# Patient Record
Sex: Female | Born: 1985 | Race: Black or African American | Hispanic: No | Marital: Single | Smoking: Never smoker
Health system: Southern US, Community
[De-identification: ages and names within clinical notes are randomized; demographics above are authoritative.]

## PROBLEM LIST (undated history)

## (undated) ENCOUNTER — Inpatient Hospital Stay (HOSPITAL_COMMUNITY): Payer: Self-pay

## (undated) DIAGNOSIS — A749 Chlamydial infection, unspecified: Secondary | ICD-10-CM

## (undated) DIAGNOSIS — R519 Headache, unspecified: Secondary | ICD-10-CM

## (undated) DIAGNOSIS — I10 Essential (primary) hypertension: Secondary | ICD-10-CM

## (undated) DIAGNOSIS — D649 Anemia, unspecified: Secondary | ICD-10-CM

## (undated) DIAGNOSIS — A539 Syphilis, unspecified: Secondary | ICD-10-CM

## (undated) DIAGNOSIS — A599 Trichomoniasis, unspecified: Secondary | ICD-10-CM

## (undated) DIAGNOSIS — R51 Headache: Secondary | ICD-10-CM

## (undated) HISTORY — PX: ABSCESS DRAINAGE: SHX1119

---
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
## 2005-05-17 ENCOUNTER — Emergency Department (HOSPITAL_COMMUNITY): Admission: EM | Admit: 2005-05-17 | Discharge: 2005-05-17 | Payer: Self-pay | Admitting: Emergency Medicine

## 2005-10-08 ENCOUNTER — Emergency Department (HOSPITAL_COMMUNITY): Admission: EM | Admit: 2005-10-08 | Discharge: 2005-10-08 | Payer: Self-pay | Admitting: Emergency Medicine

## 2005-10-24 ENCOUNTER — Ambulatory Visit (HOSPITAL_COMMUNITY): Admission: RE | Admit: 2005-10-24 | Discharge: 2005-10-24 | Payer: Self-pay | Admitting: General Surgery

## 2005-10-24 ENCOUNTER — Encounter (INDEPENDENT_AMBULATORY_CARE_PROVIDER_SITE_OTHER): Payer: Self-pay | Admitting: *Deleted

## 2006-03-08 ENCOUNTER — Ambulatory Visit: Payer: Self-pay | Admitting: Family Medicine

## 2006-03-27 ENCOUNTER — Emergency Department (HOSPITAL_COMMUNITY): Admission: EM | Admit: 2006-03-27 | Discharge: 2006-03-27 | Payer: Self-pay | Admitting: Emergency Medicine

## 2006-03-29 ENCOUNTER — Ambulatory Visit: Payer: Self-pay | Admitting: Family Medicine

## 2006-08-14 ENCOUNTER — Emergency Department (HOSPITAL_COMMUNITY): Admission: EM | Admit: 2006-08-14 | Discharge: 2006-08-14 | Payer: Self-pay | Admitting: Emergency Medicine

## 2006-09-25 ENCOUNTER — Emergency Department (HOSPITAL_COMMUNITY): Admission: EM | Admit: 2006-09-25 | Discharge: 2006-09-25 | Payer: Self-pay | Admitting: Emergency Medicine

## 2007-06-29 ENCOUNTER — Emergency Department (HOSPITAL_COMMUNITY): Admission: EM | Admit: 2007-06-29 | Discharge: 2007-06-29 | Payer: Self-pay | Admitting: Emergency Medicine

## 2007-12-27 ENCOUNTER — Emergency Department (HOSPITAL_COMMUNITY): Admission: EM | Admit: 2007-12-27 | Discharge: 2007-12-27 | Payer: Self-pay | Admitting: Emergency Medicine

## 2007-12-30 ENCOUNTER — Emergency Department (HOSPITAL_COMMUNITY): Admission: EM | Admit: 2007-12-30 | Discharge: 2007-12-30 | Payer: Self-pay | Admitting: Emergency Medicine

## 2008-01-23 ENCOUNTER — Emergency Department (HOSPITAL_COMMUNITY): Admission: EM | Admit: 2008-01-23 | Discharge: 2008-01-23 | Payer: Self-pay | Admitting: Family Medicine

## 2008-03-06 ENCOUNTER — Ambulatory Visit (HOSPITAL_COMMUNITY): Admission: RE | Admit: 2008-03-06 | Discharge: 2008-03-06 | Payer: Self-pay | Admitting: Obstetrics & Gynecology

## 2008-04-17 ENCOUNTER — Inpatient Hospital Stay (HOSPITAL_COMMUNITY): Admission: AD | Admit: 2008-04-17 | Discharge: 2008-04-17 | Payer: Self-pay | Admitting: Gynecology

## 2008-06-12 ENCOUNTER — Ambulatory Visit (HOSPITAL_COMMUNITY): Admission: RE | Admit: 2008-06-12 | Discharge: 2008-06-12 | Payer: Self-pay | Admitting: Family Medicine

## 2008-06-16 ENCOUNTER — Emergency Department (HOSPITAL_COMMUNITY): Admission: EM | Admit: 2008-06-16 | Discharge: 2008-06-16 | Payer: Self-pay | Admitting: Emergency Medicine

## 2008-07-02 ENCOUNTER — Emergency Department (HOSPITAL_COMMUNITY): Admission: EM | Admit: 2008-07-02 | Discharge: 2008-07-02 | Payer: Self-pay | Admitting: Emergency Medicine

## 2008-07-06 ENCOUNTER — Inpatient Hospital Stay (HOSPITAL_COMMUNITY): Admission: AD | Admit: 2008-07-06 | Discharge: 2008-07-06 | Payer: Self-pay | Admitting: Obstetrics & Gynecology

## 2008-07-11 ENCOUNTER — Ambulatory Visit: Payer: Self-pay | Admitting: Advanced Practice Midwife

## 2008-07-11 ENCOUNTER — Inpatient Hospital Stay (HOSPITAL_COMMUNITY): Admission: AD | Admit: 2008-07-11 | Discharge: 2008-07-11 | Payer: Self-pay | Admitting: Obstetrics and Gynecology

## 2008-08-07 ENCOUNTER — Ambulatory Visit: Payer: Self-pay | Admitting: Obstetrics & Gynecology

## 2008-08-07 ENCOUNTER — Ambulatory Visit: Payer: Self-pay | Admitting: Family

## 2008-08-07 ENCOUNTER — Inpatient Hospital Stay (HOSPITAL_COMMUNITY): Admission: AD | Admit: 2008-08-07 | Discharge: 2008-08-09 | Payer: Self-pay | Admitting: Obstetrics & Gynecology

## 2009-02-05 ENCOUNTER — Emergency Department (HOSPITAL_COMMUNITY): Admission: EM | Admit: 2009-02-05 | Discharge: 2009-02-05 | Payer: Self-pay | Admitting: Emergency Medicine

## 2009-03-12 ENCOUNTER — Emergency Department (HOSPITAL_COMMUNITY): Admission: EM | Admit: 2009-03-12 | Discharge: 2009-03-12 | Payer: Self-pay | Admitting: Emergency Medicine

## 2009-03-19 ENCOUNTER — Emergency Department (HOSPITAL_COMMUNITY): Admission: EM | Admit: 2009-03-19 | Discharge: 2009-03-19 | Payer: Self-pay | Admitting: Emergency Medicine

## 2009-04-16 ENCOUNTER — Emergency Department (HOSPITAL_COMMUNITY): Admission: EM | Admit: 2009-04-16 | Discharge: 2009-04-16 | Payer: Self-pay | Admitting: Emergency Medicine

## 2009-07-11 ENCOUNTER — Emergency Department (HOSPITAL_COMMUNITY): Admission: EM | Admit: 2009-07-11 | Discharge: 2009-07-11 | Payer: Self-pay | Admitting: Emergency Medicine

## 2009-07-24 ENCOUNTER — Emergency Department (HOSPITAL_COMMUNITY): Admission: EM | Admit: 2009-07-24 | Discharge: 2009-07-24 | Payer: Self-pay | Admitting: Emergency Medicine

## 2010-02-11 ENCOUNTER — Emergency Department (HOSPITAL_COMMUNITY)
Admission: EM | Admit: 2010-02-11 | Discharge: 2010-02-11 | Payer: Self-pay | Source: Home / Self Care | Admitting: Emergency Medicine

## 2010-02-24 ENCOUNTER — Emergency Department (HOSPITAL_COMMUNITY): Admission: EM | Admit: 2010-02-24 | Discharge: 2010-02-24 | Payer: Self-pay | Admitting: Emergency Medicine

## 2010-08-14 ENCOUNTER — Encounter: Payer: Self-pay | Admitting: Family Medicine

## 2010-09-07 ENCOUNTER — Emergency Department (HOSPITAL_COMMUNITY)
Admission: EM | Admit: 2010-09-07 | Discharge: 2010-09-07 | Disposition: A | Discharge status: Home / Self Care | Payer: Medicaid Other | Attending: Emergency Medicine | Admitting: Emergency Medicine

## 2010-09-07 DIAGNOSIS — M545 Low back pain, unspecified: Secondary | ICD-10-CM | POA: Insufficient documentation

## 2010-09-07 DIAGNOSIS — N390 Urinary tract infection, site not specified: Secondary | ICD-10-CM | POA: Insufficient documentation

## 2010-09-07 LAB — URINALYSIS, ROUTINE W REFLEX MICROSCOPIC
Ketones, ur: NEGATIVE mg/dL
Nitrite: NEGATIVE
Protein, ur: NEGATIVE mg/dL
Specific Gravity, Urine: 1.03 (ref 1.005–1.030)
Urine Glucose, Fasting: NEGATIVE mg/dL
Urobilinogen, UA: 0.2 mg/dL (ref 0.0–1.0)
pH: 6 (ref 5.0–8.0)

## 2010-09-07 LAB — URINE MICROSCOPIC-ADD ON

## 2010-09-07 LAB — POCT PREGNANCY, URINE: Preg Test, Ur: NEGATIVE

## 2010-10-07 LAB — POCT PREGNANCY, URINE: Preg Test, Ur: NEGATIVE

## 2010-10-08 LAB — POCT PREGNANCY, URINE: Preg Test, Ur: NEGATIVE

## 2010-10-08 LAB — POCT URINALYSIS DIP (DEVICE)
Bilirubin Urine: NEGATIVE
Glucose, UA: NEGATIVE mg/dL
Hgb urine dipstick: NEGATIVE
Ketones, ur: NEGATIVE mg/dL
Nitrite: POSITIVE — AB
Protein, ur: NEGATIVE mg/dL
Specific Gravity, Urine: 1.025 (ref 1.005–1.030)
Urobilinogen, UA: 0.2 mg/dL (ref 0.0–1.0)
pH: 6.5 (ref 5.0–8.0)

## 2010-10-08 LAB — POCT RAPID STREP A (OFFICE): Streptococcus, Group A Screen (Direct): NEGATIVE

## 2010-10-19 ENCOUNTER — Emergency Department (HOSPITAL_COMMUNITY)
Admission: EM | Admit: 2010-10-19 | Discharge: 2010-10-19 | Disposition: A | Discharge status: Home / Self Care | Payer: Medicaid Other | Attending: Emergency Medicine | Admitting: Emergency Medicine

## 2010-10-19 DIAGNOSIS — X58XXXA Exposure to other specified factors, initial encounter: Secondary | ICD-10-CM | POA: Insufficient documentation

## 2010-10-19 DIAGNOSIS — T148XXA Other injury of unspecified body region, initial encounter: Secondary | ICD-10-CM | POA: Insufficient documentation

## 2010-10-19 DIAGNOSIS — M549 Dorsalgia, unspecified: Secondary | ICD-10-CM | POA: Insufficient documentation

## 2010-10-19 LAB — URINALYSIS, ROUTINE W REFLEX MICROSCOPIC
Ketones, ur: NEGATIVE mg/dL
Nitrite: NEGATIVE
Protein, ur: NEGATIVE mg/dL
Specific Gravity, Urine: 1.014 (ref 1.005–1.030)
pH: 6.5 (ref 5.0–8.0)

## 2010-10-19 LAB — POCT PREGNANCY, URINE: Preg Test, Ur: NEGATIVE

## 2010-10-28 LAB — RAPID STREP SCREEN (MED CTR MEBANE ONLY): Streptococcus, Group A Screen (Direct): NEGATIVE

## 2010-11-07 LAB — COMPREHENSIVE METABOLIC PANEL
ALT: 10 U/L (ref 0–35)
AST: 21 U/L (ref 0–37)
Albumin: 2.5 g/dL — ABNORMAL LOW (ref 3.5–5.2)
Alkaline Phosphatase: 164 U/L — ABNORMAL HIGH (ref 39–117)
BUN: 7 mg/dL (ref 6–23)
CO2: 21 mEq/L (ref 19–32)
Calcium: 8.7 mg/dL (ref 8.4–10.5)
Chloride: 107 mEq/L (ref 96–112)
Creatinine, Ser: 0.53 mg/dL (ref 0.4–1.2)
GFR calc Af Amer: >60 mL/min (ref >60)
GFR calc non Af Amer: >60 mL/min (ref >60)
Glucose, Bld: 91 mg/dL (ref 70–99)
Potassium: 3.5 mEq/L (ref 3.5–5.1)
Sodium: 133 mEq/L — ABNORMAL LOW (ref 135–145)
Total Bilirubin: 0.2 mg/dL — ABNORMAL LOW (ref 0.3–1.2)
Total Protein: 6.4 g/dL (ref 6.0–8.3)

## 2010-11-07 LAB — CBC
HCT: 27.2 % — ABNORMAL LOW (ref 36.0–46.0)
HCT: 24 % — ABNORMAL LOW (ref 36.0–46.0)
Hemoglobin: 8.6 g/dL — ABNORMAL LOW (ref 12.0–15.0)
Hemoglobin: 7.8 g/dL — CL (ref 12.0–15.0)
MCHC: 31.5 g/dL (ref 30.0–36.0)
MCHC: 32.5 g/dL (ref 30.0–36.0)
MCV: 74.6 fL — ABNORMAL LOW (ref 78.0–100.0)
MCV: 74.8 fL — ABNORMAL LOW (ref 78.0–100.0)
Platelets: 350 10*3/uL (ref 150–400)
Platelets: 310 10*3/uL (ref 150–400)
RBC: 3.64 MIL/uL — ABNORMAL LOW (ref 3.87–5.11)
RBC: 3.21 MIL/uL — ABNORMAL LOW (ref 3.87–5.11)
RDW: 20.3 % — ABNORMAL HIGH (ref 11.5–15.5)
RDW: 20.6 % — ABNORMAL HIGH (ref 11.5–15.5)
WBC: 8.4 10*3/uL (ref 4.0–10.5)
WBC: 14.5 10*3/uL — ABNORMAL HIGH (ref 4.0–10.5)

## 2010-11-07 LAB — URINALYSIS, DIPSTICK ONLY
Bilirubin Urine: NEGATIVE
Glucose, UA: NEGATIVE mg/dL
Ketones, ur: NEGATIVE mg/dL
Nitrite: NEGATIVE
Protein, ur: NEGATIVE mg/dL
Specific Gravity, Urine: 1.01 (ref 1.005–1.030)
Urobilinogen, UA: 0.2 mg/dL (ref 0.0–1.0)
pH: 5.5 (ref 5.0–8.0)

## 2010-11-07 LAB — URIC ACID: Uric Acid, Serum: 5.4 mg/dL (ref 2.4–7.0)

## 2010-11-07 LAB — RPR: RPR Ser Ql: NONREACTIVE

## 2010-12-05 ENCOUNTER — Other Ambulatory Visit (HOSPITAL_COMMUNITY): Payer: Self-pay | Admitting: Internal Medicine

## 2010-12-09 ENCOUNTER — Ambulatory Visit (HOSPITAL_COMMUNITY)
Admission: RE | Admit: 2010-12-09 | Discharge: 2010-12-09 | Disposition: A | Discharge status: Home / Self Care | Payer: Medicaid Other | Source: Ambulatory Visit | Attending: Internal Medicine | Admitting: Internal Medicine

## 2010-12-09 DIAGNOSIS — R109 Unspecified abdominal pain: Secondary | ICD-10-CM | POA: Insufficient documentation

## 2010-12-09 NOTE — Discharge Summary (Signed)
NAME:  Amber Sanders, Amber Sanders                        ACCOUNT NO.:  [id number]   MEDICAL RECORD NO.:  [id number]                   PATIENT TYPE:  INP   LOCATION:  5736                                 FACILITY:  [facility]   PHYSICIAN:  Foye Clock, MD                   DATE OF BIRTH:  1986-06-23   DATE OF ADMISSION:  07/31/2003  DATE OF DISCHARGE:  08/05/2003                                 DISCHARGE SUMMARY   DISCHARGE DIAGNOSIS:  Gastroenteritis most likely secondary to [place]  virus.   DISCHARGE MEDICATIONS:  1. Mylanta, please use as directed for nausea.  2. Tylenol over the counter use as directed for any abdominal pain.  3. Phenergan 12.5 mg one p.o. q. 4 hours p.r.n. nausea.   PROCEDURES PERFORMED:  1. Abdominal CT done on August 01, 2003, which showed normal anatomy, no     evidence of any abnormalities.   CONSULTANTS:  None.   In brief, patient's history and physical exam is as follows.  Please see the  full H&P on the patient's medical record for all details.  But in brief, Ms.  Sanders is a 25 year old female with no past medical history who presented  initially to the emergency department on July 28, 2003, which is 3 days  prior to this admission with severe nausea and vomiting which began abruptly  that same day.  Since then the nausea and vomiting has persisted throughout  this time.  She has had no frank abdominal pain or any bowel abnormalities.  She has been vomiting she says at least 15 times.  She is unable to keep  down solids and because of this vomiting she has had increased fatigue and  weakness with some subjective fever.  Patient denies any sick contacts.  Has  had a normal bowel movement she states 3 days ago.  No dysuria, no vaginal  discharge.   PHYSICAL EXAMINATION:  VITAL SIGNS:  Temperature 98.1, pulse 72, respiratory  rate 16, blood pressure 132/72 lying down with a pulse as mentioned before  72, and sitting 138/70 with pulse of 70, and standing 120/95  with pulse of  94, and saturating 98% on room air.  GENERAL:  She is very weak, lying on her side, occasionally moaning and  gagging.  EYES:  Pupils equal, round, reactive to light and accommodation.  Extraocular movements are intact.  ENT:  Oropharynx clear.  Moist mucosal membranes.  NECK:  Supple with no lymphadenopathy.  RESPIRATORY:  Clear to auscultation bilaterally.  No crackles, no wheezes.  CV:  She has regular rate and rhythm.  No murmurs, rubs, or gallops.  GI:  Tender to the slightest palpation throughout.  Some right-sided  abdominal tenderness greater than left-sided.  Positive bowel sounds.  No  rebound.  EXTREMITIES:  No edema.  SKIN:  No rashes, no lesions, no bruises.  Patient has positive CVA  tenderness bilaterally.  Otherwise  she has normal LSK, able to move all  extremities.  NEURO:  She has no gross abnormalities.   ADMISSION LABORATORIES:  Sodium 140, potassium 2.9, chloride 101, CO2 28,  BUN 20, creatinine 0.8, glucose 84, calcium 9.7.  White blood cell count  11.1, hemoglobin 12.7, platelet count 312.  UA showed trace leukocyte  esterase and 30 mg/dL of protein with greater than 80 ketones, a few  bacteria, and 30-60 white blood cells.  Wet prep showed positive clue cells,  negative for any other abnormalities.  Urine pregnancy was negative.   HOSPITAL COURSE:  Problem 1.  Nausea and vomiting, most likely secondary to  gastroenteritis from [place] virus.  Abdominal CT was checked which was  found to be normal documenting that her nausea and vomiting was not  secondary to any structural abnormalities.  Patient was admitted for  supportive care where she was given IV fluids, made NPO until able to  tolerate fluids.  Because there was some tenderness to palpation on exam  that was concentrated on the right side, questionable if it was greater in  the lower quadrant, Surgery was consulted and Dr. Derrell Lolling came to see the  patient after getting the results of the  CT that did not show any evidence  of pancreatitis or any other surgical emergencies, Dr. Derrell Lolling followed the  patient for one day but decided it was okay for the patient to eat.  Once  the patient was tolerating liquids, Dr. Derrell Lolling decided patient did not  require any surgical intervention, therefore would not continue to see the  patient at that point.  We, however, her primary team, decided to continue  on with supportive care and she continued to progress her diet throughout  the hospital course and was continued also with IV fluids.  The day of  discharge the patient had been asymptomatic for 24 hours, tolerating solid  p.o.'s, and was up ambulating without difficulty.  The patient stated that  she was ready to go on the day of discharge and therefore it was felt that  her issues had completely resolved.   Problem 2.  Hypokalemia.  This is most likely secondary to the vomiting.  Patient's potassium was repleted IV and p.o. intake, and was resolved by the  time patient was discharged.   Problem 3.  Diarrhea.  During the hospital course the patient developed  diarrhea.  Patient had not been started on any antibiotics, however, labs  were checked which included a C. diff which was negative.  Patient was  treated symptomatically with Imodium.  However, because of the diarrhea was  thought to be secondary to her gastroenteritis and just supportive care was  given.  Patient's diarrhea had resolved by the time of discharge and no  further intervention was required.   DISCHARGE LABORATORIES:  Sodium 137, potassium 3.9, Cl 106, CO2 27, BUN 6,  creatinine 0.7, glucose 87, calcium 8.9.  Liver enzymes were normal.   DISPOSITION:  Patient was much improved at the time of discharge.  Patient  was given a note to take back to her school excusing her from July 29, 2003, through August 05, 2003, for her absence secondary to her illness.  Patient was also given prescription for Phenergan 12.5 mg  p.o. q. 4 hours  p.r.n. nausea to take for symptomatic relief.  Patient was also encouraged  to use any over the counter medicine that might give her some direct relief  including Mylanta, which had proven to  be effective during the  hospitalization and Tylenol, which proved to help with her abdominal pain  secondary to her vomiting.  Because the patient is a normally healthy female  without any past medical history, it is recommended that the patient follow  up with her pediatrician annually as she had been doing, if she has any  acute problems or just her regular visits.                                                Foye Clock, MD    JH/MEDQ  D:  08/05/2003  T:  08/06/2003  Job:  [id number]

## 2010-12-09 NOTE — Op Note (Signed)
NAMEXOIE, KREUSER              ACCOUNT NO.:  [id number]   MEDICAL RECORD NO.:  [id number]          PATIENT TYPE:  AMB   LOCATION:  DAY                          FACILITY:  [facility]   PHYSICIAN:  Timothy E. Earlene Plater, M.D. DATE OF BIRTH:  06/23/1986   DATE OF PROCEDURE:  10/24/2005  DATE OF DISCHARGE:                                 OPERATIVE REPORT   PREOPERATIVE DIAGNOSIS:  Bilateral chronic axillary hidradenitis.   POSTOPERATIVE DIAGNOSIS:  Bilateral chronic axillary hidradenitis.   PROCEDURE:  Bilateral axillary debridement, incision, drainage and  culturing.   SURGEON:  Timothy E. Earlene Plater, M.D.   ANESTHESIA:  General.   Ms. Sliker is a 25 year old [place] woman with chronic hidradenitis,  bilateral.  The patient does not remember when she did not have draining  sores in the underarms.  She was referred to my office this past month from  the [facility].  She is 10 months postpartum and she feels as  though the drainage, odor and pain are getting worse.  She has been seen in  the office.  She had a light debridement and cultures made, put on  antibiotics, first doxycycline and then Keflex.  Cultures did not show MRSA.  She is prepared for this surgery and understands.  It is an unfortunate  patient and a complicated problem.  She was seen, identified, and the permit  signed.   She was taken to the operating room and placed supine, general endotracheal  anesthesia administered.  Both arms were gently outstretched and the entire  chest and axillae, neck and arms were prepped and draped in the usual  fashion.  I approached first the right and then the left and both axillae  were treated in similar fashions.  The superficial granulation tissue was  scraped away, tunnels fistulae were defined.  The chronic inflamed skin  overlying the largest of the fistulae was incised.  There was abundance of  pustular and waxy sebaceous-type material under the skin bridges.  I did  preserve as much normal skin as possible, and I cauterized the areas that  were oozing.  Cultures were made from each axilla separately and the skin  excisions were submitted separately.   Finally the wounds were under control.  A Vaseline gauze and then Ace wrap  were placed for security.  She tolerated it well, counts correct.  No  complications.   Written and verbal instructions given.  She is to continue her Keflex, which  she has, Vicodin #48 were given for pain.  We will see case worker and home  health care for assistance, and she will be followed closely in the office.      Timothy E. Earlene Plater, M.D.  Electronically Signed     TED/MEDQ  D:  10/24/2005  T:  10/25/2005  Job:  [id number]

## 2010-12-09 NOTE — Consult Note (Signed)
NAME:  Amber Sanders, Amber Sanders                        ACCOUNT NO.:  [id number]   MEDICAL RECORD NO.:  [id number]                   PATIENT TYPE:  INP   LOCATION:  6703                                 FACILITY:  [facility]   PHYSICIAN:  Angelia Mould. Derrell Lolling, M.D.             DATE OF BIRTH:  1986-04-25   DATE OF CONSULTATION:  07/31/2003  DATE OF DISCHARGE:                                   CONSULTATION   REFERRING PHYSICIAN:  Cliffton Asters, M.D.   REASON FOR CONSULTATION:  Evaluate abdominal pain and vomiting.   HISTORY OF PRESENT ILLNESS:  This is a 25 year old black female who reports  a four-day history of diffuse abdominal pain and repeated episodes of nausea  and vomiting.  After breakfast on Tuesday, four days ago, she started  vomiting and very shortly after that developed diffuse abdominal pain.  She  states that the pain has never been localizing.  It has been continuous  since that.  She has been vomiting every day.  She went to the [facility]  Emergency Room on Tuesday, July 28, 2003.  She states she had x-rays and  laboratories and was given a prescription for Phenergan and went home.  She  continued to be ill with diffuse pain and vomiting.  She came back today and  was admitted by the medical teaching service.   It is notable that she has been constipated.  She has not had any diarrhea.  She has not seen any blood in her stools or in her emesis.  She has had no  menstrual irregularities.  She is currently on her menstrual period now and  has not had any irregularities.  She has had no urinary frequency or  dysuria.   PAST MEDICAL HISTORY:  She has no medical or surgical problems.   MEDICATIONS:  She takes no medications.   ALLERGIES:  She has no drug allergies.   REVIEW OF SYSTEMS:  HEENT:  She does admit to a sore throat, but denies  earache or nose bleed.  CARDIOVASCULAR:  She does have a little bit of  shortness of breath, but denies orthopnea, edema, syncope, or palpitations.  RESPIRATORY:  She has had a little bit of a cough and sputum production  recently, but not much.  GASTROINTESTINAL:  Please see the history of  present illness.   PHYSICAL EXAMINATION:  GENERAL APPEARANCE:  A pleasant, healthy-appearing,  young, black female in moderate distress.  She has a Panda tube in place,  receiving contrast for her CT scan.  She is gagging a lot, but is not  vomiting.  VITAL SIGNS:  The temperature is 98.1 degrees, blood pressure 131/72, pulse  72, and respiratory rate 16.  NECK:  Supple.  No adenopathy.  No mass.  No jugular venous distention.  No  tenderness.  RESPIRATORY:  Lungs are clear to auscultation.  She does have tenderness to  percussion in both of her costovertebral  angles, however.  HEART:  Regular rate and rhythm.  No murmur.  ABDOMEN:  She is not distended.  Her abdomen is soft, but she has diffuse  tenderness throughout.  Bowel sounds are present, but hypoactive.  There is  no guarding or rebound.  I do not think she has any peritoneal signs.  The  liver and spleen are not enlarged.  I do not feel any abdominal mass nor do  I feel a hernia.  PELVIC:  Not repeated.  Earlier today it was said to be normal, except for  clue cells.  There was no discharge or notable cervical motion tenderness.  EXTREMITIES:  She moves all four extremities well without pain or deformity.   LABORATORY WORK:  Hemoglobin 12.7, white count 11,100.  Comprehensive  metabolic panel normal, except for a potassium of 2.5.  Urine pregnancy test  negative.   ASSESSMENT:  Diffuse abdominal pain and vomiting, etiology unclear.  The  long duration of her pain and the diffuse nature of her pain are somewhat  atypical for appendicitis.  I wonder whether she has some type of viral  syndrome, gynecologic problem, or more obscure GI disease.   PLAN:  1. There is no indication for surgical intervention immediately.  2. I agree with careful workup, including CT scan of the abdomen and  pelvis     to rule out focal inflammatory process.  3. I will follow along with you.                                               Angelia Mould. Derrell Lolling, M.D.    HMI/MEDQ  D:  07/31/2003  T:  08/01/2003  Job:  [id number]

## 2011-02-02 ENCOUNTER — Emergency Department (HOSPITAL_COMMUNITY): Payer: Medicaid Other

## 2011-02-02 ENCOUNTER — Emergency Department (HOSPITAL_COMMUNITY)
Admission: EM | Admit: 2011-02-02 | Discharge: 2011-02-02 | Disposition: A | Discharge status: Home / Self Care | Payer: Medicaid Other | Attending: Emergency Medicine | Admitting: Emergency Medicine

## 2011-02-02 DIAGNOSIS — R1012 Left upper quadrant pain: Secondary | ICD-10-CM | POA: Insufficient documentation

## 2011-02-02 DIAGNOSIS — M549 Dorsalgia, unspecified: Secondary | ICD-10-CM | POA: Insufficient documentation

## 2011-02-02 DIAGNOSIS — R109 Unspecified abdominal pain: Secondary | ICD-10-CM | POA: Insufficient documentation

## 2011-02-02 LAB — URINALYSIS, ROUTINE W REFLEX MICROSCOPIC
Bilirubin Urine: NEGATIVE
Specific Gravity, Urine: 1.025 (ref 1.005–1.030)
pH: 6 (ref 5.0–8.0)

## 2011-02-02 LAB — CBC
MCHC: 32.4 g/dL (ref 30.0–36.0)
RDW: 15.2 % (ref 11.5–15.5)
WBC: 9.1 10*3/uL (ref 4.0–10.5)

## 2011-02-02 LAB — URINE MICROSCOPIC-ADD ON

## 2011-02-02 LAB — COMPREHENSIVE METABOLIC PANEL
ALT: 17 U/L (ref 0–35)
AST: 18 U/L (ref 0–37)
Albumin: 3.1 g/dL — ABNORMAL LOW (ref 3.5–5.2)
Alkaline Phosphatase: 103 U/L (ref 39–117)
Chloride: 105 mEq/L (ref 96–112)
Potassium: 4 mEq/L (ref 3.5–5.1)
Sodium: 138 mEq/L (ref 135–145)
Total Bilirubin: 0.3 mg/dL (ref 0.3–1.2)
Total Protein: 7.5 g/dL (ref 6.0–8.3)

## 2011-02-02 LAB — DIFFERENTIAL
Basophils Absolute: 0 10*3/uL (ref 0.0–0.1)
Basophils Relative: 0 % (ref 0–1)
Eosinophils Relative: 3 % (ref 0–5)
Lymphocytes Relative: 22 % (ref 12–46)
Monocytes Absolute: 0.3 10*3/uL (ref 0.1–1.0)
Neutro Abs: 6.6 10*3/uL (ref 1.7–7.7)

## 2011-02-02 LAB — GLUCOSE, CAPILLARY: Glucose-Capillary: 88 mg/dL (ref 70–99)

## 2011-04-20 LAB — URINE CULTURE: Colony Count: 80000

## 2011-04-20 LAB — POCT URINALYSIS DIP (DEVICE)
Glucose, UA: NEGATIVE
Nitrite: NEGATIVE
Operator id: 247071
pH: 7

## 2011-04-20 LAB — POCT PREGNANCY, URINE
Operator id: 247071
Preg Test, Ur: POSITIVE

## 2011-04-24 LAB — URINE CULTURE: Colony Count: >=100000

## 2011-04-24 LAB — URINALYSIS, ROUTINE W REFLEX MICROSCOPIC
Bilirubin Urine: NEGATIVE
Glucose, UA: NEGATIVE
Protein, ur: NEGATIVE

## 2011-04-24 LAB — URINE MICROSCOPIC-ADD ON

## 2011-04-24 LAB — CBC
MCV: 85.2
RBC: 3.59 — ABNORMAL LOW
WBC: 12.1 — ABNORMAL HIGH

## 2011-04-28 LAB — CBC
MCHC: 31.8 g/dL (ref 30.0–36.0)
RDW: 18.1 % — ABNORMAL HIGH (ref 11.5–15.5)

## 2011-04-28 LAB — DIFFERENTIAL
Eosinophils Absolute: 0.2 10*3/uL (ref 0.0–0.7)
Eosinophils Relative: 2 % (ref 0–5)
Lymphs Abs: 2.1 10*3/uL (ref 0.7–4.0)
Monocytes Relative: 5 % (ref 3–12)

## 2011-04-28 LAB — URINALYSIS, ROUTINE W REFLEX MICROSCOPIC
Glucose, UA: NEGATIVE mg/dL
Hgb urine dipstick: NEGATIVE
Ketones, ur: NEGATIVE mg/dL
Protein, ur: NEGATIVE mg/dL

## 2011-04-28 LAB — COMPREHENSIVE METABOLIC PANEL
ALT: 9 U/L (ref 0–35)
AST: 18 U/L (ref 0–37)
Calcium: 9.2 mg/dL (ref 8.4–10.5)
GFR calc Af Amer: >60 mL/min (ref >60)
Sodium: 137 mEq/L (ref 135–145)
Total Protein: 6.1 g/dL (ref 6.0–8.3)

## 2011-06-10 ENCOUNTER — Emergency Department (HOSPITAL_COMMUNITY)
Admission: EM | Admit: 2011-06-10 | Discharge: 2011-06-10 | Disposition: A | Discharge status: Home / Self Care | Payer: Self-pay | Attending: Emergency Medicine | Admitting: Emergency Medicine

## 2011-06-10 ENCOUNTER — Encounter: Payer: Self-pay | Admitting: *Deleted

## 2011-06-10 DIAGNOSIS — R51 Headache: Secondary | ICD-10-CM | POA: Insufficient documentation

## 2011-06-10 DIAGNOSIS — R05 Cough: Secondary | ICD-10-CM | POA: Insufficient documentation

## 2011-06-10 DIAGNOSIS — R059 Cough, unspecified: Secondary | ICD-10-CM | POA: Insufficient documentation

## 2011-06-10 DIAGNOSIS — R6889 Other general symptoms and signs: Secondary | ICD-10-CM | POA: Insufficient documentation

## 2011-06-10 DIAGNOSIS — J3489 Other specified disorders of nose and nasal sinuses: Secondary | ICD-10-CM | POA: Insufficient documentation

## 2011-06-10 MED ORDER — IBUPROFEN 800 MG PO TABS: 800.0000 mg | ORAL_TABLET | Freq: Three times a day (TID) | ORAL | Status: AC

## 2011-06-10 MED ORDER — SALINE NASAL SPRAY 0.65 % NA SOLN: 1.0000 | NASAL | Status: DC | PRN

## 2011-06-10 MED ORDER — OXYMETAZOLINE HCL 0.05 % NA SOLN: 2.0000 | Freq: Two times a day (BID) | NASAL | Status: AC

## 2011-06-10 MED ORDER — KETOROLAC TROMETHAMINE 30 MG/ML IJ SOLN
30.0000 mg | Freq: Once | INTRAMUSCULAR | Status: AC
  Administered 2011-06-10: 30 mg via INTRAMUSCULAR
  Filled 2011-06-10: qty 1

## 2011-06-10 NOTE — ED Notes (Signed)
Pt has had HA for the past 2 days, pt states no history of migraines. Pt states that the pain is right sided. Pt denies sensitivity to light. Pt states that she has been taking Ibuprofen with no relief.

## 2011-06-10 NOTE — ED Provider Notes (Signed)
History     CSN: [id number] Arrival date & time: 06/10/2011  6:44 PM   First MD Initiated Contact with Patient 06/10/11 1957      Chief Complaint  Patient presents with  . Headache    (Consider location/radiation/quality/duration/timing/severity/associated sxs/prior treatment) Patient is a 25 y.o. female presenting with headaches. The history is provided by the patient.  Headache  This is a new problem. The current episode started 12 to 24 hours ago. The problem occurs constantly. The problem has not changed since onset.The headache is associated with nothing. The pain is located in the right unilateral region. The pain does not radiate. Pertinent negatives include no fever, no nausea and no vomiting. She has tried nothing for the symptoms.  Recent URI sx for the past several days. No fevers. Denies hx of migraine/headaches.  History reviewed. No pertinent past medical history.  History reviewed. No pertinent past surgical history.  History reviewed. No pertinent family history.  History  Substance Use Topics  . Smoking status: Never Smoker   . Smokeless tobacco: Not on file  . Alcohol Use: No    OB History    Grav Para Term Preterm Abortions TAB SAB Ect Mult Living                  Review of Systems  Constitutional: Negative for fever, chills and appetite change.  HENT: Positive for congestion, rhinorrhea, sneezing and sinus pressure. Negative for sore throat, facial swelling, neck pain, neck stiffness, postnasal drip and tinnitus.   Eyes: Negative for photophobia, pain and visual disturbance.  Respiratory: Positive for cough.   Cardiovascular: Negative for chest pain.  Gastrointestinal: Negative for nausea, vomiting and abdominal pain.  Musculoskeletal: Negative for myalgias.  Skin: Negative for color change and rash.  Neurological: Positive for headaches. Negative for dizziness, weakness and numbness.    Allergies  Review of patient's allergies indicates no  known allergies.  Home Medications  No current outpatient prescriptions on file.  BP 144/86  Pulse 73  Temp(Src) 97.8 F (36.6 C) (Oral)  Resp 18  SpO2 99%  Physical Exam  Nursing note and vitals reviewed. Constitutional: She is oriented to person, place, and time. She appears well-developed and well-nourished. No distress.  HENT:  Head: Normocephalic and atraumatic.  Right Ear: Tympanic membrane and external ear normal.  Left Ear: Tympanic membrane and external ear normal.  Mouth/Throat: Oropharynx is clear and moist.  Eyes: Conjunctivae and EOM are normal. Pupils are equal, round, and reactive to light.  Fundoscopic exam:      The right eye shows no hemorrhage and no papilledema.       The left eye shows no hemorrhage and no papilledema.  Neck: Normal range of motion.  Cardiovascular: Normal rate and regular rhythm.   Pulmonary/Chest: Effort normal and breath sounds normal.  Abdominal: Soft. Bowel sounds are normal. There is no tenderness.  Musculoskeletal: Normal range of motion.  Neurological: She is alert and oriented to person, place, and time. No cranial nerve deficit. Coordination normal.  Skin: Skin is warm and dry. No rash noted. She is not diaphoretic.  Psychiatric: She has a normal mood and affect.    ED Course  Procedures (including critical care time)  Labs Reviewed - No data to display No results found.   1. Sinus headache       MDM  Suspect this is most likely related to URI sx. Pt got some relief with toradol. Will send home with rx for ibuprofen as  well as Afrin to help with congestion.        Grant Fontana, [place] 06/11/11 2232

## 2011-06-10 NOTE — ED Notes (Signed)
C/o right sided headache that started around 3am and now she feels dizzy.   Neuro intact.

## 2011-06-12 NOTE — ED Provider Notes (Signed)
Medical screening examination/treatment/procedure(s) were performed by non-physician practitioner and as supervising physician I was immediately available for consultation/collaboration.  Flint Melter, MD 06/12/11 [phone]

## 2011-06-26 ENCOUNTER — Emergency Department (HOSPITAL_COMMUNITY)
Admission: EM | Admit: 2011-06-26 | Discharge: 2011-06-26 | Disposition: A | Discharge status: Home / Self Care | Payer: Self-pay | Attending: Emergency Medicine | Admitting: Emergency Medicine

## 2011-06-26 ENCOUNTER — Encounter (HOSPITAL_COMMUNITY): Payer: Self-pay

## 2011-06-26 DIAGNOSIS — M5430 Sciatica, unspecified side: Secondary | ICD-10-CM | POA: Insufficient documentation

## 2011-06-26 DIAGNOSIS — S335XXA Sprain of ligaments of lumbar spine, initial encounter: Secondary | ICD-10-CM | POA: Insufficient documentation

## 2011-06-26 DIAGNOSIS — X500XXA Overexertion from strenuous movement or load, initial encounter: Secondary | ICD-10-CM | POA: Insufficient documentation

## 2011-06-26 DIAGNOSIS — M5380 Other specified dorsopathies, site unspecified: Secondary | ICD-10-CM | POA: Insufficient documentation

## 2011-06-26 DIAGNOSIS — S39012A Strain of muscle, fascia and tendon of lower back, initial encounter: Secondary | ICD-10-CM

## 2011-06-26 MED ORDER — PREDNISONE 50 MG PO TABS: 50.0000 mg | ORAL_TABLET | Freq: Every day | ORAL | Status: AC

## 2011-06-26 MED ORDER — HYDROCODONE-ACETAMINOPHEN 5-325 MG PO TABS: 1.0000 | ORAL_TABLET | Freq: Four times a day (QID) | ORAL | Status: AC | PRN

## 2011-06-26 MED ORDER — DIAZEPAM 5 MG PO TABS: 5.0000 mg | ORAL_TABLET | Freq: Three times a day (TID) | ORAL | Status: AC | PRN

## 2011-06-26 NOTE — ED Notes (Signed)
No complaints at present. Voices understanding of instructions given. Walked to check out window.  

## 2011-06-26 NOTE — ED Provider Notes (Signed)
Medical screening examination/treatment/procedure(s) were performed by non-physician practitioner and as supervising physician I was immediately available for consultation/collaboration.   Lyanne Co, MD 06/26/11 [phone]

## 2011-06-26 NOTE — ED Provider Notes (Signed)
History     CSN: [id number] Arrival date & time: 06/26/2011 10:47 AM   First MD Initiated Contact with Patient 06/26/11 1128      Chief Complaint  Patient presents with  . Back Pain    (Consider location/radiation/quality/duration/timing/severity/associated sxs/prior treatment) Patient is a 25 y.o. female presenting with back pain. The history is provided by the patient.  Back Pain  Pertinent negatives include no chest pain, no fever, no numbness, no abdominal pain, no bowel incontinence, no perianal numbness, no bladder incontinence, no dysuria, no pelvic pain, no paresthesias, no tingling and no weakness.   Patient states, that on Friday.  She was bending over to pick up her son's jacket and when she stood back up her back pop, and she has had spasm and pain in her right lower back since that time.  She states the pain radiates to her right lower extremity.  She denies incontinence of bowel or bladder, numbness, abdominal pain, vaginal discharge or weakness of her legs.  The patient does not have any urinary symptoms. Patient states, that she has had this happen in the past with muscle spasms in her lower back.  Patient denies any inability to walk.       History reviewed. No pertinent past medical history.  History reviewed. No pertinent past surgical history.  No family history on file.  History  Substance Use Topics  . Smoking status: Never Smoker   . Smokeless tobacco: Not on file  . Alcohol Use: No    OB History    Grav Para Term Preterm Abortions TAB SAB Ect Mult Living                  Review of Systems  Constitutional: Negative for fever.  Cardiovascular: Negative for chest pain.  Gastrointestinal: Negative for abdominal pain and bowel incontinence.  Genitourinary: Negative for bladder incontinence, dysuria and pelvic pain.  Musculoskeletal: Positive for back pain.  Neurological: Negative for tingling, weakness, numbness and paresthesias.   All pertinent  positives and negatives in the history of present illness  Allergies  Review of patient's allergies indicates no known allergies.  Home Medications   Current Outpatient Rx  Name Route Sig Dispense Refill  . SALINE NASAL SPRAY 0.65 % NA SOLN Nasal Place 1 spray into the nose as needed for congestion. 30 mL 0    BP 119/79  Pulse 85  Temp(Src) 98.1 F (36.7 C) (Oral)  Resp 18  Ht 5\' 6"  (1.676 m)  Wt 270 lb (122.471 kg)  BMI 43.58 kg/m2  SpO2 100%  LMP 06/26/2011  Physical Exam  Constitutional: She appears well-nourished. No distress.  HENT:  Head: Normocephalic and atraumatic.  Eyes: Pupils are equal, round, and reactive to light.  Neck: Normal range of motion. Neck supple.  Cardiovascular: Normal rate, regular rhythm and normal heart sounds.   Pulmonary/Chest: Effort normal and breath sounds normal. She has no wheezes. She has no rales.  Musculoskeletal:       Lumbar back: She exhibits spasm. She exhibits no deformity.       Back:  Neurological: She is alert. She has normal strength. She displays normal reflexes. No sensory deficit. Coordination and gait normal.  Reflex Scores:      Patellar reflexes are 2+ on the right side.      Achilles reflexes are 2+ on the right side.      Patient is able to ambulate without difficulty    ED Course  Procedures (including critical care  time)   Patient has no neurological deficits and normal reflexes on examination.  She will be given pain control and advised to use ice and heat are lower back.  She is told to return here for any worsening in her condition.  This appears to be lumbar strain with sciatic  -type features.  The patient had no significant trauma therefore no x-rays were obtained due to this fact.  MDM  See above        Carlyle Dolly, PA 06/26/11 1150

## 2011-06-26 NOTE — ED Notes (Signed)
Patient reports lower backpain that started on Friday after picking up childs coat off ground, pain with any change in position

## 2011-06-26 NOTE — ED Notes (Signed)
States that she leaned over to pick up her son's coat off the floor and felt a pulling sensation in her back. States that the pain in gradually worse with pains shooting down her leg.

## 2011-09-15 ENCOUNTER — Emergency Department (HOSPITAL_COMMUNITY)
Admission: EM | Admit: 2011-09-15 | Discharge: 2011-09-15 | Disposition: A | Discharge status: Home / Self Care | Payer: Self-pay | Attending: Emergency Medicine | Admitting: Emergency Medicine

## 2011-09-15 ENCOUNTER — Encounter (HOSPITAL_COMMUNITY): Payer: Self-pay

## 2011-09-15 ENCOUNTER — Emergency Department (HOSPITAL_COMMUNITY): Payer: Self-pay

## 2011-09-15 DIAGNOSIS — H53149 Visual discomfort, unspecified: Secondary | ICD-10-CM | POA: Insufficient documentation

## 2011-09-15 DIAGNOSIS — J329 Chronic sinusitis, unspecified: Secondary | ICD-10-CM

## 2011-09-15 DIAGNOSIS — E669 Obesity, unspecified: Secondary | ICD-10-CM | POA: Insufficient documentation

## 2011-09-15 DIAGNOSIS — Z6841 Body Mass Index (BMI) 40.0 and over, adult: Secondary | ICD-10-CM | POA: Insufficient documentation

## 2011-09-15 DIAGNOSIS — R51 Headache: Secondary | ICD-10-CM

## 2011-09-15 HISTORY — DX: Headache, unspecified: R51.9

## 2011-09-15 HISTORY — DX: Headache: R51

## 2011-09-15 MED ORDER — TRAMADOL HCL 50 MG PO TABS: 50.0000 mg | ORAL_TABLET | Freq: Four times a day (QID) | ORAL | Status: AC | PRN

## 2011-09-15 MED ORDER — AZITHROMYCIN 500 MG PO TABS: 500.0000 mg | ORAL_TABLET | Freq: Every day | ORAL | Status: AC

## 2011-09-15 MED ORDER — PSEUDOEPHEDRINE HCL 60 MG PO TABS: 60.0000 mg | ORAL_TABLET | Freq: Four times a day (QID) | ORAL | Status: AC | PRN

## 2011-09-15 MED ORDER — PROCHLORPERAZINE EDISYLATE 5 MG/ML IJ SOLN
10.0000 mg | INTRAMUSCULAR | Status: AC
  Administered 2011-09-15: 10 mg via INTRAMUSCULAR
  Filled 2011-09-15 (×2): qty 2

## 2011-09-15 NOTE — ED Notes (Signed)
Patient returned from CT

## 2011-09-15 NOTE — Discharge Instructions (Signed)
Headache, General, Unknown Cause The specific cause of your headache may not have been found today. There are many causes and types of headache. A few common ones are:  Tension headache.   Migraine.   Infections (examples: dental and sinus infections).   Bone and/or joint problems in the neck or jaw.   Depression.   Eye problems.  These headaches are not life threatening.  Headaches can sometimes be diagnosed by a patient history and a physical exam. Sometimes, lab and imaging studies (such as x-ray and/or CT scan) are used to rule out more serious problems. In some cases, a spinal tap (lumbar puncture) may be requested. There are many times when your exam and tests may be normal on the first visit even when there is a serious problem causing your headaches. Because of that, it is very important to follow up with your doctor or local clinic for further evaluation. FINDING OUT THE RESULTS OF TESTS  If a radiology test was performed, a radiologist will review your results.   You will be contacted by the emergency department or your physician if any test results require a change in your treatment plan.   Not all test results may be available during your visit. If your test results are not back during the visit, make an appointment with your caregiver to find out the results. Do not assume everything is normal if you have not heard from your caregiver or the medical facility. It is important for you to follow up on all of your test results.  HOME CARE INSTRUCTIONS   Keep follow-up appointments with your caregiver, or any specialist referral.   Only take over-the-counter or prescription medicines for pain, discomfort, or fever as directed by your caregiver.   Biofeedback, massage, or other relaxation techniques may be helpful.   Ice packs or heat applied to the head and neck can be used. Do this three to four times per day, or as needed.   Call your doctor if you have any questions or  concerns.   If you smoke, you should quit.  SEEK MEDICAL CARE IF:   You develop problems with medications prescribed.   You do not respond to or obtain relief from medications.   You have a change from the usual headache.   You develop nausea or vomiting.  SEEK IMMEDIATE MEDICAL CARE IF:   If your headache becomes severe.   You have an unexplained oral temperature above 102 F (38.9 C), or as your caregiver suggests.   You have a stiff neck.   You have loss of vision.   You have muscular weakness.   You have loss of muscular control.   You develop severe symptoms different from your first symptoms.   You start losing your balance or have trouble walking.   You feel faint or pass out.  MAKE SURE YOU:   Understand these instructions.   Will watch your condition.   Will get help right away if you are not doing well or get worse.  Document Released: 07/10/2005 Document Revised: 03/22/2011 Document Reviewed: 02/27/2008 [facility] Patient Information 2012 [place], [place].Sinusitis Sinuses are air pockets within the bones of your face. The growth of bacteria within a sinus leads to infection. The infection prevents the sinuses from draining. This infection is called sinusitis. SYMPTOMS  There will be different areas of pain depending on which sinuses have become infected.  The maxillary sinuses often produce pain beneath the eyes.   Frontal sinusitis may cause pain  in the middle of the forehead and above the eyes.  Other problems (symptoms) include:  Toothaches.   Colored, pus-like (purulent) drainage from the nose.   Swelling, warmth, and tenderness over the sinus areas may be signs of infection.  TREATMENT  Sinusitis is most often determined by an exam.X-rays may be taken. If x-rays have been taken, make sure you obtain your results or find out how you are to obtain them. Your caregiver may give you medications (antibiotics). These are medications that will help kill  the bacteria causing the infection. You may also be given a medication (decongestant) that helps to reduce sinus swelling.  HOME CARE INSTRUCTIONS   Only take over-the-counter or prescription medicines for pain, discomfort, or fever as directed by your caregiver.   Drink extra fluids. Fluids help thin the mucus so your sinuses can drain more easily.   Applying either moist heat or ice packs to the sinus areas may help relieve discomfort.   Use saline nasal sprays to help moisten your sinuses. The sprays can be found at your local drugstore.  SEEK IMMEDIATE MEDICAL CARE IF:  You have a fever.   You have increasing pain, severe headaches, or toothache.   You have nausea, vomiting, or drowsiness.   You develop unusual swelling around the face or trouble seeing.  MAKE SURE YOU:   Understand these instructions.   Will watch your condition.   Will get help right away if you are not doing well or get worse.  Document Released: 07/10/2005 Document Revised: 03/22/2011 Document Reviewed: 02/06/2007 [facility] Patient Information 2012 [place], [place].

## 2011-09-15 NOTE — ED Provider Notes (Signed)
History     CSN: [id number]  Arrival date & time 09/15/11  1046   First MD Initiated Contact with Patient 09/15/11 1116      Chief Complaint  Patient presents with  . Headache    (Consider location/radiation/quality/duration/timing/severity/associated sxs/prior treatment) Patient is a 26 y.o. female presenting with headaches. The history is provided by the patient.  Headache  This is a new problem. Pertinent negatives include no shortness of breath, no nausea and no vomiting.   patient states she's had headaches on the right side her head the last 2 weeks. She states it is constant nagging headache. It comes and goes. Sores on the right side. No nauseous vomiting. She states noises and light bothers her. No fevers. No trauma. No visual changes. No trouble hearing. She states she's had sinus problems for, but this does not feel like that. No weight loss. She denies possibility of pregnancy.  Past Medical History  Diagnosis Date  . Sinus headache     History reviewed. No pertinent past surgical history.  No family history on file.  History  Substance Use Topics  . Smoking status: Never Smoker   . Smokeless tobacco: Not on file  . Alcohol Use: No    OB History    Grav Para Term Preterm Abortions TAB SAB Ect Mult Living                  Review of Systems  Constitutional: Negative for activity change and appetite change.  HENT: Negative for neck stiffness.   Eyes: Positive for photophobia. Negative for pain.  Respiratory: Negative for chest tightness and shortness of breath.   Cardiovascular: Negative for chest pain and leg swelling.  Gastrointestinal: Negative for nausea, vomiting, abdominal pain and diarrhea.  Genitourinary: Negative for flank pain.  Musculoskeletal: Negative for back pain.  Skin: Negative for rash.  Neurological: Positive for headaches. Negative for weakness and numbness.  Psychiatric/Behavioral: Negative for behavioral problems.    Allergies    Review of patient's allergies indicates no known allergies.  Home Medications   Current Outpatient Rx  Name Route Sig Dispense Refill  . BC HEADACHE POWDER PO Oral Take 1 packet by mouth every 6 (six) hours as needed. For pain    . SALINE NASAL SPRAY 0.65 % NA SOLN Nasal Place 1 spray into the nose as needed for congestion. 30 mL 0  . AZITHROMYCIN 500 MG PO TABS Oral Take 1 tablet (500 mg total) by mouth daily. 3 tablet 0  . PSEUDOEPHEDRINE HCL 60 MG PO TABS Oral Take 1 tablet (60 mg total) by mouth every 6 (six) hours as needed for congestion. 10 tablet 0  . TRAMADOL HCL 50 MG PO TABS Oral Take 1 tablet (50 mg total) by mouth every 6 (six) hours as needed for pain. 15 tablet 0    BP 141/83  Pulse 91  Temp(Src) 97.9 F (36.6 C) (Oral)  Resp 22  Ht 5\' 6"  (1.676 m)  Wt 270 lb (122.471 kg)  BMI 43.58 kg/m2  SpO2 100%  LMP 08/13/2011  Physical Exam  Nursing note and vitals reviewed. Constitutional: She is oriented to person, place, and time. She appears well-developed and well-nourished.       Patient is obese  HENT:  Head: Normocephalic and atraumatic.  Mouth/Throat: No oropharyngeal exudate.       Bilateral interior ear canals have slight pinkness. It is symmetric bilaterally.  Eyes: EOM are normal. Pupils are equal, round, and reactive to light.  Neck: Normal range of motion. Neck supple.  Cardiovascular: Normal rate, regular rhythm and normal heart sounds.   No murmur heard. Pulmonary/Chest: Effort normal and breath sounds normal. No respiratory distress. She has no wheezes. She has no rales.  Abdominal: Soft. Bowel sounds are normal. She exhibits no distension. There is no tenderness. There is no rebound and no guarding.  Musculoskeletal: Normal range of motion.  Neurological: She is alert and oriented to person, place, and time. No cranial nerve deficit.  Skin: Skin is warm and dry.  Psychiatric: She has a normal mood and affect. Her speech is normal.    ED Course   Procedures (including critical care time)  Labs Reviewed - No data to display Ct Head Wo Contrast  09/15/2011  *RADIOLOGY REPORT*  Clinical Data: Right-sided headache for past 2 weeks.  CT HEAD WITHOUT CONTRAST  Technique:  Contiguous axial images were obtained from the base of the skull through the vertex without contrast.  Comparison: No priors.  Findings: No acute intracranial abnormalities.  Specifically, no evidence of intracranial mass, mass effect, definite signs of acute infarct, hydrocephalus or other abnormal intra or extra-axial fluid collections.  No depressed skull fractures are noted.  There is near complete opacification of the left maxillary sinus and mucosal thickening seen scattered throughout the ethmoid sinuses bilaterally.  In the posterior aspect of the right maxillary sinus there is a small soft tissue attenuation structure that may represent a small paranasal polyp or mucosal retention cyst.  IMPRESSION: 1.  No acute intracranial abnormalities. 2.  Appearance of the brain is normal, as above. 3.  Paranasal sinus disease, as above, including near complete opacification of the left maxillary sinus.  This could reflect acute sinusitis.  Original Report Authenticated By: Florencia Reasons, M.D.     1. Headache   2. Sinusitis       MDM  Right-sided headache. CT scan does not show any right-sided lesions. He does show sinusitis in the left side. She's history sinusitis. She'll be given Compazine for the right-sided headache here. Discharged home with antibiotics Sudafed and pain medicine for the sinusitis. She'll need followup with primary care as needed        Juliet Rude. Rubin Payor, MD 09/15/11 1243

## 2011-09-15 NOTE — ED Notes (Signed)
Headache for 2 weeks, rt. Side only, denies any n/v

## 2011-09-23 ENCOUNTER — Encounter (HOSPITAL_COMMUNITY): Payer: Self-pay | Admitting: *Deleted

## 2011-09-23 ENCOUNTER — Emergency Department (HOSPITAL_COMMUNITY)
Admission: EM | Admit: 2011-09-23 | Discharge: 2011-09-23 | Disposition: A | Discharge status: Home / Self Care | Payer: Medicaid Other | Attending: Emergency Medicine | Admitting: Emergency Medicine

## 2011-09-23 DIAGNOSIS — I10 Essential (primary) hypertension: Secondary | ICD-10-CM | POA: Insufficient documentation

## 2011-09-23 DIAGNOSIS — R51 Headache: Secondary | ICD-10-CM | POA: Insufficient documentation

## 2011-09-23 HISTORY — DX: Essential (primary) hypertension: I10

## 2011-09-23 MED ORDER — KETOROLAC TROMETHAMINE 30 MG/ML IJ SOLN
30.0000 mg | Freq: Once | INTRAMUSCULAR | Status: AC
  Administered 2011-09-23: 30 mg via INTRAMUSCULAR
  Filled 2011-09-23: qty 1

## 2011-09-23 MED ORDER — DIPHENHYDRAMINE HCL 25 MG PO CAPS
25.0000 mg | ORAL_CAPSULE | Freq: Once | ORAL | Status: AC
  Administered 2011-09-23: 25 mg via ORAL
  Filled 2011-09-23: qty 1

## 2011-09-23 MED ORDER — METOCLOPRAMIDE HCL 5 MG/ML IJ SOLN
10.0000 mg | Freq: Once | INTRAMUSCULAR | Status: AC
  Administered 2011-09-23: 10 mg via INTRAMUSCULAR
  Filled 2011-09-23: qty 2

## 2011-09-23 MED ORDER — KETOROLAC TROMETHAMINE 30 MG/ML IJ SOLN: 30.0000 mg | Freq: Once | INTRAMUSCULAR | Status: DC

## 2011-09-23 NOTE — ED Notes (Signed)
Pt reports headache x2 weeks on and off. Pt denies history of migraines. Pt reports pain is worse on right side. Pt denies photophobia. Pt denies N/V.  Pt was seen last Friday for the same symptoms at [facility] and given medication to take at home without relief. Pt reports she has returned as the headache has not resolved.

## 2011-09-23 NOTE — Discharge Instructions (Signed)
Headache, General, Unknown Cause The specific cause of your headache may not have been found today. There are many causes and types of headache. A few common ones are:  Tension headache.   Migraine.   Infections (examples: dental and sinus infections).   Bone and/or joint problems in the neck or jaw.   Depression.   Eye problems.  These headaches are not life threatening.   FINDING OUT THE RESULTS OF TESTS  You will be contacted by the emergency department or your physician if any test results require a change in your treatment plan.   Not all test results may be available during your visit. If your test results are not back during the visit, make an appointment with your caregiver to find out the results. Do not assume everything is normal if you have not heard from your caregiver or the medical facility. It is important for you to follow up on all of your test results.  HOME CARE INSTRUCTIONS   Keep follow-up appointments with your caregiver, or any specialist referral.   Only take over-the-counter or prescription medicines for pain, discomfort, or fever as directed by your caregiver.   Biofeedback, massage, or other relaxation techniques may be helpful.   Ice packs or heat applied to the head and neck can be used. Do this three to four times per day, or as needed.   Call your doctor if you have any questions or concerns.   If you smoke, you should quit.  SEEK MEDICAL CARE IF:   You develop problems with medications prescribed.   You do not respond to or obtain relief from medications.   You have a change from the usual headache.   You develop nausea or vomiting.  SEEK IMMEDIATE MEDICAL CARE IF:   If your headache becomes severe.   You have an unexplained oral temperature above 102 F (38.9 C), or as your caregiver suggests.   You have a stiff neck.   You have loss of vision.   You have muscular weakness.   You have loss of muscular control.   You develop  severe symptoms different from your first symptoms.   You start losing your balance or have trouble walking.   You feel faint or pass out.  MAKE SURE YOU:   Understand these instructions.   Will watch your condition.   Will get help right away if you are not doing well or get worse.  Document Released: 07/10/2005 Document Revised: 03/22/2011 Document Reviewed: 02/27/2008 [facility] Patient Information 2012 [place], [place].   RESOURCE GUIDE  Dental Problems  Patients with Medicaid: [facility] [phone]                                           [phone] Phone:  [phone]                                                  Phone:  [phone]  If unable to pay or uninsured, contact:  [facility] or [facility] to become qualified for the adult dental clinic.  Chronic Pain Problems Contact [facility]  [phone] Patients need to be referred by their primary care doctor.  Insufficient Money for Medicine Contact [employer]:  call "211" or [facility] [phone].  No Primary Care Doctor Call [facility]  [phone] Other agencies that provide inexpensive medical care    [facility]  [phone]    [facility]  [phone]    [facility]  [phone]    [facility]  [phone]    [facility]  [phone]    [facility]  [phone]  Psychological Services [facility]  [phone] [facility] Services  [phone] [facility]   [phone] (emergency services [phone])  Substance Abuse Resources [facility]  [phone] [facility] [phone] [facility] [place] [phone] Floydene Flock [phone] Residential & Outpatient Substance Abuse Program  [phone]  Abuse/Neglect [facility] Child Abuse Hotline 5747958539 [facility] Child Abuse Hotline  [phone] (After Hours)  Emergency Shelter [facility] [phone]  [facility] [phone] [facility] [phone]  MRSA Hotline #:   270-438-9783    [facility] Resources  [facility]     [employer]                          [facility] Dept. [address] [place]                       [address]      [address]  Blondell Reveal Phone:  [phone]                                   Phone:  [phone]                 Phone:  [phone]  [facility] Phone:  [phone]  [facility] Child Abuse Hotline 908-369-7986 [phone] (After Hours)

## 2011-09-23 NOTE — ED Provider Notes (Signed)
History     CSN: [id number]  Arrival date & time 09/23/11  1716   First MD Initiated Contact with Patient 09/23/11 1747      Chief Complaint  Patient presents with  . Headache    (Consider location/radiation/quality/duration/timing/severity/associated sxs/prior treatment) HPI Comments: Patient was seen in the Emergency Department on 09/15/11 for similar headache.  She reports that her headache today is no different than her headache at that time.  Pain over the left temporal region.  On 09/15/11 a CT head was done, which was negative.  No head trauma.  Patient is a 26 y.o. female presenting with headaches. The history is provided by the patient.  Headache  This is a recurrent problem. The current episode started yesterday. The headache is associated with nothing. The pain is located in the right unilateral region. The quality of the pain is described as dull. The pain does not radiate. Pertinent negatives include no fever, no malaise/fatigue, no chest pressure, no near-syncope, no palpitations, no syncope, no shortness of breath, no nausea and no vomiting.    Past Medical History  Diagnosis Date  . Sinus headache   . Hypertension     History reviewed. No pertinent past surgical history.  No family history on file.  History  Substance Use Topics  . Smoking status: Never Smoker   . Smokeless tobacco: Not on file  . Alcohol Use: No    OB History    Grav Para Term Preterm Abortions TAB SAB Ect Mult Living                  Review of Systems  Constitutional: Negative for fever, chills and malaise/fatigue.  HENT: Negative for congestion, neck pain, neck stiffness and sinus pressure.   Eyes: Negative for photophobia, pain, redness and visual disturbance.  Respiratory: Negative for cough and shortness of breath.   Cardiovascular: Negative for palpitations, syncope and near-syncope.  Gastrointestinal: Negative for nausea and vomiting.  Musculoskeletal: Negative for gait  problem.  Skin: Negative for rash.  Neurological: Positive for headaches. Negative for dizziness, syncope, facial asymmetry, light-headedness and numbness.  Psychiatric/Behavioral: Negative for confusion.    Allergies  Review of patient's allergies indicates no known allergies.  Home Medications   Current Outpatient Rx  Name Route Sig Dispense Refill  . BC HEADACHE POWDER PO Oral Take 1 packet by mouth every 6 (six) hours as needed. For pain    . PSEUDOEPHEDRINE HCL 60 MG PO TABS Oral Take 1 tablet (60 mg total) by mouth every 6 (six) hours as needed for congestion. 10 tablet 0  . SALINE NASAL SPRAY 0.65 % NA SOLN Nasal Place 1 spray into the nose as needed for congestion. 30 mL 0  . TRAMADOL HCL 50 MG PO TABS Oral Take 1 tablet (50 mg total) by mouth every 6 (six) hours as needed for pain. 15 tablet 0    BP 133/93  Pulse 87  Temp(Src) 98.9 F (37.2 C) (Oral)  Resp 18  SpO2 100%  LMP 09/17/2011  Physical Exam  Nursing note and vitals reviewed. Constitutional: She is oriented to person, place, and time. She appears well-developed and well-nourished. No distress.  HENT:  Head: Normocephalic and atraumatic.  Right Ear: Tympanic membrane and ear canal normal.  Left Ear: Tympanic membrane and ear canal normal.  Nose: Nose normal. Right sinus exhibits no maxillary sinus tenderness and no frontal sinus tenderness. Left sinus exhibits no maxillary sinus tenderness and no frontal sinus tenderness.  Mouth/Throat: Uvula is  midline, oropharynx is clear and moist and mucous membranes are normal.  Eyes: EOM are normal. Pupils are equal, round, and reactive to light.  Neck: Normal range of motion. Neck supple.  Cardiovascular: Normal rate, regular rhythm, normal heart sounds and intact distal pulses.   Pulmonary/Chest: Effort normal and breath sounds normal. No respiratory distress.  Musculoskeletal: Normal range of motion.  Neurological: She is alert and oriented to person, place, and  time. She has normal strength. No cranial nerve deficit or sensory deficit. Coordination and gait normal.  Skin: Skin is warm and dry. No rash noted. She is not diaphoretic.  Psychiatric: She has a normal mood and affect.    ED Course  Procedures (including critical care time)  Labs Reviewed - No data to display No results found.   No diagnosis found.  7:26 PM  Reassessed patient.  She reports that her headache has improved.   MDM  Patient with headache similar to headaches she has had in the past.  Patient had recent negative head CT.  Normal neurological exam.  Pain improved with migraine cocktail in the ED.  Therefore, feel that patient can be discharged home with PCP follow up.        Pascal Lux [place], PA-C 09/23/11 1954

## 2011-09-24 NOTE — ED Provider Notes (Signed)
Medical screening examination/treatment/procedure(s) were performed by non-physician practitioner and as supervising physician I was immediately available for consultation/collaboration.   Glynn Octave, MD 09/24/11 [place]

## 2011-12-04 ENCOUNTER — Encounter (HOSPITAL_COMMUNITY): Payer: Self-pay | Admitting: Emergency Medicine

## 2011-12-04 ENCOUNTER — Emergency Department (HOSPITAL_COMMUNITY)
Admission: EM | Admit: 2011-12-04 | Discharge: 2011-12-04 | Disposition: A | Discharge status: Home / Self Care | Payer: Self-pay | Attending: Emergency Medicine | Admitting: Emergency Medicine

## 2011-12-04 DIAGNOSIS — H9209 Otalgia, unspecified ear: Secondary | ICD-10-CM | POA: Insufficient documentation

## 2011-12-04 DIAGNOSIS — H9202 Otalgia, left ear: Secondary | ICD-10-CM

## 2011-12-04 DIAGNOSIS — I10 Essential (primary) hypertension: Secondary | ICD-10-CM | POA: Insufficient documentation

## 2011-12-04 MED ORDER — PSEUDOEPHEDRINE HCL 30 MG PO TABS: 30.0000 mg | ORAL_TABLET | ORAL | Status: AC | PRN

## 2011-12-04 MED ORDER — ANTIPYRINE-BENZOCAINE 5.4-1.4 % OT SOLN
3.0000 [drp] | Freq: Once | OTIC | Status: AC
  Administered 2011-12-04: 4 [drp] via OTIC
  Filled 2011-12-04: qty 10

## 2011-12-04 NOTE — ED Notes (Signed)
Pt. With earache on left side which started this AM.  No other symptoms.

## 2011-12-04 NOTE — ED Provider Notes (Signed)
History     CSN: [id number]  Arrival date & time 12/04/11  1818   First MD Initiated Contact with Patient 12/04/11 1821      Chief Complaint  Patient presents with  . Otalgia    (Consider location/radiation/quality/duration/timing/severity/associated sxs/prior treatment) Patient is a 26 y.o. female presenting with ear pain. The history is provided by the patient.  Otalgia This is a new problem. The current episode started 6 to 12 hours ago. There is pain in the left ear. The problem occurs constantly. The problem has not changed since onset.There has been no fever. The pain is at a severity of 7/10. The pain is moderate. Pertinent negatives include no ear discharge, no headaches, no hearing loss, no rhinorrhea, no sore throat, no vomiting, no neck pain, no cough and no rash.  Pt states pain started when she woke up. Denies injury, drainage, hearing changes, popping sensation. No other complaints. Took excedrin with no relief.   Past Medical History  Diagnosis Date  . Sinus headache   . Hypertension     No past surgical history on file.  No family history on file.  History  Substance Use Topics  . Smoking status: Never Smoker   . Smokeless tobacco: Not on file  . Alcohol Use: No    OB History    Grav Para Term Preterm Abortions TAB SAB Ect Mult Living                  Review of Systems  Constitutional: Negative for fever and chills.  HENT: Positive for ear pain. Negative for hearing loss, sore throat, rhinorrhea, neck pain and ear discharge.   Respiratory: Negative.  Negative for cough.   Cardiovascular: Negative.   Gastrointestinal: Negative for vomiting.  Skin: Negative for rash.  Neurological: Negative for dizziness and headaches.    Allergies  Review of patient's allergies indicates no known allergies.  Home Medications   Current Outpatient Rx  Name Route Sig Dispense Refill  . ASPIRIN-ACETAMINOPHEN-CAFFEINE 250-250-65 MG PO TABS Oral Take 1 tablet by  mouth every 6 (six) hours as needed.    Marland Kitchen NAPROXEN SODIUM 220 MG PO TABS Oral Take 220 mg by mouth 2 (two) times daily with a meal.      BP 144/92  Pulse 74  Temp(Src) 98.6 F (37 C) (Oral)  Resp 20  Wt 268 lb (121.564 kg)  SpO2 100%  LMP 11/14/2011  Physical Exam  Nursing note and vitals reviewed. Constitutional: She is oriented to person, place, and time. She appears well-developed and well-nourished.  HENT:  Head: Normocephalic and atraumatic.  Right Ear: Tympanic membrane, external ear and ear canal normal.  Left Ear: Tympanic membrane, external ear and ear canal normal.  Nose: Nose normal.  Mouth/Throat: Uvula is midline, oropharynx is clear and moist and mucous membranes are normal.  Eyes: Conjunctivae are normal.  Neck: Neck supple.  Cardiovascular: Normal rate and regular rhythm.  Exam reveals no friction rub (0.).   Pulmonary/Chest: Effort normal and breath sounds normal. No respiratory distress. She has no wheezes.  Musculoskeletal: Normal range of motion.  Neurological: She is alert and oriented to person, place, and time.  Skin: Skin is warm and dry.    ED Course  Procedures (including critical care time)   Pt has no signs of infectionon exam, TM normal and intact. no injury. no tenderness aroun the ear. no signs of mastoiditis. Pt afebrile, non toxic. Possible pain from pressure on TM, will start on decongestants. follow  up as needed    . 1. Left ear pain       MDM          Lottie Mussel, PA 12/05/11 [phone]

## 2011-12-04 NOTE — Discharge Instructions (Signed)
Your exam does not show any evidence of an infection. Start sudafed for possible congestion which could be what is causing your pain. Continue naprosyn and tylenol. Use auralgan drops every 2 hrs as needed. Follow up with your doctor for recheck if not improving. \  Otalgia The most common reason for this in children is an infection of the middle ear. Pain from the middle ear is usually caused by a build-up of fluid and pressure behind the eardrum. Pain from an earache can be sharp, dull, or burning. The pain may be temporary or constant. The middle ear is connected to the nasal passages by a short narrow tube called the Eustachian tube. The Eustachian tube allows fluid to drain out of the middle ear, and helps keep the pressure in your ear equalized. CAUSES  A cold or allergy can block the Eustachian tube with inflammation and the build-up of secretions. This is especially likely in small children, because their Eustachian tube is shorter and more horizontal. When the Eustachian tube closes, the normal flow of fluid from the middle ear is stopped. Fluid can accumulate and cause stuffiness, pain, hearing loss, and an ear infection if germs start growing in this area. SYMPTOMS  The symptoms of an ear infection may include fever, ear pain, fussiness, increased crying, and irritability. Many children will have temporary and minor hearing loss during and right after an ear infection. Permanent hearing loss is rare, but the risk increases the more infections a child has. Other causes of ear pain include retained water in the outer ear canal from swimming and bathing. Ear pain in adults is less likely to be from an ear infection. Ear pain may be referred from other locations. Referred pain may be from the joint between your jaw and the skull. It may also come from a tooth problem or problems in the neck. Other causes of ear pain include:  A foreign body in the ear.   Outer ear infection.   Sinus infections.     Impacted ear wax.   Ear injury.   Arthritis of the jaw or TMJ problems.   Middle ear infection.   Tooth infections.   Sore throat with pain to the ears.  DIAGNOSIS  Your caregiver can usually make the diagnosis by examining you. Sometimes other special studies, including x-rays and lab work may be necessary. TREATMENT   If antibiotics were prescribed, use them as directed and finish them even if you or your child's symptoms seem to be improved.   Sometimes PE tubes are needed in children. These are little plastic tubes which are put into the eardrum during a simple surgical procedure. They allow fluid to drain easier and allow the pressure in the middle ear to equalize. This helps relieve the ear pain caused by pressure changes.  HOME CARE INSTRUCTIONS   Only take over-the-counter or prescription medicines for pain, discomfort, or fever as directed by your caregiver. DO NOT GIVE CHILDREN ASPIRIN because of the association of Reye's Syndrome in children taking aspirin.   Use a cold pack applied to the outer ear for 15 to 20 minutes, 3 to 4 times per day or as needed may reduce pain. Do not apply ice directly to the skin. You may cause frost bite.   Over-the-counter ear drops used as directed may be effective. Your caregiver may sometimes prescribe ear drops.   Resting in an upright position may help reduce pressure in the middle ear and relieve pain.   Ear pain  caused by rapidly descending from high altitudes can be relieved by swallowing or chewing gum. Allowing infants to suck on a bottle during airplane travel can help.   Do not smoke in the house or near children. If you are unable to quit smoking, smoke outside.   Control allergies.  SEEK IMMEDIATE MEDICAL CARE IF:   You or your child are becoming sicker.   Pain or fever relief is not obtained with medicine.   You or your child's symptoms (pain, fever, or irritability) do not improve within 24 to 48 hours or as  instructed.   Severe pain suddenly stops hurting. This may indicate a ruptured eardrum.   You or your children develop new problems such as severe headaches, stiff neck, difficulty swallowing, or swelling of the face or around the ear.  Document Released: 02/25/2004 Document Revised: 06/29/2011 Document Reviewed: 07/01/2008 [facility] Patient Information 2012 [place], [place].

## 2011-12-05 NOTE — ED Provider Notes (Signed)
Medical screening examination/treatment/procedure(s) were performed by non-physician practitioner and as supervising physician I was immediately available for consultation/collaboration.  Aylan Bayona, MD 12/05/11 1840 

## 2012-03-06 ENCOUNTER — Emergency Department (HOSPITAL_COMMUNITY)
Admission: EM | Admit: 2012-03-06 | Discharge: 2012-03-06 | Discharge status: Against Medical Advice | Payer: Self-pay | Attending: Emergency Medicine | Admitting: Emergency Medicine

## 2012-03-06 ENCOUNTER — Encounter (HOSPITAL_COMMUNITY): Payer: Self-pay

## 2012-03-06 DIAGNOSIS — R1084 Generalized abdominal pain: Secondary | ICD-10-CM | POA: Insufficient documentation

## 2012-03-06 LAB — URINE MICROSCOPIC-ADD ON

## 2012-03-06 LAB — URINALYSIS, ROUTINE W REFLEX MICROSCOPIC
Glucose, UA: NEGATIVE mg/dL
Hgb urine dipstick: NEGATIVE
Specific Gravity, Urine: 1.022 (ref 1.005–1.030)
Urobilinogen, UA: 1 mg/dL (ref 0.0–1.0)

## 2012-03-06 LAB — POCT PREGNANCY, URINE: Preg Test, Ur: NEGATIVE

## 2012-03-06 NOTE — ED Notes (Signed)
Unable to locate pt in waiting area 

## 2012-03-06 NOTE — ED Notes (Signed)
Pt was called for blood test with no answer x1

## 2012-03-06 NOTE — ED Notes (Signed)
Unable to locate pt  

## 2012-03-06 NOTE — ED Notes (Signed)
Pt reports generalized abd pain x1 week, pt reports decrease appetite

## 2012-06-25 ENCOUNTER — Emergency Department (HOSPITAL_COMMUNITY): Payer: Self-pay

## 2012-06-25 ENCOUNTER — Encounter (HOSPITAL_COMMUNITY): Payer: Self-pay | Admitting: *Deleted

## 2012-06-25 ENCOUNTER — Emergency Department (HOSPITAL_COMMUNITY)
Admission: EM | Admit: 2012-06-25 | Discharge: 2012-06-25 | Disposition: A | Discharge status: Home / Self Care | Payer: Self-pay | Attending: Emergency Medicine | Admitting: Emergency Medicine

## 2012-06-25 DIAGNOSIS — A599 Trichomoniasis, unspecified: Secondary | ICD-10-CM | POA: Insufficient documentation

## 2012-06-25 DIAGNOSIS — I10 Essential (primary) hypertension: Secondary | ICD-10-CM | POA: Insufficient documentation

## 2012-06-25 DIAGNOSIS — O200 Threatened abortion: Secondary | ICD-10-CM | POA: Insufficient documentation

## 2012-06-25 LAB — WET PREP, GENITAL: Yeast Wet Prep HPF POC: NONE SEEN

## 2012-06-25 LAB — CBC WITH DIFFERENTIAL/PLATELET
Basophils Absolute: 0 10*3/uL (ref 0.0–0.1)
Basophils Relative: 0 % (ref 0–1)
Eosinophils Absolute: 0.3 10*3/uL (ref 0.0–0.7)
HCT: 33.8 % — ABNORMAL LOW (ref 36.0–46.0)
MCH: 26 pg (ref 26.0–34.0)
MCHC: 32.2 g/dL (ref 30.0–36.0)
Monocytes Absolute: 0.5 10*3/uL (ref 0.1–1.0)
Neutro Abs: 6.4 10*3/uL (ref 1.7–7.7)
RDW: 14.8 % (ref 11.5–15.5)

## 2012-06-25 LAB — BASIC METABOLIC PANEL
BUN: 13 mg/dL (ref 6–23)
Calcium: 9.7 mg/dL (ref 8.4–10.5)
Chloride: 101 mEq/L (ref 96–112)
Creatinine, Ser: 0.71 mg/dL (ref 0.50–1.10)
GFR calc Af Amer: >90 mL/min (ref >90)
GFR calc non Af Amer: >90 mL/min (ref >90)

## 2012-06-25 LAB — URINALYSIS, ROUTINE W REFLEX MICROSCOPIC
Bilirubin Urine: NEGATIVE
Ketones, ur: NEGATIVE mg/dL
Nitrite: NEGATIVE
Protein, ur: NEGATIVE mg/dL
Urobilinogen, UA: 0.2 mg/dL (ref 0.0–1.0)
pH: 8.5 — ABNORMAL HIGH (ref 5.0–8.0)

## 2012-06-25 MED ORDER — ACETAMINOPHEN 325 MG PO TABS
975.0000 mg | ORAL_TABLET | Freq: Once | ORAL | Status: AC
  Administered 2012-06-25: 975 mg via ORAL
  Filled 2012-06-25: qty 3

## 2012-06-25 MED ORDER — ACETAMINOPHEN 500 MG PO TABS: 500.0000 mg | ORAL_TABLET | Freq: Four times a day (QID) | ORAL | Status: DC | PRN

## 2012-06-25 MED ORDER — METRONIDAZOLE 500 MG PO TABS
2000.0000 mg | ORAL_TABLET | Freq: Once | ORAL | Status: AC
  Administered 2012-06-25: 2000 mg via ORAL
  Filled 2012-06-25: qty 4

## 2012-06-25 NOTE — ED Notes (Signed)
Pt is here with lower abdominal pain.  Pt LMP May 15, 2012.  Pt states she is pregnant.  Pain started today.  No vaginal discharge or bleeding, constipation, vomiting, diarrhea, or urinary symptoms

## 2012-06-25 NOTE — ED Provider Notes (Signed)
Medical screening examination/treatment/procedure(s) were performed by non-physician practitioner and as supervising physician I was immediately available for consultation/collaboration.   Charles B. Sheldon, MD 06/25/12 2331 

## 2012-06-25 NOTE — Discharge Instructions (Signed)
Take tylenol as needed for pain. Follow up at [facility] in 48 hours for a re-check. Refer to attached documents for more information regarding your diagnosis. Return to the ED with worsening or concerning symptoms.

## 2012-06-25 NOTE — ED Provider Notes (Signed)
History     CSN: [id number]  Arrival date & time 06/25/12  1612   First MD Initiated Contact with Patient 06/25/12 1706      Chief Complaint  Patient presents with  . Abdominal Pain    (Consider location/radiation/quality/duration/timing/severity/associated sxs/prior treatment) HPI Comments: Patient is a 26 year old G3P2 female who presents with a 1 day history of lower abdominal pain. The pain is located left lower quadrant and does not radiate. The pain is described as cramping and severe. The pain started gradually and progressively worsened since the onset. No alleviating/aggravating factors. The patient has tried nothing for symptoms without relief. Associated symptoms include nothing. Patient denies fever, headache, NVD, chest pain, SOB, dysuria, constipation, abnormal vaginal bleeding/discharge.     Patient is a 26 y.o. female presenting with abdominal pain.  Abdominal Pain The primary symptoms of the illness include abdominal pain.    Past Medical History  Diagnosis Date  . Sinus headache   . Hypertension     History reviewed. No pertinent past surgical history.  No family history on file.  History  Substance Use Topics  . Smoking status: Never Smoker   . Smokeless tobacco: Not on file  . Alcohol Use: No    OB History    Grav Para Term Preterm Abortions TAB SAB Ect Mult Living   1               Review of Systems  Gastrointestinal: Positive for abdominal pain.  All other systems reviewed and are negative.    Allergies  Review of patient's allergies indicates no known allergies.  Home Medications  No current outpatient prescriptions on file.  BP 134/87  Pulse 98  Temp 97.9 F (36.6 C) (Oral)  Resp 18  SpO2 100%  LMP 05/15/2012  Physical Exam  Nursing note and vitals reviewed. Constitutional: She appears well-developed and well-nourished. No distress.  HENT:  Head: Normocephalic and atraumatic.  Eyes: Conjunctivae normal and EOM are normal.  Pupils are equal, round, and reactive to light. No scleral icterus.  Neck: Normal range of motion. Neck supple.  Cardiovascular: Normal rate and regular rhythm.  Exam reveals no gallop and no friction rub.   No murmur heard. Pulmonary/Chest: Effort normal and breath sounds normal. She has no wheezes. She has no rales. She exhibits no tenderness.  Abdominal: Soft. She exhibits no distension. There is tenderness. There is no rebound and no guarding.       Left sided abdominal tenderness in mid and lower quadrants to palpation. No suprapubic tenderness.   Genitourinary:       Cervix slightly bluish in color. Cervical os closed. Malodorous discharge in vagina. Generalized tenderness to palpation of lower abdomen on bimanual exam. Any palpable abnormality would be difficult to assess on bimanual exam based on patient's body habitus.   Musculoskeletal: Normal range of motion.  Neurological: She is alert.       Speech is goal-oriented. Moves limbs without ataxia.   Skin: Skin is warm and dry. She is not diaphoretic.  Psychiatric: She has a normal mood and affect. Her behavior is normal.    ED Course  Procedures (including critical care time)  Labs Reviewed  CBC WITH DIFFERENTIAL - Abnormal; Notable for the following:    Hemoglobin 10.9 (*)     HCT 33.8 (*)     All other components within normal limits  URINALYSIS, ROUTINE W REFLEX MICROSCOPIC - Abnormal; Notable for the following:    APPearance CLOUDY (*)  pH 8.5 (*)     Leukocytes, UA SMALL (*)     All other components within normal limits  POCT PREGNANCY, URINE - Abnormal; Notable for the following:    Preg Test, Ur POSITIVE (*)     All other components within normal limits  URINE MICROSCOPIC-ADD ON - Abnormal; Notable for the following:    Bacteria, UA FEW (*)     All other components within normal limits  WET PREP, GENITAL - Abnormal; Notable for the following:    Trich, Wet Prep MODERATE (*)     Clue Cells Wet Prep HPF POC FEW  (*)     WBC, Wet Prep HPF POC MODERATE (*)     All other components within normal limits  HCG, QUANTITATIVE, PREGNANCY - Abnormal; Notable for the following:    hCG, Beta Chain, Quant, S 13716 (*)     All other components within normal limits  BASIC METABOLIC PANEL  GC/CHLAMYDIA PROBE AMP  RPR   US Ob Comp Less 14 Wks  06/25/2012  *RADIOLOGY REPORT*  Clinical Data: Abdominal pain and cramping.  Positive pregnancy test.  Unsure of LMP.  OBSTETRIC <14 WK [place] AND TRANSVAGINAL OB US  Technique:  Both transabdominal and transvaginal ultrasound examinations were performed for complete evaluation of the gestation as well as the maternal uterus, adnexal regions, and pelvic cul-de-sac.  Transvaginal technique was performed to assess early pregnancy.  Comparison:  None.  Intrauterine gestational sac:  Single irregular in shape Yolk sac: Visualized Embryo: Not visualized  MSD: 14 mm  6 w 2 d  Maternal uterus/adnexae: Normal appearance of right ovary.  Nonvisualization of left ovary, no adnexal mass or free fluid identified.  IMPRESSION: Single intrauterine gestational sac measuring 6 weeks 2 days has very irregular shape, which is poor prognostic sign.  Recommend continued follow-up of quantitative beta HCG levels, and follow-up by ultrasound in 7-10 days if clinically warranted.   Original Report Authenticated By: Myles Rosenthal, M.D.    US Ob Transvaginal  06/25/2012  *RADIOLOGY REPORT*  Clinical Data: Abdominal pain and cramping.  Positive pregnancy test.  Unsure of LMP.  OBSTETRIC <14 WK [place] AND TRANSVAGINAL OB US  Technique:  Both transabdominal and transvaginal ultrasound examinations were performed for complete evaluation of the gestation as well as the maternal uterus, adnexal regions, and pelvic cul-de-sac.  Transvaginal technique was performed to assess early pregnancy.  Comparison:  None.  Intrauterine gestational sac:  Single irregular in shape Yolk sac: Visualized Embryo: Not visualized  MSD: 14 mm  6 w 2  d  Maternal uterus/adnexae: Normal appearance of right ovary.  Nonvisualization of left ovary, no adnexal mass or free fluid identified.  IMPRESSION: Single intrauterine gestational sac measuring 6 weeks 2 days has very irregular shape, which is poor prognostic sign.  Recommend continued follow-up of quantitative beta HCG levels, and follow-up by ultrasound in 7-10 days if clinically warranted.   Original Report Authenticated By: Myles Rosenthal, M.D.      1. Threatened abortion   2. Trichomonas       MDM  6:23 PM Urine pregnancy test positive. Labs unremarkable. Patient will have tylenol for pain and a pelvic exam.   7:17 PM Pelvic done. Patient will have quantitative hcg and pelvic US.   9:48 PM Patient's [place] correlates with quant HCG. US shows an irregularly shaped gestational sac. Patient will have a follow up US in 48 hours at [facility] hospital. Patient will also be treated for trichomonas here with 2g Metronidazole  PO once. Patient instructed to return with worsening or concerning symptoms.     Emilia Beck, PA-C 06/25/12 2155

## 2012-06-26 ENCOUNTER — Inpatient Hospital Stay (HOSPITAL_COMMUNITY)
Admission: AD | Admit: 2012-06-26 | Discharge: 2012-06-26 | Disposition: A | Discharge status: Home / Self Care | Payer: Self-pay | Source: Ambulatory Visit | Attending: Obstetrics and Gynecology | Admitting: Obstetrics and Gynecology

## 2012-06-26 ENCOUNTER — Encounter (HOSPITAL_COMMUNITY): Payer: Self-pay

## 2012-06-26 DIAGNOSIS — R109 Unspecified abdominal pain: Secondary | ICD-10-CM | POA: Insufficient documentation

## 2012-06-26 DIAGNOSIS — O200 Threatened abortion: Secondary | ICD-10-CM | POA: Insufficient documentation

## 2012-06-26 LAB — GC/CHLAMYDIA PROBE AMP
CT Probe RNA: NEGATIVE
GC Probe RNA: POSITIVE — AB

## 2012-06-26 LAB — URINALYSIS, ROUTINE W REFLEX MICROSCOPIC
Bilirubin Urine: NEGATIVE
Hgb urine dipstick: NEGATIVE
Protein, ur: NEGATIVE mg/dL
Urobilinogen, UA: 0.2 mg/dL (ref 0.0–1.0)

## 2012-06-26 MED ORDER — OXYCODONE-ACETAMINOPHEN 5-325 MG PO TABS
2.0000 | ORAL_TABLET | Freq: Once | ORAL | Status: AC
  Administered 2012-06-26: 2 via ORAL
  Filled 2012-06-26: qty 2

## 2012-06-26 MED ORDER — OXYCODONE-ACETAMINOPHEN 5-325 MG PO TABS: 1.0000 | ORAL_TABLET | ORAL | Status: DC | PRN

## 2012-06-26 NOTE — MAU Provider Note (Signed)
Chief Complaint: Abdominal Pain  First Provider Initiated Contact with Patient 06/26/12 1552     SUBJECTIVE HPI: Amber Sanders is a 26 y.o. G3P2002 at [redacted]w[redacted]d by LMP who presents to MAU with worsening low abd pain. She was Dx w/ threatened AB yesterday at [facility] ED. Irreg GS, +YS, -FP.  Denies VB, urinary complaints, GI complaints. GC/Ct pending. Wet prep Trich +.   Results for Amber Sanders (MRN [id number]) as of 06/26/2012 17:03  Ref. Range 06/25/2012 19:39  hCG, Beta Chain, Quant, S Latest Range: <5 mIU/mL 13716 (H)    Past Medical History  Diagnosis Date  . Sinus headache   . Hypertension    OB History    Grav Para Term Preterm Abortions TAB SAB Ect Mult Living   3 2 2       2      # Outc Date GA Lbr Len/2nd Wgt Sex Del Anes PTL Lv   1 TRM            2 TRM            3 CUR              History reviewed. No pertinent past surgical history. History   Social History  . Marital Status: Single    Spouse Name: N/A    Number of Children: N/A  . Years of Education: N/A   Occupational History  . Not on file.   Social History Main Topics  . Smoking status: Never Smoker   . Smokeless tobacco: Not on file  . Alcohol Use: No  . Drug Use: No  . Sexually Active: Yes    Birth Control/ Protection: None   Other Topics Concern  . Not on file   Social History Narrative  . No narrative on file   Current Facility-Administered Medications on File Prior to Encounter  Medication Dose Route Frequency Provider Last Rate Last Dose  . [COMPLETED] acetaminophen (TYLENOL) tablet 975 mg  975 mg Oral Once [employer], PA-C   975 mg at 06/25/12 1848  . [COMPLETED] metroNIDAZOLE (FLAGYL) tablet 2,000 mg  2,000 mg Oral Once [facility], PA-C   2,000 mg at 06/25/12 2214   Current Outpatient Prescriptions on File Prior to Encounter  Medication Sig Dispense Refill  . acetaminophen (TYLENOL) 500 MG tablet Take 1 tablet (500 mg total) by mouth every 6 (six) hours as needed for pain.   30 tablet  0   No Known Allergies  ROS: Pertinent items in HPI  OBJECTIVE Blood pressure 128/57, pulse 77, temperature 98.2 F (36.8 C), temperature source Oral, resp. rate 18, height 5\' 6"  (1.676 m), weight 121.292 kg (267 lb 6.4 oz), last menstrual period 05/15/2012. GENERAL: Well-developed, well-nourished female in mild-moderate distress.  HEENT: Normocephalic HEART: normal rate RESP: normal effort ABDOMEN: Soft, non-tender EXTREMITIES: Nontender, no edema NEURO: Alert and oriented SPECULUM EXAM: deferred  LAB RESULTS Results for orders placed during the hospital encounter of 06/26/12 (from the past 24 hour(s))  URINALYSIS, ROUTINE W REFLEX MICROSCOPIC     Status: Abnormal   Collection Time   06/26/12  2:25 PM      Component Value Range   Color, Urine BROWN (*) YELLOW   APPearance CLEAR  CLEAR   Specific Gravity, Urine 1.025  1.005 - 1.030   pH 6.5  5.0 - 8.0   Glucose, UA NEGATIVE  NEGATIVE mg/dL   Hgb urine dipstick NEGATIVE  NEGATIVE   Bilirubin Urine NEGATIVE  NEGATIVE  Ketones, ur NEGATIVE  NEGATIVE mg/dL   Protein, ur NEGATIVE  NEGATIVE mg/dL   Urobilinogen, UA 0.2  0.0 - 1.0 mg/dL   Nitrite NEGATIVE  NEGATIVE   Leukocytes, UA NEGATIVE  NEGATIVE  HCG, QUANTITATIVE, PREGNANCY     Status: Abnormal   Collection Time   06/26/12  3:54 PM      Component Value Range   hCG, Beta Chain, Quant, S 17240 (*) <5 mIU/mL  ABO/RH     Status: Normal (Preliminary result)   Collection Time   06/26/12  3:54 PM      Component Value Range   ABO/RH(D) B POS      IMAGING US Ob Comp Less 14 Wks  06/25/2012  *RADIOLOGY REPORT*  Clinical Data: Abdominal pain and cramping.  Positive pregnancy test.  Unsure of LMP.  OBSTETRIC <14 WK [place] AND TRANSVAGINAL OB US  Technique:  Both transabdominal and transvaginal ultrasound examinations were performed for complete evaluation of the gestation as well as the maternal uterus, adnexal regions, and pelvic cul-de-sac.  Transvaginal technique was  performed to assess early pregnancy.  Comparison:  None.  Intrauterine gestational sac:  Single irregular in shape Yolk sac: Visualized Embryo: Not visualized  MSD: 14 mm  6 w 2 d  Maternal uterus/adnexae: Normal appearance of right ovary.  Nonvisualization of left ovary, no adnexal mass or free fluid identified.  IMPRESSION: Single intrauterine gestational sac measuring 6 weeks 2 days has very irregular shape, which is poor prognostic sign.  Recommend continued follow-up of quantitative beta HCG levels, and follow-up by ultrasound in 7-10 days if clinically warranted.   Original Report Authenticated By: Amber Sanders, M.D.    MAU COURSE Pain improved significantly w/ Percocet.   ASSESSMENT 1. Threatened miscarriage    PLAN Discharge home Discussed US findings from yesterday. Very concerning for AB. No intervention needed at this time. SAB precautions. Discussed possible course if AB occurs.  Do not exceed 4 gm Tylenol in 24 hours.  Comfort measures.      Follow-up Information    Follow up with [facility] [place] [facility] AD[facility]. In 2 weeks. (or as needed if symptoms worsen)    Contact information:   [address] [id number] mc [place] [address] [phone]          Medication List     As of 06/26/2012  6:23 PM    TAKE these medications         acetaminophen 500 MG tablet   Commonly known as: TYLENOL   Take 1 tablet (500 mg total) by mouth every 6 (six) hours as needed for pain.      oxyCODONE-acetaminophen 5-325 MG per tablet   Commonly known as: PERCOCET/ROXICET   Take 1-2 tablets by mouth every 4 (four) hours as needed for pain.        [place], [place] 06/26/2012  6:23 PM

## 2012-06-26 NOTE — MAU Note (Signed)
Was seen at [facility] ED yesterday was told might be having miscarriage, no vaginal bleeding, pain is worse today.

## 2012-06-26 NOTE — MAU Note (Signed)
Pt states she started having pain yesterday. Pt states she seen and told that"sac that the baby was in was irregular shaped.

## 2012-06-28 NOTE — MAU Provider Note (Signed)
Attestation of Attending Supervision of Advanced Practitioner (CNM/NP): Evaluation and management procedures were performed by the Advanced Practitioner under my supervision and collaboration.  I have reviewed the Advanced Practitioner's note and chart, and I agree with the management and plan.  Wania Longstreth 06/28/2012 9:01 AM \

## 2012-06-30 ENCOUNTER — Telehealth (HOSPITAL_COMMUNITY): Payer: Self-pay | Admitting: Emergency Medicine

## 2012-06-30 NOTE — ED Notes (Signed)
+  Gonorrhea. Chart sent to EDP office for review. DHHS attached. °

## 2012-06-30 NOTE — ED Notes (Signed)
Rx called in to [facility] on [address] by [address].

## 2012-06-30 NOTE — ED Notes (Signed)
Chart returned from EDP office. Prescribed Cefixime 400 mg PO x 1 and Zithromax 1 gram PO x 1. Prescribed by Teressa Lower NP.

## 2012-07-02 ENCOUNTER — Encounter (HOSPITAL_COMMUNITY): Payer: Self-pay | Admitting: *Deleted

## 2012-07-02 ENCOUNTER — Inpatient Hospital Stay (HOSPITAL_COMMUNITY): Payer: Self-pay

## 2012-07-02 ENCOUNTER — Inpatient Hospital Stay (HOSPITAL_COMMUNITY)
Admission: AD | Admit: 2012-07-02 | Discharge: 2012-07-02 | Disposition: A | Discharge status: Home / Self Care | Payer: Self-pay | Source: Ambulatory Visit | Attending: Family Medicine | Admitting: Family Medicine

## 2012-07-02 DIAGNOSIS — O039 Complete or unspecified spontaneous abortion without complication: Secondary | ICD-10-CM

## 2012-07-02 DIAGNOSIS — R109 Unspecified abdominal pain: Secondary | ICD-10-CM | POA: Insufficient documentation

## 2012-07-02 DIAGNOSIS — O99891 Other specified diseases and conditions complicating pregnancy: Secondary | ICD-10-CM | POA: Insufficient documentation

## 2012-07-02 HISTORY — DX: Chlamydial infection, unspecified: A74.9

## 2012-07-02 HISTORY — DX: Trichomoniasis, unspecified: A59.9

## 2012-07-02 LAB — CBC WITH DIFFERENTIAL/PLATELET
Eosinophils Absolute: 0.3 10*3/uL (ref 0.0–0.7)
Eosinophils Relative: 3 % (ref 0–5)
HCT: 32.5 % — ABNORMAL LOW (ref 36.0–46.0)
Hemoglobin: 10.4 g/dL — ABNORMAL LOW (ref 12.0–15.0)
Lymphs Abs: 2.3 10*3/uL (ref 0.7–4.0)
MCH: 26.1 pg (ref 26.0–34.0)
MCV: 81.5 fL (ref 78.0–100.0)
Monocytes Relative: 4 % (ref 3–12)
RBC: 3.99 MIL/uL (ref 3.87–5.11)

## 2012-07-02 LAB — URINALYSIS, ROUTINE W REFLEX MICROSCOPIC
Bilirubin Urine: NEGATIVE
Hgb urine dipstick: NEGATIVE
Specific Gravity, Urine: 1.02 (ref 1.005–1.030)
pH: 8 (ref 5.0–8.0)

## 2012-07-02 MED ORDER — KETOROLAC TROMETHAMINE 10 MG PO TABS
10.0000 mg | ORAL_TABLET | Freq: Once | ORAL | Status: AC
  Administered 2012-07-02: 10 mg via ORAL
  Filled 2012-07-02: qty 1

## 2012-07-02 MED ORDER — CEFTRIAXONE SODIUM 250 MG IJ SOLR: 250.0000 mg | Freq: Once | INTRAMUSCULAR | Status: DC

## 2012-07-02 MED ORDER — KETOROLAC TROMETHAMINE 10 MG PO TABS: 10.0000 mg | ORAL_TABLET | Freq: Four times a day (QID) | ORAL | Status: DC | PRN

## 2012-07-02 NOTE — ED Provider Notes (Signed)
History     CSN: [id number]  Arrival date and time: 07/02/12 1048  HPI [name] is a 26 y.o. female at [redacted]w[redacted]d by [place] who presents today with c/o continued abdominal pain. The patient was seen in MAU on 06/26/12 and was prescribed Percocet for pain in addition to the Tylenol prescribed at the [facility] ED. The patient has been taking the Tylenol during the day while at work and Percocet at night. Neither has provided significant relief of her abdominal pain. She rates her pain 8/10 today. The pain is primarily in the pelvic area with some radiation to her lower back. She has also experienced headaches x 2 days. The patient denies VB, LOF or vaginal discharge. She also denies N/V or fever recently.     Chief Complaint  Patient presents with  . Abdominal Pain      OB History    Grav Para Term Preterm Abortions TAB SAB Ect Mult Living   3 2 2       2       Past Medical History  Diagnosis Date  . Sinus headache   . Hypertension   . Trichomonas   . Chlamydia     History reviewed. No pertinent past surgical history.  Family History  Problem Relation Age of Onset  . Cancer Father     History  Substance Use Topics  . Smoking status: Never Smoker   . Smokeless tobacco: Never Used  . Alcohol Use: No    Allergies: No Known Allergies  Prescriptions prior to admission  Medication Sig Dispense Refill  . acetaminophen (TYLENOL) 500 MG tablet Take 1 tablet (500 mg total) by mouth every 6 (six) hours as needed for pain.  30 tablet  0  . oxyCODONE-acetaminophen (PERCOCET/ROXICET) 5-325 MG per tablet Take 1-2 tablets by mouth every 4 (four) hours as needed for pain.  30 tablet  0    Review of Systems   All negative unless otherwise stated in HPI  Physical Exam   Blood pressure 118/67, pulse 67, temperature 98.2 F (36.8 C), temperature source Oral, resp. rate 20, height 5' 4.5" (1.638 m), weight 267 lb (121.11 kg), last menstrual period 05/15/2012, SpO2 100.00%.  Physical  Exam  Constitutional: She is oriented to person, place, and time. She appears well-developed and well-nourished. No distress.  HENT:  Head: Normocephalic.  Cardiovascular: Normal rate.   Respiratory: Effort normal.  GI: Soft. She exhibits no distension. There is no tenderness.  Neurological: She is alert and oriented to person, place, and time.  Skin: Skin is warm and dry.  Psychiatric: She has a normal mood and affect.    MAU Course  Procedures    MDM Discussed with Amber Sanders, CNM. Patient has irregular GS and less than expected growth since last [place] approximately 1 week ago.  [place] and I discussed options with the patient including repeat US in one week to evaluate for viability, waiting for patient to spontaneously abort or giving cytotec today due to the appearance of the gestational sac on prior two [place] and the likelihood of a miscarriage at this time.   Assessment and Plan  A:  Abdominal pain in pregnancy Probable failed IUP  P: Rx for toradol given. Patient may continue Percocet previously prescribed as well.  Order for follow-up US sent for 1 week. Patient will be contacted by Radiology at [facility] to schedule.  Patient instructed to return to [facility] if her condition were to change or worsen. She has been cautioned  that she may experience a worsening of her cramping and possibly vaginal bleeding.   Freddi Starr, PA-C 07/02/2012, 12:26 PM    Freddi Starr, PA-C 07/02/12 1913

## 2012-07-02 NOTE — MAU Note (Signed)
Patient states she has been seen at [facility] for the abdominal pain and given Percocet that does not help. Denies bleeding or vaginal discharge. Had a headache yesterday, not today.

## 2012-07-02 NOTE — ED Provider Notes (Signed)
Chart reviewed and agree with management and plan.  

## 2012-07-02 NOTE — MAU Note (Signed)
States was advised to F/U in 7-10 days, was not given a specific day.

## 2012-07-09 ENCOUNTER — Ambulatory Visit (HOSPITAL_COMMUNITY)
Admission: RE | Admit: 2012-07-09 | Discharge: 2012-07-09 | Disposition: A | Discharge status: Home / Self Care | Payer: Self-pay | Source: Ambulatory Visit | Attending: Medical | Admitting: Medical

## 2012-07-09 ENCOUNTER — Inpatient Hospital Stay (HOSPITAL_COMMUNITY)
Admission: AD | Admit: 2012-07-09 | Discharge: 2012-07-09 | Disposition: A | Discharge status: Home / Self Care | Payer: Medicaid Other | Source: Ambulatory Visit | Attending: Obstetrics and Gynecology | Admitting: Obstetrics and Gynecology

## 2012-07-09 DIAGNOSIS — O021 Missed abortion: Secondary | ICD-10-CM | POA: Insufficient documentation

## 2012-07-09 DIAGNOSIS — Z3689 Encounter for other specified antenatal screening: Secondary | ICD-10-CM | POA: Insufficient documentation

## 2012-07-09 DIAGNOSIS — O039 Complete or unspecified spontaneous abortion without complication: Secondary | ICD-10-CM

## 2012-07-09 NOTE — MAU Provider Note (Signed)
Attestation of Attending Supervision of Advanced Practitioner (CNM/NP): Evaluation and management procedures were performed by the Advanced Practitioner under my supervision and collaboration.  I have reviewed the Advanced Practitioner's note and chart, and I agree with the management and plan.  Nashia Remus 07/09/2012 6:05 PM

## 2012-07-09 NOTE — MAU Provider Note (Signed)
HPI [name] is a 26 year old G3 P2002 who presents today for follow-up US. The patient states that she had some cramping and bleeding last Friday and Saturday. She has not had any since then. She denies vaginal bleeding, discharge, pelvic or abdominal pain at this time.   MDM The patient was seen in MAU on 07/02/12 and US showed a single IUGS with irregular shape and less than expected growth. At that time it could not definitively be called a failed pregnancy.   Follow-up US today was definitive for a failed pregnancy.   Discussed options for cytotec vs. expectant management with the patient today. She has chosen expectant management at this time.    A: Failed pregnancy  P: Patient cautioned that she may experience more cramping and bleeding. She will follow-up in GYN clinic in approximately 2 weeks Patient cautioned to return to MAU if she experiences very heavy bleeding and feels weak or dizzy or if her pain is not adequately controlled with previously prescribed pain medication.  Patient voices understanding of options and plan of care at this time.   Freddi Starr, PA-C 07/09/2012 4:35 PM

## 2012-07-11 ENCOUNTER — Encounter: Payer: Self-pay | Admitting: Advanced Practice Midwife

## 2012-07-12 ENCOUNTER — Emergency Department (HOSPITAL_COMMUNITY): Payer: Self-pay

## 2012-07-12 ENCOUNTER — Encounter (HOSPITAL_COMMUNITY): Payer: Self-pay | Admitting: *Deleted

## 2012-07-12 ENCOUNTER — Emergency Department (HOSPITAL_COMMUNITY)
Admission: EM | Admit: 2012-07-12 | Discharge: 2012-07-13 | Disposition: A | Discharge status: Home / Self Care | Payer: Self-pay | Attending: Emergency Medicine | Admitting: Emergency Medicine

## 2012-07-12 DIAGNOSIS — R109 Unspecified abdominal pain: Secondary | ICD-10-CM | POA: Insufficient documentation

## 2012-07-12 DIAGNOSIS — Z8619 Personal history of other infectious and parasitic diseases: Secondary | ICD-10-CM | POA: Insufficient documentation

## 2012-07-12 DIAGNOSIS — O169 Unspecified maternal hypertension, unspecified trimester: Secondary | ICD-10-CM | POA: Insufficient documentation

## 2012-07-12 DIAGNOSIS — O9989 Other specified diseases and conditions complicating pregnancy, childbirth and the puerperium: Secondary | ICD-10-CM | POA: Insufficient documentation

## 2012-07-12 DIAGNOSIS — O034 Incomplete spontaneous abortion without complication: Secondary | ICD-10-CM | POA: Insufficient documentation

## 2012-07-12 LAB — CBC WITH DIFFERENTIAL/PLATELET
Hemoglobin: 10.2 g/dL — ABNORMAL LOW (ref 12.0–15.0)
Lymphocytes Relative: 31 % (ref 12–46)
Lymphs Abs: 2.7 10*3/uL (ref 0.7–4.0)
MCH: 26.4 pg (ref 26.0–34.0)
Monocytes Relative: 6 % (ref 3–12)
Neutro Abs: 5.2 10*3/uL (ref 1.7–7.7)
Neutrophils Relative %: 59 % (ref 43–77)
RBC: 3.86 MIL/uL — ABNORMAL LOW (ref 3.87–5.11)

## 2012-07-12 LAB — URINALYSIS, ROUTINE W REFLEX MICROSCOPIC
Glucose, UA: NEGATIVE mg/dL
Hgb urine dipstick: NEGATIVE
Ketones, ur: NEGATIVE mg/dL
Protein, ur: NEGATIVE mg/dL

## 2012-07-12 LAB — HCG, QUANTITATIVE, PREGNANCY: hCG, Beta Chain, Quant, S: 151144 m[IU]/mL — ABNORMAL HIGH (ref <5)

## 2012-07-12 LAB — POCT I-STAT, CHEM 8
Chloride: 105 mEq/L (ref 96–112)
Glucose, Bld: 91 mg/dL (ref 70–99)
HCT: 31 % — ABNORMAL LOW (ref 36.0–46.0)
Potassium: 4.1 mEq/L (ref 3.5–5.1)

## 2012-07-12 LAB — WET PREP, GENITAL
Clue Cells Wet Prep HPF POC: NONE SEEN
Trich, Wet Prep: NONE SEEN
Yeast Wet Prep HPF POC: NONE SEEN

## 2012-07-12 LAB — POCT PREGNANCY, URINE: Preg Test, Ur: POSITIVE — AB

## 2012-07-12 MED ORDER — HYDROMORPHONE HCL PF 1 MG/ML IJ SOLN
1.0000 mg | Freq: Once | INTRAMUSCULAR | Status: AC
  Administered 2012-07-12: 1 mg via INTRAVENOUS
  Filled 2012-07-12: qty 1

## 2012-07-12 MED ORDER — SODIUM CHLORIDE 0.9 % IV SOLN
Freq: Once | INTRAVENOUS | Status: AC
  Administered 2012-07-12: 23:00:00 via INTRAVENOUS

## 2012-07-12 MED ORDER — ONDANSETRON HCL 4 MG/2ML IJ SOLN
4.0000 mg | Freq: Once | INTRAMUSCULAR | Status: AC
  Administered 2012-07-12: 4 mg via INTRAVENOUS
  Filled 2012-07-12: qty 2

## 2012-07-12 NOTE — ED Notes (Signed)
Pt c/o generalized abdominal pain, worsening since Wed. Pt recently has miscarriage x 1 week ago and has had f/u US at GYN. Pt denies heavy vaginal bleeding but c/o generalized abdominal pain which is worsening since this morning.

## 2012-07-12 NOTE — ED Provider Notes (Signed)
History     CSN: [id number]  Arrival date & time 07/12/12  2024   First MD Initiated Contact with Patient 07/12/12 2223      Chief Complaint  Patient presents with  . Abdominal Pain    (Consider location/radiation/quality/duration/timing/severity/associated sxs/prior treatment) HPI Comments: Patient is a 26 year old woman who has had lower abdominal pain since 6:30 this morning. She says the pain is unbearable. She denies fever. She has some mild vaginal bleeding. She has undergone evaluation at [facility] hospital, most recently 3 days ago, at which time she had a pelvic ultrasound which showed a small corpus luteum, thickened heterogenous endometrium with scattered cystic foci, one fetus but cardiac activity. According to the notes from [facility] hospital on that day, she was offered Cytotec versus spectrum treatment and chose expectant treatment. The patient thinks she has passed the fetus, but the ultrasound showed a fetus on 07/09/2012.  Patient is a 26 y.o. female presenting with abdominal pain. The history is provided by the patient and medical records.  Abdominal Pain The primary symptoms of the illness include abdominal pain and vaginal bleeding. The primary symptoms of the illness do not include fever.  Symptoms associated with the illness do not include chills.    Past Medical History  Diagnosis Date  . Sinus headache   . Hypertension   . Trichomonas   . Chlamydia     History reviewed. No pertinent past surgical history.  Family History  Problem Relation Age of Onset  . Cancer Father     History  Substance Use Topics  . Smoking status: Never Smoker   . Smokeless tobacco: Never Used  . Alcohol Use: No    OB History    Grav Para Term Preterm Abortions TAB SAB Ect Mult Living   3 2 2       2       Review of Systems  Constitutional: Negative for fever and chills.  HENT: Negative.   Eyes: Negative.   Respiratory: Negative.   Cardiovascular: Negative.    Gastrointestinal: Positive for abdominal pain.  Genitourinary: Positive for vaginal bleeding.  Musculoskeletal: Negative.   Neurological: Negative.   Psychiatric/Behavioral: Negative.     Allergies  Review of patient's allergies indicates no known allergies.  Home Medications   Current Outpatient Rx  Name  Route  Sig  Dispense  Refill  . ACETAMINOPHEN 500 MG PO TABS   Oral   Take 500 mg by mouth every 6 (six) hours as needed. Pain.         Marland Kitchen KETOROLAC TROMETHAMINE 10 MG PO TABS   Oral   Take 1 tablet (10 mg total) by mouth every 6 (six) hours as needed for pain.   20 tablet   0   . OXYCODONE-ACETAMINOPHEN 5-325 MG PO TABS   Oral   Take 1-2 tablets by mouth every 4 (four) hours as needed for pain.   30 tablet   0     BP 124/77  Pulse 82  Temp 98.1 F (36.7 C) (Oral)  Resp 16  Ht 5\' 6"  (1.676 m)  Wt 264 lb (119.75 kg)  BMI 42.61 kg/m2  SpO2 98%  LMP 05/15/2012  Physical Exam  Nursing note and vitals reviewed. Constitutional: She appears well-developed and well-nourished.       In moderate to severe distress with lower abdominal pain.  HENT:  Head: Normocephalic and atraumatic.  Right Ear: External ear normal.  Left Ear: External ear normal.  Mouth/Throat: Oropharynx is clear and moist.  Eyes: Conjunctivae normal and EOM are normal. Pupils are equal, round, and reactive to light.  Neck: Normal range of motion. Neck supple.  Cardiovascular: Normal rate, regular rhythm and normal heart sounds.   Pulmonary/Chest: Effort normal and breath sounds normal.  Abdominal: Soft.       Pain is localized to the lower abdomen. There is no mass or tenderness.  Genitourinary:       Pelvic exam shows normal female external genitalia. There is minimal discharge in the vagina. The uterus is difficult to palpate because the patient's body habitus. She has pelvic tenderness on bimanual exam.    ED Course  Procedures (including critical care time)  Results for orders placed  during the hospital encounter of 07/12/12  URINALYSIS, ROUTINE W REFLEX MICROSCOPIC      Component Value Range   Color, Urine YELLOW  YELLOW   APPearance CLEAR  CLEAR   Specific Gravity, Urine 1.038 (*) 1.005 - 1.030   pH 6.5  5.0 - 8.0   Glucose, UA NEGATIVE  NEGATIVE mg/dL   Hgb urine dipstick NEGATIVE  NEGATIVE   Bilirubin Urine NEGATIVE  NEGATIVE   Ketones, ur NEGATIVE  NEGATIVE mg/dL   Protein, ur NEGATIVE  NEGATIVE mg/dL   Urobilinogen, UA 0.2  0.0 - 1.0 mg/dL   Nitrite NEGATIVE  NEGATIVE   Leukocytes, UA NEGATIVE  NEGATIVE  POCT PREGNANCY, URINE      Component Value Range   Preg Test, Ur POSITIVE (*) NEGATIVE  POCT I-STAT, CHEM 8      Component Value Range   Sodium 136  135 - 145 mEq/L   Potassium 4.1  3.5 - 5.1 mEq/L   Chloride 105  96 - 112 mEq/L   BUN 17  6 - 23 mg/dL   Creatinine, Ser 1.61  0.50 - 1.10 mg/dL   Glucose, Bld 91  70 - 99 mg/dL   Calcium, Ion 0.96 (*) 1.12 - 1.23 mmol/L   TCO2 24  0 - 100 mmol/L   Hemoglobin 10.5 (*) 12.0 - 15.0 g/dL   HCT 04.5 (*) 40.9 - 81.1 %  CBC WITH DIFFERENTIAL      Component Value Range   WBC 8.9  4.0 - 10.5 K/uL   RBC 3.86 (*) 3.87 - 5.11 MIL/uL   Hemoglobin 10.2 (*) 12.0 - 15.0 g/dL   HCT 91.4 (*) 78.2 - 95.6 %   MCV 79.0  78.0 - 100.0 fL   MCH 26.4  26.0 - 34.0 pg   MCHC 33.4  30.0 - 36.0 g/dL   RDW 21.3  08.6 - 57.8 %   Platelets 351  150 - 400 K/uL   Neutrophils Relative 59  43 - 77 %   Neutro Abs 5.2  1.7 - 7.7 K/uL   Lymphocytes Relative 31  12 - 46 %   Lymphs Abs 2.7  0.7 - 4.0 K/uL   Monocytes Relative 6  3 - 12 %   Monocytes Absolute 0.5  0.1 - 1.0 K/uL   Eosinophils Relative 4  0 - 5 %   Eosinophils Absolute 0.4  0.0 - 0.7 K/uL   Basophils Relative 0  0 - 1 %   Basophils Absolute 0.0  0.0 - 0.1 K/uL  WET PREP, GENITAL      Component Value Range   Yeast Wet Prep HPF POC NONE SEEN  NONE SEEN   Trich, Wet Prep NONE SEEN  NONE SEEN   Clue Cells Wet Prep HPF POC NONE SEEN  NONE SEEN  WBC, Wet Prep HPF POC  FEW (*) NONE SEEN   I called [facility] MAU and discussed pt's condition with the nurse practitioner, who recommended getting quantitative HCG and pelvic ultrasound to further assess her condition.  IV fluids and IV pain medications were ordered.  12:06 AM Quantitative HCG is 151,144.  Going to ultrasound now.  Pt's care signed out to Dr. Azalia Bilis, who completed her evaluation and arranged her disposition.   1. Abortion, incomplete           Carleene Cooper III, MD 07/13/12 [phone]

## 2012-07-12 NOTE — ED Notes (Signed)
RN made aware

## 2012-07-12 NOTE — ED Notes (Signed)
IV attempted times two unsuccessful,  Blood was drawn but IV blew.

## 2012-07-12 NOTE — ED Notes (Signed)
Lab draw attempted x2 with no success. 

## 2012-07-13 LAB — GC/CHLAMYDIA PROBE AMP
CT Probe RNA: NEGATIVE
GC Probe RNA: NEGATIVE

## 2012-07-13 MED ORDER — OXYCODONE-ACETAMINOPHEN 5-325 MG PO TABS: 1.0000 | ORAL_TABLET | ORAL | Status: DC | PRN

## 2012-07-13 NOTE — ED Notes (Signed)
Ultrasound at bedside and Autumn RN  chaperoning.

## 2012-07-13 NOTE — ED Provider Notes (Signed)
1:44 AM i spoke with Dr Despina Hidden, on call ob gyn who recommends follow up in clinic on Monday afternoon. Likely d and c next week if does not pass on own. No vaginal bleeding.  Patient understands to followup at [facility] hospital for any new or worsening symptoms including worsening abdominal pain or development of vaginal bleeding.  Home with Percocet.  She has no home pain medications at this time.  The encounter diagnosis was Abortion, incomplete.  US Ob Comp Less 14 Wks  07/13/2012  *RADIOLOGY REPORT*  Clinical Data: Abdominal pain, evaluate for missed abortion.  OBSTETRIC <14 WK [place] AND TRANSVAGINAL OB US  Technique:  Both transabdominal and transvaginal ultrasound examinations were performed for complete evaluation of the gestation as well as the maternal uterus, adnexal regions, and pelvic cul-de-sac.  Transvaginal technique was performed to assess early pregnancy.  Comparison:  07/09/2029  Intrauterine gestational sac:  Not identified  Maternal uterus/adnexae: Right ovary not visualized.  Left ovary measures 3.7 x 1.3 x 2.0 cm.  No free fluid.  Mixed echogenicity distends the endometrium, with internal cystic components. Measures up to 8.7 x 6.8 cm.  Appearance is similar to recent comparison.  IMPRESSION: No intrauterine gestational sac.  The endometrium is distended with mixed attenuation collection or mass measuring 8.7 x 6.8 cm. Correlate with quantitative beta HCG level. Differential includes retained products and molar pregnancy.   Original Report Authenticated By: Jearld Lesch, M.D.    US Ob Comp Less 14 Wks  06/25/2012  *RADIOLOGY REPORT*  Clinical Data: Abdominal pain and cramping.  Positive pregnancy test.  Unsure of LMP.  OBSTETRIC <14 WK [place] AND TRANSVAGINAL OB US  Technique:  Both transabdominal and transvaginal ultrasound examinations were performed for complete evaluation of the gestation as well as the maternal uterus, adnexal regions, and pelvic cul-de-sac.  Transvaginal technique was  performed to assess early pregnancy.  Comparison:  None.  Intrauterine gestational sac:  Single irregular in shape Yolk sac: Visualized Embryo: Not visualized  MSD: 14 mm  6 w 2 d  Maternal uterus/adnexae: Normal appearance of right ovary.  Nonvisualization of left ovary, no adnexal mass or free fluid identified.  IMPRESSION: Single intrauterine gestational sac measuring 6 weeks 2 days has very irregular shape, which is poor prognostic sign.  Recommend continued follow-up of quantitative beta HCG levels, and follow-up by ultrasound in 7-10 days if clinically warranted.   Original Report Authenticated By: Myles Rosenthal, M.D.    US Ob Transvaginal  07/13/2012  *RADIOLOGY REPORT*  Clinical Data: Abdominal pain, evaluate for missed abortion.  OBSTETRIC <14 WK [place] AND TRANSVAGINAL OB US  Technique:  Both transabdominal and transvaginal ultrasound examinations were performed for complete evaluation of the gestation as well as the maternal uterus, adnexal regions, and pelvic cul-de-sac.  Transvaginal technique was performed to assess early pregnancy.  Comparison:  07/09/2029  Intrauterine gestational sac:  Not identified  Maternal uterus/adnexae: Right ovary not visualized.  Left ovary measures 3.7 x 1.3 x 2.0 cm.  No free fluid.  Mixed echogenicity distends the endometrium, with internal cystic components. Measures up to 8.7 x 6.8 cm.  Appearance is similar to recent comparison.  IMPRESSION: No intrauterine gestational sac.  The endometrium is distended with mixed attenuation collection or mass measuring 8.7 x 6.8 cm. Correlate with quantitative beta HCG level. Differential includes retained products and molar pregnancy.   Original Report Authenticated By: Jearld Lesch, M.D.    US Ob Transvaginal  07/09/2012  OBSTETRICAL ULTRASOUND: This exam was  performed within a [facility]. The OB US report was generated in the AS system, and faxed to the ordering physician.   This report is also  available in [employer] and in the [employer]. See AS Obstetric US report.   US Ob Transvaginal  07/02/2012  OBSTETRICAL ULTRASOUND: This exam was performed within a  Ultrasound Department. The OB US report was generated in the AS system, and faxed to the ordering physician.   This report is also available in [employer] and in the [employer]. See [facility] Obstetric US report.   US Ob Transvaginal  06/25/2012  *RADIOLOGY REPORT*  Clinical Data: Abdominal pain and cramping.  Positive pregnancy test.  Unsure of LMP.  OBSTETRIC <14 WK [place] AND TRANSVAGINAL OB US  Technique:  Both transabdominal and transvaginal ultrasound examinations were performed for complete evaluation of the gestation as well as the maternal uterus, adnexal regions, and pelvic cul-de-sac.  Transvaginal technique was performed to assess early pregnancy.  Comparison:  None.  Intrauterine gestational sac:  Single irregular in shape Yolk sac: Visualized Embryo: Not visualized  MSD: 14 mm  6 w 2 d  Maternal uterus/adnexae: Normal appearance of right ovary.  Nonvisualization of left ovary, no adnexal mass or free fluid identified.  IMPRESSION: Single intrauterine gestational sac measuring 6 weeks 2 days has very irregular shape, which is poor prognostic sign.  Recommend continued follow-up of quantitative beta HCG levels, and follow-up by ultrasound in 7-10 days if clinically warranted.   Original Report Authenticated By: Myles Rosenthal, M.D.   I personally reviewed the imaging tests through PACS system I reviewed available ER/hospitalization records through the EMR Results for orders placed during the hospital encounter of 07/12/12  URINALYSIS, ROUTINE W REFLEX MICROSCOPIC      Component Value Range   Color, Urine YELLOW  YELLOW   APPearance CLEAR  CLEAR   Specific Gravity, Urine 1.038 (*) 1.005 - 1.030   pH 6.5  5.0 - 8.0   Glucose, UA NEGATIVE  NEGATIVE mg/dL   Hgb urine dipstick  NEGATIVE  NEGATIVE   Bilirubin Urine NEGATIVE  NEGATIVE   Ketones, ur NEGATIVE  NEGATIVE mg/dL   Protein, ur NEGATIVE  NEGATIVE mg/dL   Urobilinogen, UA 0.2  0.0 - 1.0 mg/dL   Nitrite NEGATIVE  NEGATIVE   Leukocytes, UA NEGATIVE  NEGATIVE  POCT PREGNANCY, URINE      Component Value Range   Preg Test, Ur POSITIVE (*) NEGATIVE  POCT I-STAT, CHEM 8      Component Value Range   Sodium 136  135 - 145 mEq/L   Potassium 4.1  3.5 - 5.1 mEq/L   Chloride 105  96 - 112 mEq/L   BUN 17  6 - 23 mg/dL   Creatinine, Ser 0.98  0.50 - 1.10 mg/dL   Glucose, Bld 91  70 - 99 mg/dL   Calcium, Ion 1.19 (*) 1.12 - 1.23 mmol/L   TCO2 24  0 - 100 mmol/L   Hemoglobin 10.5 (*) 12.0 - 15.0 g/dL   HCT 14.7 (*) 82.9 - 56.2 %  HCG, QUANTITATIVE, PREGNANCY      Component Value Range   hCG, Beta Chain, Quant, S 130865 (*) <5 mIU/mL  CBC WITH DIFFERENTIAL      Component Value Range   WBC 8.9  4.0 - 10.5 K/uL   RBC 3.86 (*) 3.87 - 5.11 MIL/uL   Hemoglobin 10.2 (*) 12.0 - 15.0 g/dL   HCT 78.4 (*) 69.6 -  46.0 %   MCV 79.0  78.0 - 100.0 fL   MCH 26.4  26.0 - 34.0 pg   MCHC 33.4  30.0 - 36.0 g/dL   RDW 42.7  06.2 - 37.6 %   Platelets 351  150 - 400 K/uL   Neutrophils Relative 59  43 - 77 %   Neutro Abs 5.2  1.7 - 7.7 K/uL   Lymphocytes Relative 31  12 - 46 %   Lymphs Abs 2.7  0.7 - 4.0 K/uL   Monocytes Relative 6  3 - 12 %   Monocytes Absolute 0.5  0.1 - 1.0 K/uL   Eosinophils Relative 4  0 - 5 %   Eosinophils Absolute 0.4  0.0 - 0.7 K/uL   Basophils Relative 0  0 - 1 %   Basophils Absolute 0.0  0.0 - 0.1 K/uL  WET PREP, GENITAL      Component Value Range   Yeast Wet Prep HPF POC NONE SEEN  NONE SEEN   Trich, Wet Prep NONE SEEN  NONE SEEN   Clue Cells Wet Prep HPF POC NONE SEEN  NONE SEEN   WBC, Wet Prep HPF POC FEW (*) NONE SEEN     Lyanne Co, MD 07/13/12 [phone]

## 2012-07-15 ENCOUNTER — Encounter (HOSPITAL_COMMUNITY): Payer: Self-pay

## 2012-07-15 ENCOUNTER — Ambulatory Visit (INDEPENDENT_AMBULATORY_CARE_PROVIDER_SITE_OTHER): Payer: Self-pay | Admitting: Advanced Practice Midwife

## 2012-07-15 ENCOUNTER — Encounter: Payer: Self-pay | Admitting: Advanced Practice Midwife

## 2012-07-15 VITALS — BP 115/81 | HR 86 | Temp 97.9°F | Ht 66.0 in | Wt 266.0 lb

## 2012-07-15 DIAGNOSIS — O020 Blighted ovum and nonhydatidiform mole: Secondary | ICD-10-CM | POA: Insufficient documentation

## 2012-07-15 DIAGNOSIS — O0289 Other abnormal products of conception: Secondary | ICD-10-CM

## 2012-07-15 MED ORDER — DOXYCYCLINE HYCLATE 100 MG IV SOLR
200.0000 mg | INTRAVENOUS | Status: AC
  Administered 2012-07-16: 200 mg via INTRAVENOUS
  Filled 2012-07-15: qty 200

## 2012-07-15 MED ORDER — PROMETHAZINE HCL 25 MG PO TABS: 25.0000 mg | ORAL_TABLET | Freq: Four times a day (QID) | ORAL | Status: DC | PRN

## 2012-07-15 NOTE — Progress Notes (Signed)
  Subjective:    Patient ID: Amber Sanders, female    DOB: 09/28/1985, 26 y.o.   MRN: [id number]  HPI 26 y.o. [id number] here for f/u after SAB. Denies pain or bleeding. Seen at [facility] with abd pain on 12/3, quant 13000 at that time, quant 17000 in MAU on 12/4, u/s showed irregular gestational sac. Pt had some light vaginal bleeding. F/U u/s on 12/10 showed no gestational sac remaining, but tiny cystic foci in uterus. Pt declined cytotec at that time, opting for expectant mgmt. Was scheduled for 2 week f/u in GYN clinic. Pt seen at [facility] on 12/20 with abd pain, light bleeding, quant was 151000 at that time, u/s showed 8 cm cystic collection or mass in uterus.   Pt also c/o vomiting since 11 PM last night, no diarrhea, no fever.    Review of Systems  Constitutional: Negative.  Negative for fever.  Respiratory: Negative.   Cardiovascular: Negative.   Gastrointestinal: Positive for nausea and vomiting.  Genitourinary: Negative.   Musculoskeletal: Negative.   Skin: Negative.   Neurological: Negative.        Objective:   Physical Exam  Nursing note and vitals reviewed. Constitutional: She is oriented to person, place, and time. She appears well-developed and well-nourished. No distress.  Cardiovascular: Normal rate.   Pulmonary/Chest: Effort normal.  Abdominal:       Vomiting   Musculoskeletal: Normal range of motion.  Neurological: She is alert and oriented to person, place, and time.  Psychiatric: She has a normal mood and affect.          Assessment & Plan:  26 y.o. G3P2002 at [redacted]w[redacted]d M[place] Pregnancy D&C scheduled with Dr. Macon Large for tomorrow morning, pt to arrive at 9 AM, NPO after midnight Gastroenteritis - rx phenergan

## 2012-07-15 NOTE — Patient Instructions (Addendum)
Arrive in Maternity Admissions at 9 AM tomorrow morning. Nothing to eat or drink after midnight tonight.   Molar Pregnancy A molar pregnancy (hydatidiform mole) is a mass of tissue that grows in the uterus after conception. The mass is created by an egg that was not fertilized correctly and abnormally grows. It is an abnormal pregnancy and does not develop into a fetus. If suspected by your caregiver, treatment is required. CAUSES  Depending on the type of molar pregnancy, the cause may differ.  Complete molar pregnancy. The fertilized egg has all of the father's chromosomes and none of the mother's.  Partial molar pregnancy. There is too much genetic information from the father and mother. RISK FACTORS  Certain risk factors make a molar pregnancy more likely. They include:   Becoming pregnant after age 36 or under age 41.  History of a molar pregnancy in the past (extremely small chance of recurrence). Other possible risk factors include:   Smoking more than 15 cigarettes per day.  History of infertility.  Having a certain blood type (A, B, AB).  Having a vitamin A deficiency.  Using oral contraceptives. SYMPTOMS   Vaginal bleeding.  Missed menstrual period.  Uterus grows quicker than normal.  Severe nausea and vomiting.  Severe pressure or pain in the uterus.  Abnormal ovarian cysts (theca lutein cysts).  Discharge from the vagina that looks like grapes.  High blood pressure (early onset of preeclampsia).  Overactive thyroid (hyperthyroidism).  Anemia. DIAGNOSIS  If your caregiver thinks there is a chance of a molar pregnancy, testing will be recommended. Your caregiver can explain specific tests to you. Possible tests include, but are not limited to:   An ultrasound.  Blood tests. After diagnosis of a molar pregnancy, the pregnancy hormone levels must be followed until the level is 0. TREATMENT  Most molar pregnancies end on their own by miscarriage.  However, a caregiver needs to make sure that all the abnormal tissue is out of the womb. This can be done with:  Dilation and curettage (D&C) or suction curettage. This procedure removes any remaining molar tissue. It is taken out through the vagina.  Chemotherapy. If the pregnancy hormone level does not drop appropriately, chemotherapy may be necessary.  RhoGAM. This will be necessary if you are Rh negative and your sex partner is Rh positive, to prevent Rh problems with future pregnancies. HOME CARE INSTRUCTIONS   Avoid getting pregnant for 6 to 12 months or as directed by your caregiver. Use a reliable form of birth control or do not have sex.  Take all medicine as directed by your caregiver.  Keep all follow-up appointments and perform all suggested lab tests and ultrasounds.  Gradually return to normal activities.  Think about joining a support group. Ask for help if you are struggling with grief. Document Released: 03/28/2011 Document Revised: 10/02/2011 Document Reviewed: 03/28/2011 [facility] Patient Information 2013 [place], [place].   Dilation and Curettage or Vacuum Curettage Dilation and curettage (D&C) and vacuum curettage are minor procedures. A D&C involves stretching (dilation) the cervix and scraping (curettage) the inside lining of the womb (uterus). During a D&C, tissue is gently scraped from the inside lining of the uterus. During a vacuum curettage, the lining and tissue in the uterus are removed with the use of gentle suction. Curettage may be performed for diagnostic or therapeutic purposes. As a diagnostic procedure, curettage is performed for the purpose of examining tissues from the uterus. Tissue examination may help determine causes or treatment options  for symptoms. A diagnostic curettage may be performed for the following symptoms:  Irregular bleeding in the uterus.  Bleeding with the development of clots.  Spotting between menstrual periods.  Prolonged  menstrual periods.  Bleeding after menopause.  No menstrual period (amenorrhea).  A change in size and shape of the uterus. A therapeutic curettage is performed to remove tissue, blood, or a contraceptive device. Therapeutic curettage may be performed for the following conditions:   Removal of an IUD (intrauterine device).  Removal of retained placenta after giving birth. Retained placenta can cause bleeding severe enough to require transfusions or an infection.  Abortion.  Miscarriage.  Removal of polyps inside the uterus.  Removal of uncommon types of fibroids (noncancerous lumps). LET YOUR CAREGIVER KNOW ABOUT:   Allergies to food or medicine.  Medicines taken, including vitamins, herbs, eyedrops, over-the-counter medicines, and creams.  Use of steroids (by mouth or creams).  Previous problems with anesthetics or numbing medicines.  History of bleeding problems or blood clots.  Previous surgery.  Other health problems, including diabetes and kidney problems.  Possibility of pregnancy, if this applies. RISKS AND COMPLICATIONS   Excessive bleeding.  Infection of the uterus.  Damage to the cervix.  Development of scar tissue (adhesions) inside the uterus, later causing abnormal amounts of menstrual bleeding.  Complications from the general anesthetic, if a general anesthetic is used.  Putting a hole (perforation) in the uterus. This is rare. BEFORE THE PROCEDURE   Eat and drink before the procedure only as directed by your caregiver.  Arrange for someone to take you home. PROCEDURE   This procedure may be done in a hospital, outpatient clinic, or caregiver's office.  You may be given a general anesthetic or a local anesthetic in and around the cervix.  You will lie on your back with your legs in stirrups.  There are two ways in which your cervix can be softened and dilated. These include:  Taking a medicine.  Having thin rods (laminaria) inserted  into your cervix.  A curved tool (curette) will scrape cells from the inside lining of the uterus and will then be removed. This procedure usually takes about 15 to 30 minutes. AFTER THE PROCEDURE   You will rest in the recovery area until you are stable and are ready to go home.  You will need to have someone take you home.  You may feel sick to your stomach (nauseous) or throw up (vomit) if you had general anesthesia.  You may have a sore throat if a tube was placed in your throat during general anesthesia.  You may have light cramping and bleeding for 2 days to 2 weeks after the procedure.  Your uterus needs to make a new lining after the procedure. This may make your next period late. Document Released: 07/10/2005 Document Revised: 10/02/2011 Document Reviewed: 02/05/2009 [facility] Patient Information 2013 [place], [place].

## 2012-07-15 NOTE — Progress Notes (Signed)
Here for follow up from miscarriage. States started throwing up last night.

## 2012-07-16 ENCOUNTER — Encounter (HOSPITAL_COMMUNITY)
Admission: RE | Disposition: A | Discharge status: Home / Self Care | Payer: Self-pay | Source: Ambulatory Visit | Attending: Obstetrics & Gynecology

## 2012-07-16 ENCOUNTER — Encounter (HOSPITAL_COMMUNITY): Payer: Self-pay | Admitting: Certified Registered Nurse Anesthetist

## 2012-07-16 ENCOUNTER — Ambulatory Visit (HOSPITAL_COMMUNITY): Payer: Self-pay

## 2012-07-16 ENCOUNTER — Encounter (HOSPITAL_COMMUNITY): Payer: Self-pay | Admitting: *Deleted

## 2012-07-16 ENCOUNTER — Ambulatory Visit (HOSPITAL_COMMUNITY)
Admission: RE | Admit: 2012-07-16 | Discharge: 2012-07-16 | Disposition: A | Discharge status: Home / Self Care | Payer: MEDICAID | Source: Ambulatory Visit | Attending: Obstetrics & Gynecology | Admitting: Obstetrics & Gynecology

## 2012-07-16 ENCOUNTER — Ambulatory Visit (HOSPITAL_COMMUNITY): Payer: Self-pay | Admitting: Certified Registered Nurse Anesthetist

## 2012-07-16 DIAGNOSIS — O0289 Other abnormal products of conception: Secondary | ICD-10-CM | POA: Insufficient documentation

## 2012-07-16 DIAGNOSIS — O020 Blighted ovum and nonhydatidiform mole: Secondary | ICD-10-CM

## 2012-07-16 DIAGNOSIS — O021 Missed abortion: Secondary | ICD-10-CM

## 2012-07-16 HISTORY — PX: DILATION AND EVACUATION: SHX1459

## 2012-07-16 LAB — TYPE AND SCREEN
ABO/RH(D): B POS
Antibody Screen: NEGATIVE

## 2012-07-16 LAB — CBC
HCT: 32.7 % — ABNORMAL LOW (ref 36.0–46.0)
MCHC: 31.8 g/dL (ref 30.0–36.0)
Platelets: 339 10*3/uL (ref 150–400)
RDW: 15.1 % (ref 11.5–15.5)
WBC: 7.6 10*3/uL (ref 4.0–10.5)

## 2012-07-16 SURGERY — DILATION AND EVACUATION, UTERUS
Anesthesia: General | Site: Uterus | Wound class: Clean Contaminated

## 2012-07-16 MED ORDER — PROPOFOL 10 MG/ML IV EMUL
INTRAVENOUS | Status: DC | PRN
  Administered 2012-07-16: 180 mg via INTRAVENOUS

## 2012-07-16 MED ORDER — IBUPROFEN 600 MG PO TABS: 600.0000 mg | ORAL_TABLET | Freq: Four times a day (QID) | ORAL | Status: DC | PRN

## 2012-07-16 MED ORDER — KETOROLAC TROMETHAMINE 30 MG/ML IJ SOLN
INTRAMUSCULAR | Status: AC
  Filled 2012-07-16: qty 1

## 2012-07-16 MED ORDER — ONDANSETRON HCL 4 MG/2ML IJ SOLN
INTRAMUSCULAR | Status: DC | PRN
  Administered 2012-07-16: 4 mg via INTRAVENOUS

## 2012-07-16 MED ORDER — DEXAMETHASONE SODIUM PHOSPHATE 10 MG/ML IJ SOLN
INTRAMUSCULAR | Status: AC
  Filled 2012-07-16: qty 1

## 2012-07-16 MED ORDER — ONDANSETRON HCL 4 MG/2ML IJ SOLN
INTRAMUSCULAR | Status: AC
  Filled 2012-07-16: qty 2

## 2012-07-16 MED ORDER — PROPOFOL 10 MG/ML IV EMUL
INTRAVENOUS | Status: AC
  Filled 2012-07-16: qty 20

## 2012-07-16 MED ORDER — DEXAMETHASONE SODIUM PHOSPHATE 4 MG/ML IJ SOLN: 8.0000 mg | Freq: Once | INTRAMUSCULAR | Status: DC | PRN

## 2012-07-16 MED ORDER — MEPERIDINE HCL 25 MG/ML IJ SOLN: 6.2500 mg | INTRAMUSCULAR | Status: DC | PRN

## 2012-07-16 MED ORDER — DOCUSATE SODIUM 100 MG PO CAPS: 100.0000 mg | ORAL_CAPSULE | Freq: Two times a day (BID) | ORAL | Status: DC | PRN

## 2012-07-16 MED ORDER — FENTANYL CITRATE 0.05 MG/ML IJ SOLN: 25.0000 ug | INTRAMUSCULAR | Status: DC | PRN

## 2012-07-16 MED ORDER — KETOROLAC TROMETHAMINE 30 MG/ML IJ SOLN
15.0000 mg | Freq: Once | INTRAMUSCULAR | Status: AC | PRN
  Administered 2012-07-16: 30 mg via INTRAVENOUS

## 2012-07-16 MED ORDER — MIDAZOLAM HCL 2 MG/2ML IJ SOLN
INTRAMUSCULAR | Status: AC
  Filled 2012-07-16: qty 2

## 2012-07-16 MED ORDER — DEXAMETHASONE SODIUM PHOSPHATE 4 MG/ML IJ SOLN
INTRAMUSCULAR | Status: DC | PRN
  Administered 2012-07-16: 10 mg via INTRAVENOUS

## 2012-07-16 MED ORDER — BUPIVACAINE HCL (PF) 0.5 % IJ SOLN
INTRAMUSCULAR | Status: AC
  Filled 2012-07-16: qty 30

## 2012-07-16 MED ORDER — FENTANYL CITRATE 0.05 MG/ML IJ SOLN
INTRAMUSCULAR | Status: AC
  Filled 2012-07-16: qty 2

## 2012-07-16 MED ORDER — LACTATED RINGERS IV SOLN
INTRAVENOUS | Status: DC
  Administered 2012-07-16 (×2): via INTRAVENOUS

## 2012-07-16 MED ORDER — MISOPROSTOL 100 MCG PO TABS
ORAL_TABLET | ORAL | Status: DC | PRN
  Administered 2012-07-16: 800 ug

## 2012-07-16 MED ORDER — MIDAZOLAM HCL 5 MG/5ML IJ SOLN
INTRAMUSCULAR | Status: DC | PRN
  Administered 2012-07-16: 1 mg via INTRAVENOUS

## 2012-07-16 MED ORDER — OXYTOCIN 10 UNIT/ML IJ SOLN
INTRAMUSCULAR | Status: AC
  Filled 2012-07-16: qty 3

## 2012-07-16 MED ORDER — MISOPROSTOL 200 MCG PO TABS
ORAL_TABLET | ORAL | Status: AC
  Filled 2012-07-16: qty 4

## 2012-07-16 MED ORDER — KETOROLAC TROMETHAMINE 30 MG/ML IJ SOLN
INTRAMUSCULAR | Status: AC
  Administered 2012-07-16: 30 mg via INTRAVENOUS
  Filled 2012-07-16: qty 1

## 2012-07-16 MED ORDER — OXYTOCIN 40 UNITS IN LACTATED RINGERS INFUSION - SIMPLE MED
INTRAVENOUS | Status: DC | PRN
  Administered 2012-07-16: 30 [IU] via INTRAVENOUS

## 2012-07-16 MED ORDER — LIDOCAINE HCL (CARDIAC) 20 MG/ML IV SOLN
INTRAVENOUS | Status: AC
  Filled 2012-07-16: qty 5

## 2012-07-16 MED ORDER — FAMOTIDINE 20 MG PO TABS
20.0000 mg | ORAL_TABLET | Freq: Once | ORAL | Status: AC
  Administered 2012-07-16: 20 mg via ORAL

## 2012-07-16 MED ORDER — BUPIVACAINE HCL 0.5 % IJ SOLN
INTRAMUSCULAR | Status: DC | PRN
  Administered 2012-07-16: 30 mL

## 2012-07-16 MED ORDER — OXYCODONE-ACETAMINOPHEN 5-325 MG PO TABS: 1.0000 | ORAL_TABLET | Freq: Four times a day (QID) | ORAL | Status: DC | PRN

## 2012-07-16 MED ORDER — FAMOTIDINE 20 MG PO TABS
ORAL_TABLET | ORAL | Status: AC
  Administered 2012-07-16: 20 mg via ORAL
  Filled 2012-07-16: qty 1

## 2012-07-16 MED ORDER — LIDOCAINE HCL (CARDIAC) 20 MG/ML IV SOLN
INTRAVENOUS | Status: DC | PRN
  Administered 2012-07-16: 20 mg via INTRAVENOUS

## 2012-07-16 MED ORDER — FENTANYL CITRATE 0.05 MG/ML IJ SOLN
INTRAMUSCULAR | Status: DC | PRN
  Administered 2012-07-16: 50 ug via INTRAVENOUS

## 2012-07-16 SURGICAL SUPPLY — 17 items
CATH ROBINSON RED A/P 16FR (CATHETERS) ×2 IMPLANT
CLOTH BEACON ORANGE TIMEOUT ST (SAFETY) ×2 IMPLANT
DECANTER SPIKE VIAL GLASS SM (MISCELLANEOUS) ×2 IMPLANT
GLOVE BIO SURGEON STRL SZ7 (GLOVE) ×2 IMPLANT
GLOVE BIOGEL PI IND STRL 7.0 (GLOVE) ×1 IMPLANT
GLOVE BIOGEL PI INDICATOR 7.0 (GLOVE) ×1
GOWN STRL REIN XL XLG (GOWN DISPOSABLE) ×4 IMPLANT
KIT BERKELEY 1ST TRIMESTER 3/8 (MISCELLANEOUS) ×2 IMPLANT
NEEDLE SPNL 22GX3.5 QUINCKE BK (NEEDLE) ×2 IMPLANT
NS IRRIG 1000ML POUR BTL (IV SOLUTION) ×2 IMPLANT
PACK VAGINAL MINOR WOMEN LF (CUSTOM PROCEDURE TRAY) ×2 IMPLANT
PAD OB MATERNITY 4.3X12.25 (PERSONAL CARE ITEMS) ×2 IMPLANT
PAD PREP 24X48 CUFFED NSTRL (MISCELLANEOUS) ×2 IMPLANT
SET BERKELEY SUCTION TUBING (SUCTIONS) ×2 IMPLANT
SYR CONTROL 10ML LL (SYRINGE) ×2 IMPLANT
TOWEL OR 17X24 6PK STRL BLUE (TOWEL DISPOSABLE) ×4 IMPLANT
VACURETTE 10 RIGID CVD (CANNULA) ×2 IMPLANT

## 2012-07-16 NOTE — H&P (View-Only) (Signed)
  Subjective:    Patient ID: Amber Sanders, female    DOB: 09/11/1985, 26 y.o.   MRN: [id number]  HPI 26 y.o. G3P2002 here for f/u after SAB. Denies pain or bleeding. Seen at [facility] with abd pain on 12/3, quant 13000 at that time, quant 17000 in [facility]U on 12/4, u/s showed irregular gestational sac. Pt had some light vaginal bleeding. F/U u/s on 12/10 showed no gestational sac remaining, but tiny cystic foci in uterus. Pt declined cytotec at that time, opting for expectant mgmt. Was scheduled for 2 week f/u in GYN clinic. Pt seen at [facility] on 12/20 with abd pain, light bleeding, quant was 151000 at that time, u/s showed 8 cm cystic collection or mass in uterus.   Pt also c/o vomiting since 11 PM last night, no diarrhea, no fever.    Review of Systems  Constitutional: Negative.  Negative for fever.  Respiratory: Negative.   Cardiovascular: Negative.   Gastrointestinal: Positive for nausea and vomiting.  Genitourinary: Negative.   Musculoskeletal: Negative.   Skin: Negative.   Neurological: Negative.        Objective:   Physical Exam  Nursing note and vitals reviewed. Constitutional: She is oriented to person, place, and time. She appears well-developed and well-nourished. No distress.  Cardiovascular: Normal rate.   Pulmonary/Chest: Effort normal.  Abdominal:       Vomiting   Musculoskeletal: Normal range of motion.  Neurological: She is alert and oriented to person, place, and time.  Psychiatric: She has a normal mood and affect.          Assessment & Plan:  26 y.o. G3P2002 at [redacted]w[redacted]d Molar Pregnancy D&C scheduled with Dr. Anyanwu for tomorrow morning, pt to arrive at 9 AM, NPO after midnight Gastroenteritis - rx phenergan 

## 2012-07-16 NOTE — Transfer of Care (Signed)
Immediate Anesthesia Transfer of Care Note  Patient: Amber Sanders  Procedure(s) Performed: Procedure(s) (LRB) with comments: DILATATION AND EVACUATION (N/A)  Patient Location: PACU  Anesthesia Type:General  Level of Consciousness: sedated and patient cooperative  Airway & Oxygen Therapy: Patient Spontanous Breathing and Patient connected to nasal cannula oxygen  Post-op Assessment: Report given to PACU RN and Post -op Vital signs reviewed and stable  Post vital signs: Reviewed and stable  Complications: No apparent anesthesia complications

## 2012-07-16 NOTE — Anesthesia Postprocedure Evaluation (Signed)
  Anesthesia Post-op Note  Patient: Amber Sanders  Procedure(s) Performed: Procedure(s) (LRB) with comments: DILATATION AND EVACUATION (N/A)  Patient is awake and responsive. Pain and nausea are reasonably well controlled. Vital signs are stable and clinically acceptable. Oxygen saturation is clinically acceptable. There are no apparent anesthetic complications at this time. Patient is ready for discharge.

## 2012-07-16 NOTE — Op Note (Signed)
Amber Sanders PROCEDURE DATE: 07/16/2012  PREOPERATIVE DIAGNOSIS: Suspected molar pregnancy POSTOPERATIVE DIAGNOSIS: The same PROCEDURE:     Dilation and Evacuation SURGEON:  Dr. Jaynie Collins  INDICATIONS: 26 y.o. [id number] with suspected molar pregnancy; HCG today was [id number] and patient had an ultrasound showing a 8 cm vascular cystic mass in the uterus.  Risks of surgery were discussed with the patient including but not limited to: bleeding which may require transfusion; infection which may require antibiotics; injury to uterus or surrounding organs; need for additional procedures including laparotomy or laparoscopy; possibility of intrauterine scarring which may impair future fertility; and other postoperative/anesthesia complications. Written informed consent was obtained.    FINDINGS:  Moderate amounts of products of conception and blood, specimens sent to pathology. Complete evacuation confirmed by formal intraoperative ultrasound at the procedure.  ANESTHESIA:    Monitored intravenous sedation, paracervical block. INTRAVENOUS FLUIDS:  1800 ml of LR ESTIMATED BLOOD LOSS:  600 ml SPECIMENS:  Products of conception sent to pathology COMPLICATIONS:  None immediate.  PROCEDURE DETAILS:  The patient received intravenous Doxycycline while in the preoperative area.  She was then taken to the operating room where monitored intravenous sedation was administered and was found to be adequate.  After an adequate timeout was performed, she was placed in the dorsal lithotomy position and examined; then prepped and draped in the sterile manner.   Her bladder was catheterized for an unmeasured amount of clear, yellow urine. A vaginal speculum was then placed in the patient's vagina and a single tooth tenaculum was applied to the anterior lip of the cervix.  A paracervical block using 30 ml of 0.5% Marcaine was administered. The cervix was gently dilated to accommodate a 10 mm suction curette that was  gently advanced to the uterine fundus.  The suction device was then activated and curette slowly rotated to clear the uterus of products of conception. This was done several times and there was significant bleeding noted.  Patient was given IV fluid with pitocin and misoprostol 800 mcg placed per rectum.  Ultrasound was performed to confirm complete emptying of the uterus. There was minimal bleeding noted at the end of the procedure and the tenaculum removed with good hemostasis noted.   All instruments were removed from the patient's vagina. The patient tolerated the procedure well and was taken to the recovery area awake, and in stable condition.  The patient will be discharged to home as per PACU criteria.  Routine postoperative instructions given.  She was prescribed Percocet, Ibuprofen and Colace.  She will follow up in the clinic for further surveillance and management.

## 2012-07-16 NOTE — Anesthesia Preprocedure Evaluation (Signed)
Anesthesia Evaluation  Patient identified by MRN, date of birth, ID band Patient awake    Reviewed: Allergy & Precautions, H&P , NPO status , Patient's Chart, lab work & pertinent test results  Airway Mallampati: II TM Distance: >3 FB Neck ROM: full    Dental No notable dental hx. (+) Teeth Intact   Pulmonary neg pulmonary ROS,    Pulmonary exam normal       Cardiovascular negative cardio ROS      Neuro/Psych negative psych ROS   GI/Hepatic negative GI ROS, Neg liver ROS,   Endo/Other  Morbid obesity  Renal/GU negative Renal ROS  negative genitourinary   Musculoskeletal negative musculoskeletal ROS (+)   Abdominal Normal abdominal exam  (+)   Peds negative pediatric ROS (+)  Hematology negative hematology ROS (+)   Anesthesia Other Findings   Reproductive/Obstetrics negative OB ROS                           Anesthesia Physical Anesthesia Plan  ASA: III  Anesthesia Plan: General   Post-op Pain Management:    Induction: Intravenous  Airway Management Planned: Oral ETT  Additional Equipment:   Intra-op Plan:   Post-operative Plan: Extubation in OR  Informed Consent: I have reviewed the patients History and Physical, chart, labs and discussed the procedure including the risks, benefits and alternatives for the proposed anesthesia with the patient or authorized representative who has indicated his/her understanding and acceptance.   Dental Advisory Given  Plan Discussed with: CRNA and Surgeon  Anesthesia Plan Comments: (1. Patient has had nausea for days. 2. She is clinically dehydrated. 3. Will prehydrate with LR prior to induction)        Anesthesia Quick Evaluation

## 2012-07-16 NOTE — Transfer of Care (Signed)
Immediate Anesthesia Transfer of Care Note  Patient: Amber Sanders  Procedure(s) Performed: Procedure(s) (LRB) with comments: DILATATION AND EVACUATION (N/A)  Patient Location: PACU  Anesthesia Type:General  Level of Consciousness: awake, alert  and oriented  Airway & Oxygen Therapy: Patient Spontanous Breathing  Post-op Assessment: Report given to PACU RN  Post vital signs: Reviewed and stable  Complications: No apparent anesthesia complications

## 2012-07-16 NOTE — Interval H&P Note (Signed)
History and Physical Interval Note 07/16/2012 10:57 AM  Amber Sanders  has presented today for surgery, with the diagnosis of possible molar pregnancy  The various methods of treatment have been discussed with the patient and family. After consideration of risks, benefits and other options for treatment, the patient has consented to DILATATION AND EVACUATION as a surgical intervention .  The patient's history has been reviewed, patient examined, no change in status, stable for surgery.  I have reviewed the patient's chart and labs.  Questions were answered to the patient's satisfaction.  To OR when ready.  Jaynie Collins, MD, FACOG Attending Obstetrician & Gynecologist Faculty Practice, [facility]

## 2012-07-19 ENCOUNTER — Telehealth: Payer: Self-pay

## 2012-07-19 ENCOUNTER — Encounter (HOSPITAL_COMMUNITY): Payer: Self-pay | Admitting: Obstetrics & Gynecology

## 2012-07-19 NOTE — Telephone Encounter (Signed)
Appointment  made for 07/25/2012 @ 12:45 .

## 2012-07-19 NOTE — Telephone Encounter (Signed)
Called pt and left message on her personal voice mail that an appt has been scheduled for 07/25/12 @ 1245. This appt is to discuss results and plan of care. Please call back if you have questions.

## 2012-07-19 NOTE — Telephone Encounter (Signed)
Message copied by Faythe Casa on Fri Jul 19, 2012  9:23 AM ------      Message from: Jaynie Collins A      Created: Thu Jul 18, 2012  8:08 PM       Pathology consistent with complete molar pregnancy.  Patient should be scheduled for weekly surveillance quantitative HCG lab draws in clinic.  Patient should see any provider during her first surveillance visit to discuss pathology results, need for contraception during surveillance and to be informed about surveillance plan for molar pregnancy.

## 2012-07-25 ENCOUNTER — Encounter: Payer: Self-pay | Admitting: Obstetrics & Gynecology

## 2012-08-02 ENCOUNTER — Encounter: Payer: Self-pay | Admitting: Medical

## 2013-01-16 ENCOUNTER — Emergency Department (HOSPITAL_COMMUNITY)
Admission: EM | Admit: 2013-01-16 | Discharge: 2013-01-16 | Disposition: A | Discharge status: Home / Self Care | Payer: Self-pay | Attending: Emergency Medicine | Admitting: Emergency Medicine

## 2013-01-16 DIAGNOSIS — R05 Cough: Secondary | ICD-10-CM | POA: Insufficient documentation

## 2013-01-16 DIAGNOSIS — R059 Cough, unspecified: Secondary | ICD-10-CM | POA: Insufficient documentation

## 2013-01-16 DIAGNOSIS — J029 Acute pharyngitis, unspecified: Secondary | ICD-10-CM

## 2013-01-16 DIAGNOSIS — R51 Headache: Secondary | ICD-10-CM | POA: Insufficient documentation

## 2013-01-16 DIAGNOSIS — H9209 Otalgia, unspecified ear: Secondary | ICD-10-CM | POA: Insufficient documentation

## 2013-01-16 DIAGNOSIS — IMO0001 Reserved for inherently not codable concepts without codable children: Secondary | ICD-10-CM | POA: Insufficient documentation

## 2013-01-16 DIAGNOSIS — I10 Essential (primary) hypertension: Secondary | ICD-10-CM | POA: Insufficient documentation

## 2013-01-16 DIAGNOSIS — R599 Enlarged lymph nodes, unspecified: Secondary | ICD-10-CM | POA: Insufficient documentation

## 2013-01-16 DIAGNOSIS — R1310 Dysphagia, unspecified: Secondary | ICD-10-CM | POA: Insufficient documentation

## 2013-01-16 DIAGNOSIS — Z8619 Personal history of other infectious and parasitic diseases: Secondary | ICD-10-CM | POA: Insufficient documentation

## 2013-01-16 MED ORDER — ONDANSETRON 8 MG PO TBDP
8.0000 mg | ORAL_TABLET | Freq: Once | ORAL | Status: AC
  Administered 2013-01-16: 8 mg via ORAL
  Filled 2013-01-16: qty 1

## 2013-01-16 MED ORDER — OXYCODONE HCL 5 MG/5ML PO SOLN
5.0000 mg | Freq: Once | ORAL | Status: AC
  Administered 2013-01-16: 5 mg via ORAL
  Filled 2013-01-16: qty 5

## 2013-01-16 MED ORDER — DEXAMETHASONE SODIUM PHOSPHATE 10 MG/ML IJ SOLN
10.0000 mg | Freq: Once | INTRAMUSCULAR | Status: AC
  Administered 2013-01-16: 10 mg via INTRAMUSCULAR
  Filled 2013-01-16: qty 1

## 2013-01-16 MED ORDER — PENICILLIN V POTASSIUM 500 MG PO TABS: 500.0000 mg | ORAL_TABLET | Freq: Four times a day (QID) | ORAL | Status: DC

## 2013-01-16 MED ORDER — OXYCODONE-ACETAMINOPHEN 5-325 MG/5ML PO SOLN: 5.0000 mL | Freq: Every evening | ORAL | Status: DC | PRN

## 2013-01-16 NOTE — ED Notes (Signed)
Pt c/o sore throat with runny nose, cough and body aches for the past 3 days. Pt states now it is painful to eat or swallow. Pt states she has taken Tylenol and Motrin, but it is not helping. Pt with no acute distress. Ambulatory to exam room with steady gait. Pt states she has a ride home.

## 2013-01-16 NOTE — ED Provider Notes (Signed)
This chart was scribed for Junious Silk (PA) non-physician practitioner working with Lyanne Co, MD by Sofie Rower, ED Scribe. This patient was seen in room WTR7/WTR7 and the patient's care was started at 6:35Pm.   History    CSN: [id number] Arrival date & time 01/16/13  4098  First MD Initiated Contact with Patient 01/16/13 1835     Chief Complaint  Patient presents with  . Sore Throat   (Consider location/radiation/quality/duration/timing/severity/associated sxs/prior Treatment) The history is provided by the patient. No language interpreter was used.    Amber Sanders is a 27 y.o. female , with a hx of sinus headache, hypertension, trichomonas, chlamydia and dilation and evacuation (Performed on 07/16/12, by Dr. Macon Large at [facility] ORS) who presents to the Emergency Department complaining of gradual, progressively worsening, sore throat,  onset three days ago (01/13/13).  Associated symptoms include diffuse headache, non productive cough and mild otalgia. The pt reports she has been experiencing a sore throat for the past three days, which has inhibited her ability to eat and swallow. The pt informs she has taken tylenol and motrin, however, neither of which have provided any relief to her sore throat nor associated symptoms.   The pt denies rhinorrhea, abdominal pain, nausea, vomiting, and shortness of breath. Furthermore, the pt denies experiencing any sick contacts.   The pt does not smoke or drink alcohol.     Past Medical History  Diagnosis Date  . Sinus headache   . Hypertension   . Trichomonas   . Chlamydia    Past Surgical History  Procedure Laterality Date  . Dilation and evacuation  07/16/2012    Procedure: DILATATION AND EVACUATION;  Surgeon: Tereso Newcomer, MD;  Location: [facility] ORS;  Service: Gynecology;  Laterality: N/A;   Family History  Problem Relation Age of Onset  . Cancer Father    History  Substance Use Topics  . Smoking status: Never Smoker   .  Smokeless tobacco: Never Used  . Alcohol Use: No   OB History   Grav Para Term Preterm Abortions TAB SAB Ect Mult Living   3 2 2       2      Review of Systems  HENT: Positive for sore throat and trouble swallowing. Negative for rhinorrhea.   Respiratory: Positive for cough. Negative for shortness of breath.   Gastrointestinal: Negative for nausea, vomiting and abdominal pain.  Musculoskeletal: Positive for myalgias.  Neurological: Positive for headaches.  All other systems reviewed and are negative.    Allergies  Review of patient's allergies indicates no known allergies.  Home Medications   Current Outpatient Rx  Name  Route  Sig  Dispense  Refill  . acetaminophen (TYLENOL) 500 MG tablet   Oral   Take 1,000 mg by mouth every 6 (six) hours as needed for pain. Pain.          BP 141/84  Pulse 90  Temp(Src) 98.7 F (37.1 C) (Oral)  Resp 16  SpO2 98%  LMP 05/15/2012 Physical Exam  Nursing note and vitals reviewed. Constitutional: She is oriented to person, place, and time. She appears well-developed and well-nourished. No distress.  HENT:  Head: Normocephalic and atraumatic.  Mouth/Throat: Oropharyngeal exudate present.  Bilateral tonsillar enlargement with exudate. No uvula deviation. No trismus.   Eyes: EOM are normal.  Neck: Normal range of motion. Neck supple. No tracheal deviation present.  Cardiovascular: Normal rate, regular rhythm and normal heart sounds.   No murmur heard. Pulmonary/Chest: Effort normal  and breath sounds normal. No respiratory distress. She has no wheezes.  Musculoskeletal: Normal range of motion.  Lymphadenopathy:    She has no cervical adenopathy.  Neurological: She is alert and oriented to person, place, and time.  Skin: Skin is warm and dry.  Psychiatric: She has a normal mood and affect. Her behavior is normal.    ED Course  Procedures (including critical care time)  DIAGNOSTIC STUDIES: Oxygen Saturation is 98% on room air,  normal by my interpretation.    COORDINATION OF CARE:  6:47 PM- Treatment plan concerning decadron discussed with patient. Pt agrees with treatment.     Labs Reviewed  RAPID STREP SCREEN  CULTURE, GROUP A STREP   No results found. 1. Pharyngitis     MDM  Pt afebrile, but with tonsillar exudate, cervical lymphadenopathy, & dysphagia. Treated in the Ed with steroids, Pain medication and penicillin despite negative rapid strep.  Pt appears mildly dehydrated, discussed importance of water rehydration. Presentation non concerning for PTA or infxn spread to soft tissue. No trismus or uvula deviation. Specific return precautions discussed. Pt able to drink water in ED without difficulty with intact air way. Recommended PCP follow up.    I personally performed the services described in this documentation, which was scribed in my presence. The recorded information has been reviewed and is accurate.    Mora Bellman, PA-C 01/16/13 1920

## 2013-01-16 NOTE — ED Provider Notes (Signed)
Medical screening examination/treatment/procedure(s) were performed by non-physician practitioner and as supervising physician I was immediately available for consultation/collaboration.  Jymir Dunaj M Jamen Loiseau, MD 01/16/13 2344 

## 2013-01-18 LAB — CULTURE, GROUP A STREP

## 2013-06-03 ENCOUNTER — Emergency Department (HOSPITAL_COMMUNITY)
Admission: EM | Admit: 2013-06-03 | Discharge: 2013-06-03 | Disposition: A | Discharge status: Home / Self Care | Payer: Self-pay | Attending: Emergency Medicine | Admitting: Emergency Medicine

## 2013-06-03 ENCOUNTER — Encounter (HOSPITAL_COMMUNITY): Payer: Self-pay | Admitting: Emergency Medicine

## 2013-06-03 DIAGNOSIS — I10 Essential (primary) hypertension: Secondary | ICD-10-CM | POA: Insufficient documentation

## 2013-06-03 DIAGNOSIS — Z8619 Personal history of other infectious and parasitic diseases: Secondary | ICD-10-CM | POA: Insufficient documentation

## 2013-06-03 DIAGNOSIS — R51 Headache: Secondary | ICD-10-CM | POA: Insufficient documentation

## 2013-06-03 DIAGNOSIS — E669 Obesity, unspecified: Secondary | ICD-10-CM | POA: Insufficient documentation

## 2013-06-03 MED ORDER — SODIUM CHLORIDE 0.9 % IV BOLUS (SEPSIS)
1000.0000 mL | Freq: Once | INTRAVENOUS | Status: AC
  Administered 2013-06-03: 1000 mL via INTRAVENOUS

## 2013-06-03 MED ORDER — METOCLOPRAMIDE HCL 5 MG/ML IJ SOLN
10.0000 mg | Freq: Once | INTRAMUSCULAR | Status: AC
  Administered 2013-06-03: 10 mg via INTRAVENOUS
  Filled 2013-06-03: qty 2

## 2013-06-03 MED ORDER — KETOROLAC TROMETHAMINE 30 MG/ML IJ SOLN
30.0000 mg | Freq: Once | INTRAMUSCULAR | Status: AC
  Administered 2013-06-03: 30 mg via INTRAVENOUS
  Filled 2013-06-03: qty 1

## 2013-06-03 MED ORDER — DIPHENHYDRAMINE HCL 50 MG/ML IJ SOLN
25.0000 mg | Freq: Once | INTRAMUSCULAR | Status: AC
  Administered 2013-06-03: 25 mg via INTRAVENOUS
  Filled 2013-06-03: qty 1

## 2013-06-03 NOTE — ED Provider Notes (Signed)
Patient care assumed from Dr. Raeford Razor at shift change. Patient presents for headache. Neuro examination nonfocal today; no nuchal rigidity or meningismus. Dr. Juleen China without concern for emergent intracranial process given symptomatic history and physical examination. Patient tx in ED with migraine cocktail and IVF. Plan to monitor symptoms for improvement.  Patient states that headache has completely resolved. She feels comfortable managing her symptoms further at home. Patient hemodynamically stable, afebrile, and appropriate for discharge home with instruction to continue resting in a quiet, dark room. Return precautions discussed and patient agreeable to plan with no unaddressed concerns.  Antony Madura, PA-C 06/03/13 2203

## 2013-06-03 NOTE — ED Notes (Signed)
IV team at bedside 

## 2013-06-03 NOTE — ED Provider Notes (Signed)
CSN: [id number]     Arrival date & time 06/03/13  1415 History   First MD Initiated Contact with Patient 06/03/13 1733     Chief Complaint  Patient presents with  . Headache   (Consider location/radiation/quality/duration/timing/severity/associated sxs/prior Treatment) Patient is a 27 y.o. female presenting with headaches. The history is provided by the patient.  Headache Pain location:  R temporal and R parietal Quality:  Dull Radiates to:  Does not radiate Onset quality:  Gradual Duration:  3 days Timing:  Constant Progression:  Unchanged Context: not activity, not intercourse and not straining   Associated symptoms: no abdominal pain, no back pain, no pain, no fever, no focal weakness, no loss of balance, no near-syncope, no neck pain, no neck stiffness, no numbness, no paresthesias, no seizures, no syncope, no visual change and no vomiting    Gradual onset R sided HA 3d ago. Stable symptoms and constant. Throbbing. No trauma. No fever, chills, acute numbness/tingling/focal loss of strength.   Past Medical History  Diagnosis Date  . Sinus headache   . Hypertension   . Trichomonas   . Chlamydia    Past Surgical History  Procedure Laterality Date  . Dilation and evacuation  07/16/2012    Procedure: DILATATION AND EVACUATION;  Surgeon: Tereso Newcomer, MD;  Location: [facility];  Service: Gynecology;  Laterality: N/A;   Family History  Problem Relation Age of Onset  . Cancer Father    History  Substance Use Topics  . Smoking status: Never Smoker   . Smokeless tobacco: Never Used  . Alcohol Use: No   OB History   Grav Para Term Preterm Abortions TAB SAB Ect Mult Living   3 2 2       2      Review of Systems  Constitutional: Negative for fever.  Eyes: Negative for pain.  Cardiovascular: Negative for syncope and near-syncope.  Gastrointestinal: Negative for vomiting and abdominal pain.  Musculoskeletal: Negative for back pain, neck pain and neck stiffness.   Neurological: Positive for headaches. Negative for focal weakness, seizures, numbness, paresthesias and loss of balance.    All systems reviewed and negative, other than as noted in HPI.   Allergies  Review of patient's allergies indicates no known allergies.  Home Medications   Current Outpatient Rx  Name  Route  Sig  Dispense  Refill  . ibuprofen (ADVIL,MOTRIN) 200 MG tablet   Oral   Take 400 mg by mouth every 6 (six) hours as needed for headache.          BP 115/65  Pulse 88  Temp(Src) 98.1 F (36.7 C) (Oral)  Resp 16  Ht 5\' 6"  (1.676 m)  Wt 264 lb 6.4 oz (119.931 kg)  BMI 42.70 kg/m2  SpO2 99% Physical Exam  Nursing note and vitals reviewed. Constitutional: She is oriented to person, place, and time. She appears well-developed and well-nourished. No distress.  Laying in bed. NAD. Obese.  HENT:  Head: Normocephalic and atraumatic.  Eyes: Conjunctivae and EOM are normal. Pupils are equal, round, and reactive to light. Right eye exhibits no discharge. Left eye exhibits no discharge.  Neck: Normal range of motion. Neck supple.  No nuchal rigidity  Cardiovascular: Normal rate, regular rhythm and normal heart sounds.  Exam reveals no gallop and no friction rub.   No murmur heard. Pulmonary/Chest: Effort normal and breath sounds normal. No respiratory distress.  Abdominal: Soft. She exhibits no distension. There is no tenderness.  Musculoskeletal: She exhibits no edema and  no tenderness.  Neurological: She is alert and oriented to person, place, and time. No cranial nerve deficit. She exhibits normal muscle tone. Coordination normal.  Speech clear. Content appropriate. Good finger to nose b/l. Gait steady.   Skin: Skin is warm and dry.  Psychiatric: She has a normal mood and affect. Her behavior is normal. Thought content normal.    ED Course  Procedures (including critical care time) Labs Review Labs Reviewed - No data to display Imaging Review No results  found.  EKG Interpretation   None       MDM   1. Headache    27yF with HA. Suspect primary HA. Consider emergent secondary causes such as bleed, infectious or mass but doubt. There is no history of trauma. Pt has a nonfocal neurological exam. Afebrile and neck supple. No use of blood thinning medication. Consider ocular etiology such as acute angle closure glaucoma but doubt. Pt denies acute change in visual acuity and eye exam unremarkable. Doubt temporal arteritis given age, no temporal tenderness and temporal artery pulsations palpable. Doubt CO poisoning. No contacts with similar symptoms. Doubt venous thrombosis. Doubt carotid or vertebral arteries dissection. Symptoms improved with meds. Feel that can be safely discharged, but strict return precautions discussed. Outpt fu.     Raeford Razor, MD 06/08/13 [phone]

## 2013-06-03 NOTE — ED Notes (Signed)
Pt c/o HA on right side of head x 3 days with some generalized weakness

## 2013-06-03 NOTE — ED Notes (Signed)
Kohut MD at bedside. 

## 2013-06-04 NOTE — ED Provider Notes (Signed)
Medical screening examination/treatment/procedure(s) were performed by non-physician practitioner and as supervising physician I was immediately available for consultation/collaboration.  EKG Interpretation   None         Ezekial Arns B. Bernette Mayers, MD 06/04/13 [phone]

## 2013-07-24 NOTE — L&D Delivery Note (Signed)
Patient was C/C/2 and pushed for <5 minutes with epidural.   NSVD female infant, Apgars 8/9, weight pending.   The patient had a 1st laceration repaired with 3-0vicryl. Fundus was firm. EBL was expected amount. Placenta was delivered intact. Vagina was clear.  Baby was vigorous and doing skin to skin with mother.  Allyn Kenner

## 2013-08-17 IMAGING — CT CT HEAD W/O CM
1 series · 16 of 30 positions shown, 20 images · non-contrast
Comparison: No priors.

CLINICAL DATA: Right-sided headache for past 2 weeks.

CT HEAD WITHOUT CONTRAST
TECHNIQUE: Contiguous axial images were obtained from the base of
the skull through the vertex without contrast.

[Series 2: head routine 4.8 h37s · axial · 0.43mm/px · z∈[+199,+332]mm · 16 of 30 slices shown, 20 images]
[im 2/30  brain]
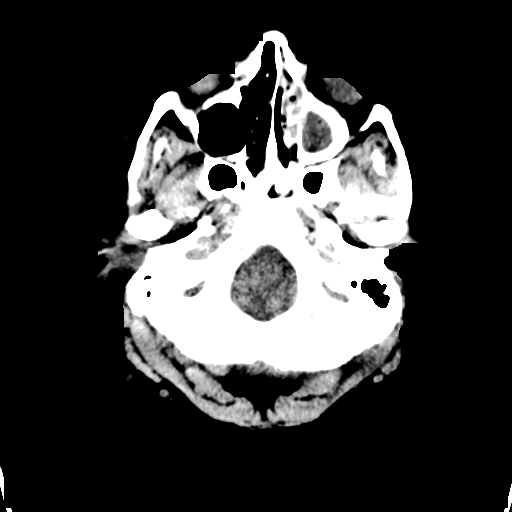
[im 2/30  bone]
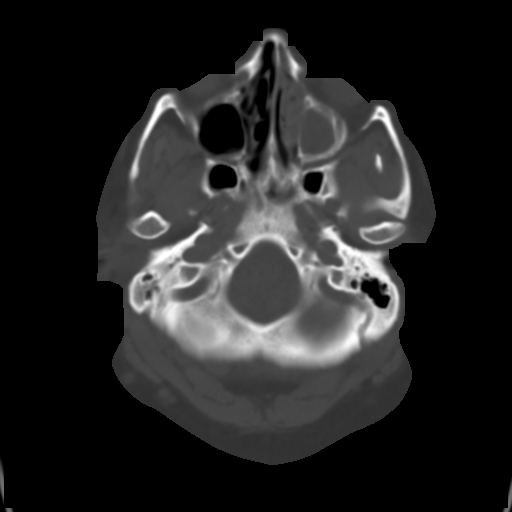
[im 4/30  brain]
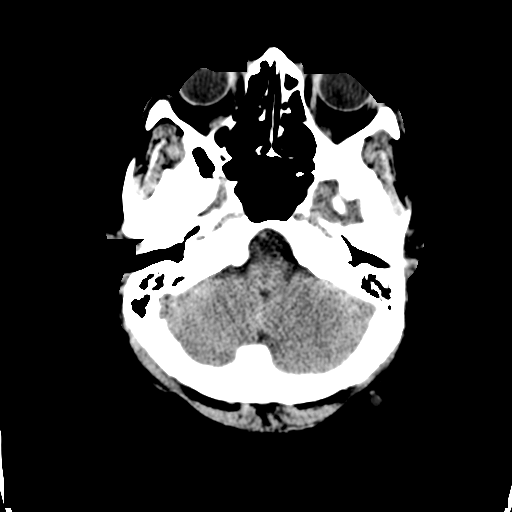
[im 6/30  brain]
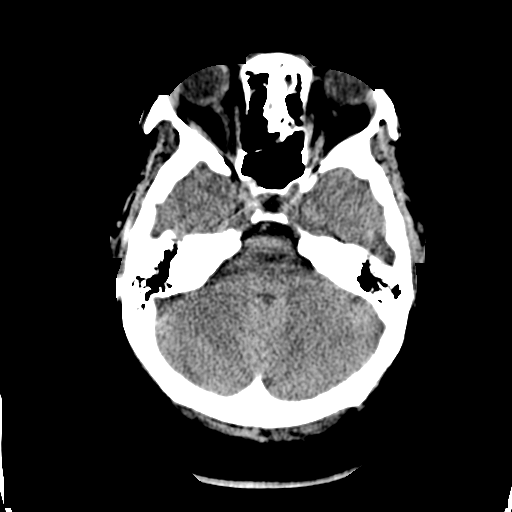
[im 8/30  brain]
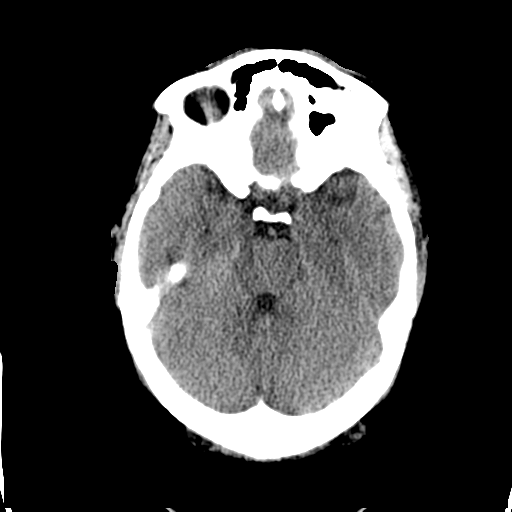
[im 9/30  brain]
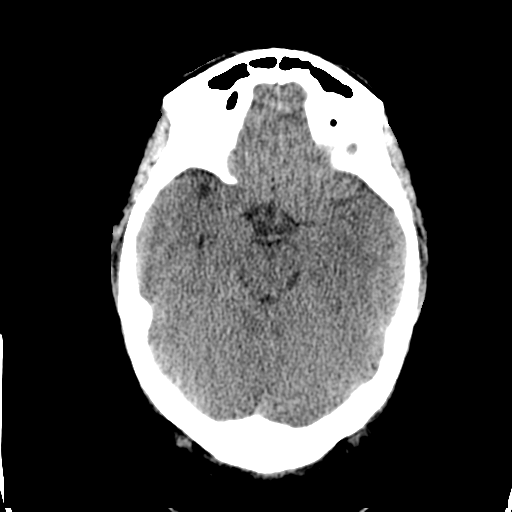
[im 9/30  bone]
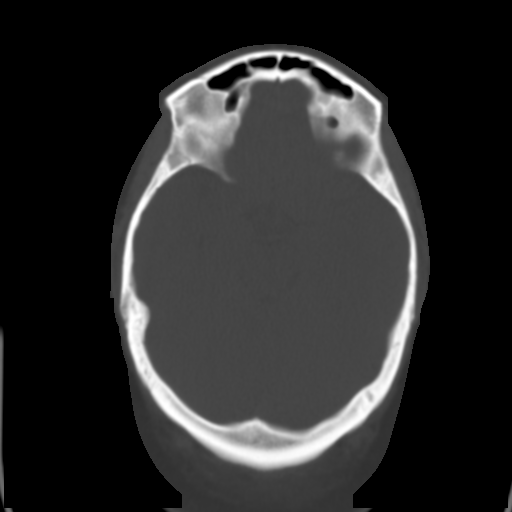
[im 11/30  brain]
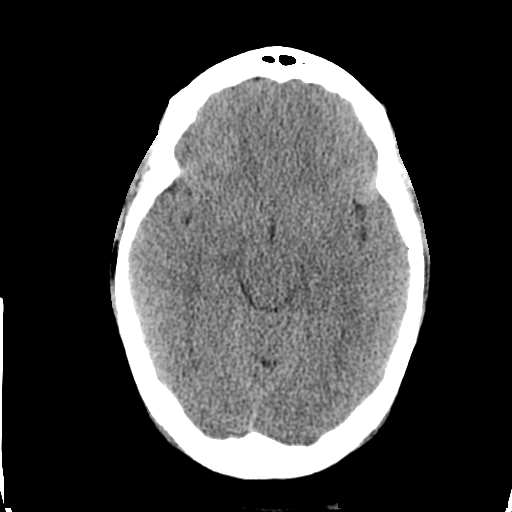
[im 13/30  brain]
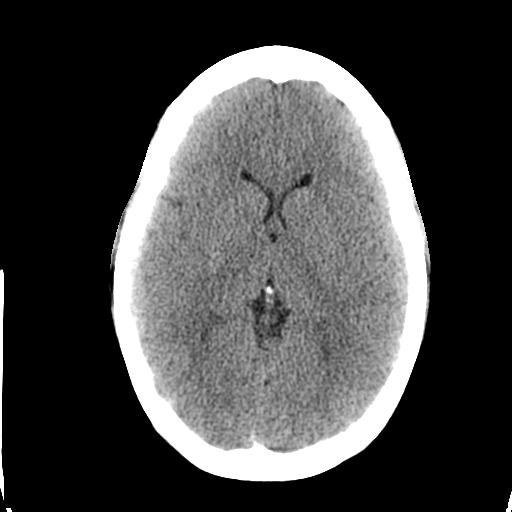
[im 15/30  brain]
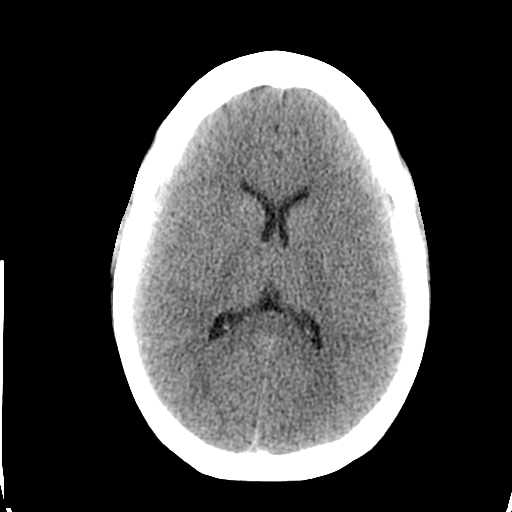
[im 16/30  brain]
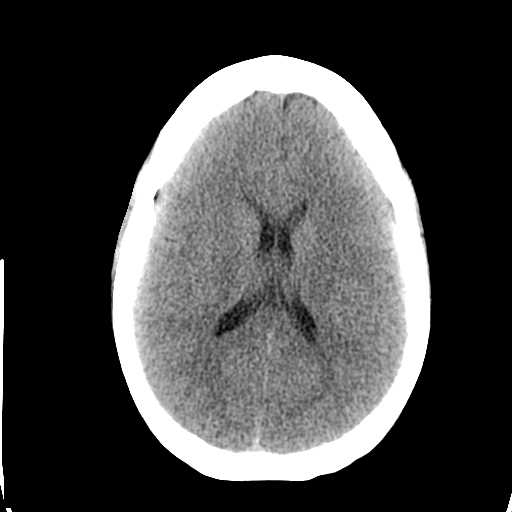
[im 16/30  bone]
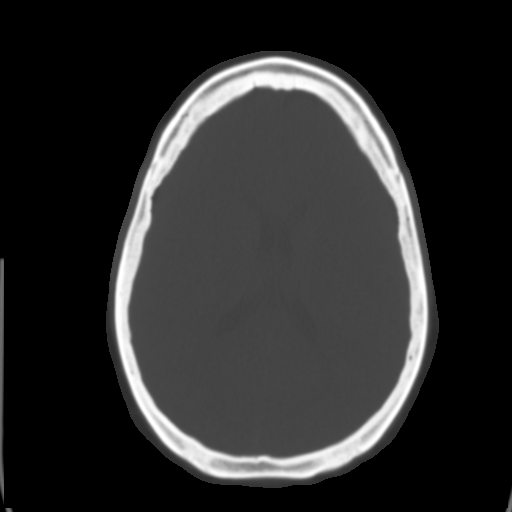
[im 18/30  brain]
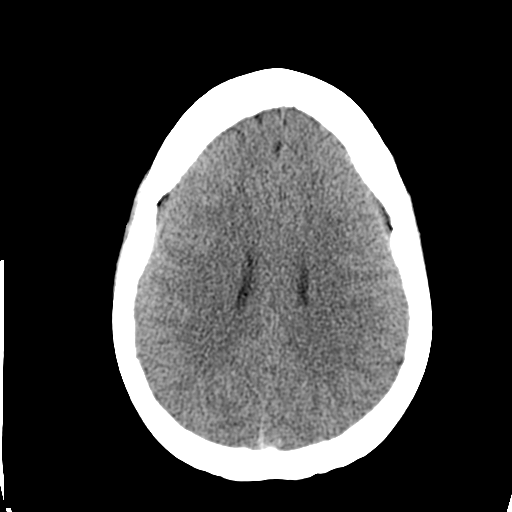
[im 20/30  brain]
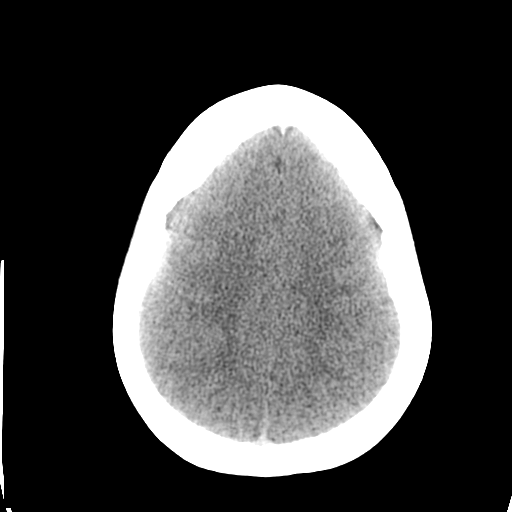
[im 22/30  brain]
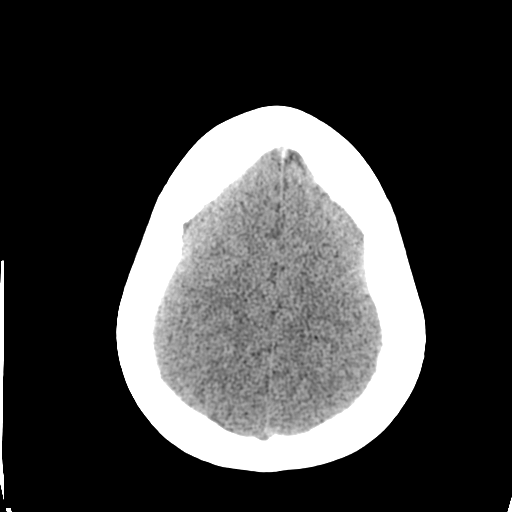
[im 23/30  brain]
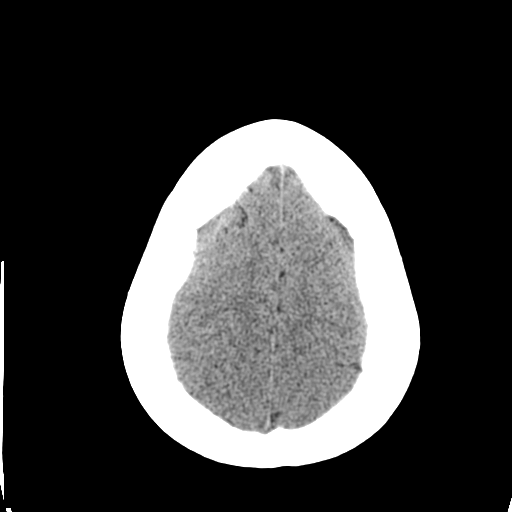
[im 23/30  bone]
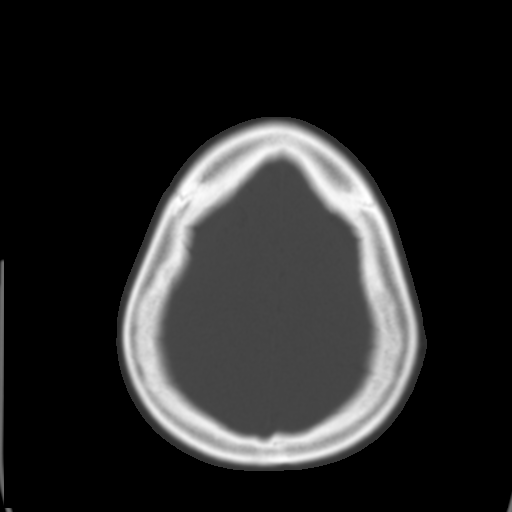
[im 25/30  brain]
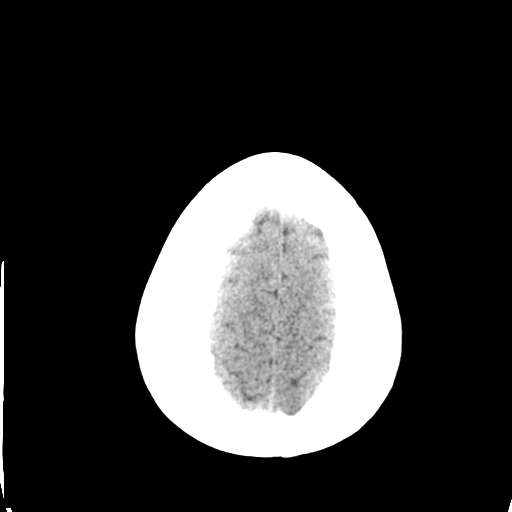
[im 27/30  brain]
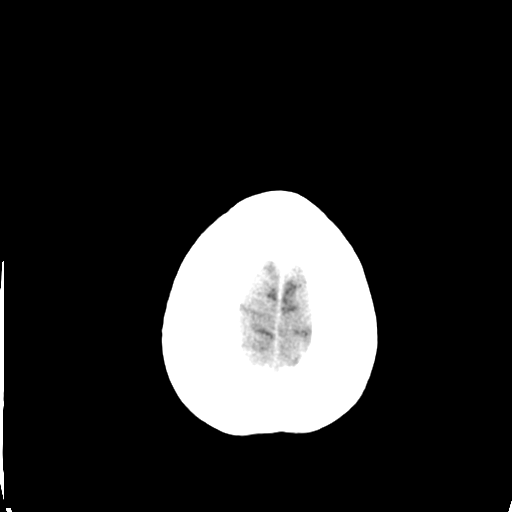
[im 29/30  brain]
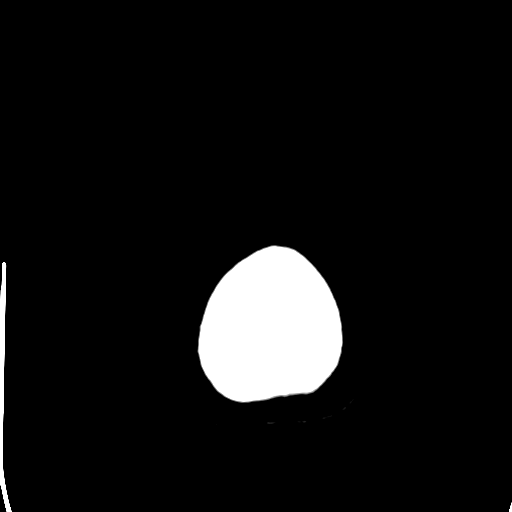

[16 of 30 positions shown; findings below may reference images not displayed]

FINDINGS: No acute intracranial abnormalities.  Specifically, no
evidence of intracranial mass, mass effect, definite signs of acute
infarct, hydrocephalus or other abnormal intra or extra-axial fluid
collections.  No depressed skull fractures are noted.  There is
near complete opacification of the left maxillary sinus and mucosal
thickening seen scattered throughout the ethmoid sinuses
bilaterally.  In the posterior aspect of the right maxillary sinus
there is a small soft tissue attenuation structure that may
represent a small paranasal polyp or mucosal retention cyst.
IMPRESSION: 1.  No acute intracranial abnormalities.
2.  Appearance of the brain is normal, as above.
3.  Paranasal sinus disease, as above, including near complete
opacification of the left maxillary sinus.  This could reflect
acute sinusitis.

## 2013-10-21 ENCOUNTER — Encounter (HOSPITAL_COMMUNITY): Payer: Self-pay | Admitting: Emergency Medicine

## 2013-10-21 ENCOUNTER — Emergency Department (HOSPITAL_COMMUNITY)
Admission: EM | Admit: 2013-10-21 | Discharge: 2013-10-21 | Disposition: A | Discharge status: Nursing Home (Includes ICF) | Payer: Medicaid Other | Source: Home / Self Care | Attending: Family Medicine | Admitting: Family Medicine

## 2013-10-21 ENCOUNTER — Emergency Department (HOSPITAL_COMMUNITY)
Admission: EM | Admit: 2013-10-21 | Discharge: 2013-10-21 | Disposition: A | Discharge status: Home / Self Care | Payer: Medicaid Other | Attending: Emergency Medicine | Admitting: Emergency Medicine

## 2013-10-21 DIAGNOSIS — A5901 Trichomonal vulvovaginitis: Secondary | ICD-10-CM | POA: Insufficient documentation

## 2013-10-21 DIAGNOSIS — R630 Anorexia: Secondary | ICD-10-CM | POA: Insufficient documentation

## 2013-10-21 DIAGNOSIS — A599 Trichomoniasis, unspecified: Secondary | ICD-10-CM

## 2013-10-21 DIAGNOSIS — R1032 Left lower quadrant pain: Secondary | ICD-10-CM

## 2013-10-21 DIAGNOSIS — I10 Essential (primary) hypertension: Secondary | ICD-10-CM | POA: Insufficient documentation

## 2013-10-21 DIAGNOSIS — Z8709 Personal history of other diseases of the respiratory system: Secondary | ICD-10-CM | POA: Insufficient documentation

## 2013-10-21 DIAGNOSIS — R52 Pain, unspecified: Secondary | ICD-10-CM

## 2013-10-21 DIAGNOSIS — R102 Pelvic and perineal pain: Secondary | ICD-10-CM

## 2013-10-21 DIAGNOSIS — Z8742 Personal history of other diseases of the female genital tract: Secondary | ICD-10-CM | POA: Insufficient documentation

## 2013-10-21 LAB — BASIC METABOLIC PANEL
BUN: 16 mg/dL (ref 6–23)
CHLORIDE: 101 meq/L (ref 96–112)
CO2: 27 meq/L (ref 19–32)
CREATININE: 0.67 mg/dL (ref 0.50–1.10)
Calcium: 9.5 mg/dL (ref 8.4–10.5)
GFR calc Af Amer: >90 mL/min (ref >90)
GFR calc non Af Amer: >90 mL/min (ref >90)
Glucose, Bld: 88 mg/dL (ref 70–99)
POTASSIUM: 3.9 meq/L (ref 3.7–5.3)
Sodium: 140 mEq/L (ref 137–147)

## 2013-10-21 LAB — RPR: RPR Ser Ql: REACTIVE — AB

## 2013-10-21 LAB — WET PREP, GENITAL
CLUE CELLS WET PREP: NONE SEEN
Yeast Wet Prep HPF POC: NONE SEEN

## 2013-10-21 LAB — HEPATIC FUNCTION PANEL
ALK PHOS: 108 U/L (ref 39–117)
ALT: 16 U/L (ref 0–35)
AST: 20 U/L (ref 0–37)
Albumin: 3.5 g/dL (ref 3.5–5.2)
Total Bilirubin: 0.2 mg/dL — ABNORMAL LOW (ref 0.3–1.2)
Total Protein: 8.1 g/dL (ref 6.0–8.3)

## 2013-10-21 LAB — CBC
HEMATOCRIT: 33.7 % — AB (ref 36.0–46.0)
HEMOGLOBIN: 10.7 g/dL — AB (ref 12.0–15.0)
MCH: 24.8 pg — ABNORMAL LOW (ref 26.0–34.0)
MCHC: 31.8 g/dL (ref 30.0–36.0)
MCV: 78.2 fL (ref 78.0–100.0)
Platelets: 419 10*3/uL — ABNORMAL HIGH (ref 150–400)
RBC: 4.31 MIL/uL (ref 3.87–5.11)
RDW: 16.2 % — ABNORMAL HIGH (ref 11.5–15.5)
WBC: 6.9 10*3/uL (ref 4.0–10.5)

## 2013-10-21 LAB — POCT URINALYSIS DIP (DEVICE)
BILIRUBIN URINE: NEGATIVE
GLUCOSE, UA: NEGATIVE mg/dL
HGB URINE DIPSTICK: NEGATIVE
KETONES UR: NEGATIVE mg/dL
Leukocytes, UA: NEGATIVE
Nitrite: NEGATIVE
Protein, ur: NEGATIVE mg/dL
UROBILINOGEN UA: 0.2 mg/dL (ref 0.0–1.0)
pH: 6.5 (ref 5.0–8.0)

## 2013-10-21 LAB — POCT PREGNANCY, URINE: PREG TEST UR: NEGATIVE

## 2013-10-21 LAB — RPR TITER: RPR Titer: 1:16 {titer} — AB

## 2013-10-21 LAB — HIV ANTIBODY (ROUTINE TESTING W REFLEX): HIV: NONREACTIVE

## 2013-10-21 MED ORDER — KETOROLAC TROMETHAMINE 30 MG/ML IJ SOLN: 30.0000 mg | Freq: Once | INTRAMUSCULAR | Status: DC

## 2013-10-21 MED ORDER — AZITHROMYCIN 250 MG PO TABS
1000.0000 mg | ORAL_TABLET | Freq: Once | ORAL | Status: AC
  Administered 2013-10-21: 1000 mg via ORAL
  Filled 2013-10-21: qty 4

## 2013-10-21 MED ORDER — PROMETHAZINE HCL 25 MG PO TABS: 25.0000 mg | ORAL_TABLET | Freq: Four times a day (QID) | ORAL | Status: DC | PRN

## 2013-10-21 MED ORDER — LIDOCAINE HCL (PF) 1 % IJ SOLN
INTRAMUSCULAR | Status: AC
  Filled 2013-10-21: qty 5

## 2013-10-21 MED ORDER — DOXYCYCLINE HYCLATE 100 MG PO CAPS: 100.0000 mg | ORAL_CAPSULE | Freq: Two times a day (BID) | ORAL | Status: DC

## 2013-10-21 MED ORDER — METRONIDAZOLE 500 MG PO TABS: 500.0000 mg | ORAL_TABLET | Freq: Two times a day (BID) | ORAL | Status: DC

## 2013-10-21 MED ORDER — SODIUM CHLORIDE 0.9 % IV BOLUS (SEPSIS)
1000.0000 mL | Freq: Once | INTRAVENOUS | Status: AC
  Administered 2013-10-21: 1000 mL via INTRAVENOUS

## 2013-10-21 MED ORDER — LIDOCAINE HCL (PF) 1 % IJ SOLN
2.0000 mL | Freq: Once | INTRAMUSCULAR | Status: AC
  Administered 2013-10-21: 2 mL

## 2013-10-21 MED ORDER — IBUPROFEN 800 MG PO TABS: 800.0000 mg | ORAL_TABLET | Freq: Three times a day (TID) | ORAL | Status: DC

## 2013-10-21 MED ORDER — CEFTRIAXONE SODIUM 250 MG IJ SOLR
250.0000 mg | Freq: Once | INTRAMUSCULAR | Status: AC
  Administered 2013-10-21: 250 mg via INTRAMUSCULAR
  Filled 2013-10-21: qty 250

## 2013-10-21 MED ORDER — MORPHINE SULFATE 4 MG/ML IJ SOLN
4.0000 mg | Freq: Once | INTRAMUSCULAR | Status: AC
  Administered 2013-10-21: 4 mg via INTRAVENOUS
  Filled 2013-10-21: qty 1

## 2013-10-21 NOTE — ED Provider Notes (Signed)
CSN: [id number]     Arrival date & time 10/21/13  0102 History   First MD Initiated Contact with Patient 10/21/13 [phone]     Chief Complaint  Patient presents with  . Abdominal Pain   (Consider location/radiation/quality/duration/timing/severity/associated sxs/prior Treatment) Patient is a 28 y.o. female presenting with abdominal pain. The history is provided by the patient.  Abdominal Pain Pain location:  LLQ (worsening sx over 2 wks, hard to get oob, ros neg.) Pain quality: cramping   Pain radiates to:  RLQ Pain severity:  Moderate Onset quality:  Gradual Duration:  2 weeks Progression:  Waxing and waning Chronicity:  New Relieved by:  None tried Worsened by:  Nothing tried Ineffective treatments:  None tried Associated symptoms: no chills, no constipation, no diarrhea, no dysuria, no fever, no hematuria, no nausea, no vaginal bleeding, no vaginal discharge and no vomiting   Risk factors: obesity   Risk factors comment:  G3P2 misc 1   Past Medical History  Diagnosis Date  . Sinus headache   . Hypertension   . Trichomonas   . Chlamydia    Past Surgical History  Procedure Laterality Date  . Dilation and evacuation  07/16/2012    Procedure: DILATATION AND EVACUATION;  Surgeon: Osborne Oman, MD;  Location: [facility];  Service: Gynecology;  Laterality: N/A;   Family History  Problem Relation Age of Onset  . Cancer Father    History  Substance Use Topics  . Smoking status: Never Smoker   . Smokeless tobacco: Never Used  . Alcohol Use: No   OB History   Grav Para Term Preterm Abortions TAB SAB Ect Mult Living   3 2 2       2      Review of Systems  Constitutional: Negative.  Negative for fever and chills.  Gastrointestinal: Positive for abdominal pain. Negative for nausea, vomiting, diarrhea, constipation and blood in stool.  Genitourinary: Negative for dysuria, hematuria, vaginal bleeding, vaginal discharge and pelvic pain.    Allergies  Review of patient's  allergies indicates no known allergies.  Home Medications   Current Outpatient Rx  Name  Route  Sig  Dispense  Refill  . ibuprofen (ADVIL,MOTRIN) 200 MG tablet   Oral   Take 400 mg by mouth every 6 (six) hours as needed for headache.          BP 129/85  Pulse 72  Temp(Src) 97 F (36.1 C) (Oral)  Resp 18  SpO2 100%  LMP 10/10/2013 Physical Exam  Nursing note and vitals reviewed. Constitutional: She is oriented to person, place, and time. She appears well-developed and well-nourished.  Abdominal: Soft. Normal appearance and bowel sounds are normal. She exhibits no distension and no mass. There is tenderness in the left lower quadrant. There is no rigidity, no rebound, no guarding, no CVA tenderness, no tenderness at McBurney's point and negative Murphy's sign.    Neurological: She is alert and oriented to person, place, and time.  Skin: Skin is warm and dry.    ED Course  Procedures (including critical care time) Labs Review Labs Reviewed  POCT URINALYSIS DIP (DEVICE)  POCT PREGNANCY, URINE   Imaging Review No results found. U/a and upreg neg.  MDM   1. Abdominal pain, acute, left lower quadrant    Sent for ct eval of 2 wks of bilat lower abd pain worse today, neg ros otherwise,      Billy Fischer, MD 10/21/13 [phone]

## 2013-10-21 NOTE — ED Notes (Signed)
2 attempts made to start IV. Unsuccessful. IV RN paged.

## 2013-10-21 NOTE — ED Provider Notes (Signed)
CSN: [id number]     Arrival date & time 10/21/13  1008 History   First MD Initiated Contact with Patient 10/21/13 1010     Chief Complaint  Patient presents with  . Abdominal Pain     (Consider location/radiation/quality/duration/timing/severity/associated sxs/prior Treatment) HPI Comments: Amber Sanders is a 28 y.o. female with a past medical history of PID, HTN presenting the Emergency Department with a chief complaint of abdominal pain for 2 weeks.  The patient reports gradual onset of LLQ discomfort, described as cramping and dull.  She reports normal BM, last one yesterday.  Denies BRBPR, or pus in stool.  Denies vomiting, reports decreased appetite.  Denies history of abdominal surgeries. Denies urinary symptoms, abnormal vaginal discharge or abnormal vaginal bleeding. Patient's last menstrual period was 10/10/2013.    The history is provided by the patient and medical records. No language interpreter was used.    Past Medical History  Diagnosis Date  . Sinus headache   . Hypertension   . Trichomonas   . Chlamydia    Past Surgical History  Procedure Laterality Date  . Dilation and evacuation  07/16/2012    Procedure: DILATATION AND EVACUATION;  Surgeon: Osborne Oman, MD;  Location: [facility];  Service: Gynecology;  Laterality: N/A;   Family History  Problem Relation Age of Onset  . Cancer Father    History  Substance Use Topics  . Smoking status: Never Smoker   . Smokeless tobacco: Never Used  . Alcohol Use: No   OB History   Grav Para Term Preterm Abortions TAB SAB Ect Mult Living   3 2 2       2      Review of Systems  Constitutional: Positive for appetite change. Negative for fever and chills.  Gastrointestinal: Positive for abdominal pain. Negative for vomiting, diarrhea, constipation, blood in stool and anal bleeding.  Genitourinary: Negative for dysuria, urgency, hematuria, flank pain, vaginal bleeding, vaginal discharge and vaginal pain.   Musculoskeletal: Negative for back pain.      Allergies  Review of patient's allergies indicates no known allergies.  Home Medications   Current Outpatient Rx  Name  Route  Sig  Dispense  Refill  . ibuprofen (ADVIL,MOTRIN) 200 MG tablet   Oral   Take 400 mg by mouth every 6 (six) hours as needed for headache or mild pain.           BP 120/74  Pulse 65  Temp(Src) 97.8 F (36.6 C) (Oral)  Resp 18  SpO2 100%  LMP 10/10/2013 Physical Exam  Nursing note and vitals reviewed. Constitutional: She appears well-developed and well-nourished. No distress.  HENT:  Head: Normocephalic.  Neck: Neck supple.  Cardiovascular: Normal rate and regular rhythm.   Pulmonary/Chest: Breath sounds normal. She has no wheezes. She has no rales.  Abdominal: Soft. Bowel sounds are normal. She exhibits no distension. There is tenderness in the right lower quadrant, suprapubic area and left lower quadrant. There is no rebound, no guarding, no CVA tenderness, no tenderness at McBurney's point and negative Murphy's sign.  Abdominal exam limited by patient's body habitus.   Genitourinary: Cervix exhibits discharge. Cervix exhibits no motion tenderness. Right adnexum displays tenderness. Left adnexum displays tenderness.  Moderate amount of vaginal discharge in posterior vaginal vault.  Left greater than Right adnexa tenderness. No obvious mass. Chaperone present.    Musculoskeletal: Normal range of motion.  Neurological: She is alert.  Skin: Skin is warm and dry. No rash noted. She is not  diaphoretic.  Psychiatric: She has a normal mood and affect. Her behavior is normal.    ED Course  Procedures (including critical care time) Labs Review Labs Reviewed  WET PREP, GENITAL - Abnormal; Notable for the following:    Trich, Wet Prep FEW (*)    WBC, Wet Prep HPF POC FEW (*)    All other components within normal limits  CBC - Abnormal; Notable for the following:    Hemoglobin 10.7 (*)    HCT 33.7 (*)     MCH 24.8 (*)    RDW 16.2 (*)    Platelets 419 (*)    All other components within normal limits  HEPATIC FUNCTION PANEL - Abnormal; Notable for the following:    Total Bilirubin 0.2 (*)    All other components within normal limits  GC/CHLAMYDIA PROBE AMP  BASIC METABOLIC PANEL  RPR  HIV ANTIBODY (ROUTINE TESTING)   Imaging Review No results found.   EKG Interpretation None      MDM   Final diagnoses:  Trichimoniasis  Pelvic pain   Pt with a 2 week history of LLQ discomfort. Pelvic exam shows moderate amount of discharge and Left adnexa discomfort.  Wet prep with trichomoniasis,  Will treat for PID. STD pending. Discussed patient history, condition, and labs with Dr. Karle Starch, who agrees on the current work up and treatment plan.  Discussed lab results, and treatment plan with the patient. Return precautions given. Reports understanding and no other concerns at this time.  Patient is stable for discharge at this time.   Meds given in ED:  Medications  morphine 4 MG/ML injection 4 mg (4 mg Intravenous Given 10/21/13 1225)  sodium chloride 0.9 % bolus 1,000 mL (0 mLs Intravenous Stopped 10/21/13 1407)  cefTRIAXone (ROCEPHIN) injection 250 mg (250 mg Intramuscular Given 10/21/13 1518)  azithromycin (ZITHROMAX) tablet 1,000 mg (1,000 mg Oral Given 10/21/13 1518)  lidocaine (PF) (XYLOCAINE) 1 % injection 2 mL (2 mLs Other Given 10/21/13 1518)    Discharge Medication List as of 10/21/2013  2:59 PM    START taking these medications   Details  doxycycline (VIBRAMYCIN) 100 MG capsule Take 1 capsule (100 mg total) by mouth 2 (two) times daily., Starting 10/21/2013, Until Discontinued, Print    !! ibuprofen (ADVIL,MOTRIN) 800 MG tablet Take 1 tablet (800 mg total) by mouth 3 (three) times daily. Take with food, Starting 10/21/2013, Until Discontinued, Print    metroNIDAZOLE (FLAGYL) 500 MG tablet Take 1 tablet (500 mg total) by mouth 2 (two) times daily., Starting 10/21/2013, Until  Discontinued, Print    promethazine (PHENERGAN) 25 MG tablet Take 1 tablet (25 mg total) by mouth every 6 (six) hours as needed for nausea or vomiting., Starting 10/21/2013, Until Discontinued, Print     !! - Potential duplicate medications found. Please discuss with provider.          Lorrine Kin, PA-C 10/23/13 [phone]

## 2013-10-21 NOTE — ED Notes (Signed)
Pt in c/o intermittent abd pain over the last two week, pain started to left side, moves to right and is also in her back, denies n/v, no distress noted

## 2013-10-21 NOTE — ED Notes (Signed)
IV attempted without success, second RN to try

## 2013-10-21 NOTE — ED Notes (Signed)
Pt  Reports    abd  Pain  With pain radiating  To  l  Side  Of  Abdomen          X  sev  Weeks  denys  Any     Vomiting  denys  Any     Bleeding or  Any   Discharge         Reports  Symptoms  Of being  Somewhat  Weak   At  Times

## 2013-10-21 NOTE — Discharge Instructions (Signed)
You have been treated in the emergency department for an infection, possibly sexually transmitted. Results of your gonorrhea and chlamydia tests are pending and you will be notified if they are positive. It is very important to practice safe sex and use condoms when sexually active. If your results are positive you need to notify all sexual partners so they can be treated as well. The website http://www.dontspreadit.com/ can be used to send anonymous text messages or emails to alert sexual contacts. Follow up with your doctor, or OBGYN in regards to today's visit.   ° °Gonorrhea and Chlamydia °SYMPTOMS  °In females, symptoms may go unnoticed. Symptoms that are more noticeable can include:  °Belly (abdominal) pain.  °Painful intercourse.  °Watery mucous-like discharge from the vagina.  °Miscarriage.  °Discomfort when urinating.  °Inflammation of the rectum.  °Abnormal gray-green frothy vaginal discharge  °Vaginal itching and irritatio  °Itching and irritation of the area outside the vagina.   °Painful urination.  °Bleeding after sexual intercourse.  °In males, symptoms include:  °Burning with urination.  °Pain in the testicles.  °Watery mucous-like discharge from the penis.  °It can cause longstanding (chronic) pelvic pain after frequent infections.  °TREATMENT  °PID can cause women to not be able to have children (sterile) if left untreated or if half-treated.  It is important to finish ALL medications given to you.  °This is a sexually transmitted infection. So you are also at risk for other sexually transmitted diseases, including HIV (AIDS), it is recommended that you get tested. °HOME CARE INSTRUCTIONS  °Warning: This infection is contagious. Do not have sex until treatment is completed. Follow up at your caregiver's office or the clinic to which you were referred. If your diagnosis (learning what is wrong) is confirmed by culture or some other method, your recent sexual contacts need treatment. Even if they are  symptom free or have a negative culture or evaluation, they should be treated.  °PREVENTION  °Women should use sanitary pads instead of tampons for vaginal discharge.  °Wipe front to back after using the toilet and avoid douching.   °Practice safe sex, use condoms, have only one sex partner and be sure your sex partner is not having sex with others.  °Ask your caregiver to test you for chlamydia at your regular checkups or sooner if you are having symptoms.  °Ask for further information if you are pregnant.  °SEEK IMMEDIATE MEDICAL CARE IF:  °You develop an oral temperature above 102° F (38.9° C), not controlled by medications or lasting more than 2 days.  °You develop an increase in pain.  °You develop any type of abnormal discharge.  °You develop vaginal bleeding and it is not time for your period.  °You develop painful intercourse.  ° °Bacterial Vaginosis  °Bacterial vaginosis (BV) is a vaginal infection where the normal balance of bacteria in the vagina is disrupted. This is not a sexually transmitted disease and your sexual partners do NOT need to be treated. °CAUSES  °The cause of BV is not fully understood. BV develops when there is an increase or imbalance of harmful bacteria.  °Some activities or behaviors can upset the normal balance of bacteria in the vagina and put women at increased risk including:  °Having a new sex partner or multiple sex partners.  °Douching.  °Using an intrauterine device (IUD) for contraception.  °It is not clear what role sexual activity plays in the development of BV. However, women that have never had sexual intercourse are rarely   infected with BV.  °Women do not get BV from toilet seats, bedding, swimming pools or from touching objects around them.  ° °SYMPTOMS  °Grey vaginal discharge.  °A fish-like odor with discharge, especially after sexual intercourse.  °Itching or burning of the vagina and vulva.  °Burning or pain with urination.  °Some women have no signs or symptoms at  all.  ° °TREATMENT  °Sometimes BV will clear up without treatment.  °BV may be treated with antibiotics.  °BV can recur after treatment. If this happens, a second round of antibiotics will often be prescribed.  °HOME CARE INSTRUCTIONS  °Finish all medication as directed by your caregiver.  °Do not have sex until treatment is completed.  °Do NOT drink any alcoholic beverages while being treated  with Metronidazole (Flagyl). This will cause a severe reaction inducing vomiting. ° °RESOURCE GUIDE ° °Dental Problems ° °Patients with Medicaid: °[facility]                     [facility] °[address]                                           [address] °Phone:  [phone]                                                  Phone:  [phone] ° °If unable to pay or uninsured, contact:  [facility] or [facility] to become qualified for the adult dental clinic. ° °Chronic Pain Problems °Contact [facility]  [phone] °Patients need to be referred by their primary care doctor. ° °Insufficient Money for Medicine °Contact [employer]:  call "211" or [facility] 271-5999. ° °No Primary Care Doctor °Call [facility]  832-8000 °Other agencies that provide inexpensive medical care °   [facility]  [phone] °   [facility]  832-7272 °   [facility]  [phone] °   [facility]  832-4777 °   [facility]  [phone] °   [facility]  [phone] ° °Psychological Services °[facility]  [phone] °[facility]  [phone] °[facility]   [phone] (emergency services 641-4993) ° °Substance Abuse Resources °[facility]  336-882-2125 °[facility] 336-784-9470 °[facility] 336-285-9073 °[facility] 336-845-3988 °Residential & Outpatient Substance Abuse Program  800-659-3381 ° °Abuse/Neglect °[place] [facility] (336)  641-3795 °[place] [facility] Hotline 800-378-5315 (After Hours) ° °Emergency Shelter °[facility] (336) 271-5985 ° °Maternity Homes °[facility] (336) 275-9566 °[facility] (704) 372-4663 ° °MRSA Hotline #:   832-7006 ° ° ° °[place] [facility] ° °[facility] [place]     [employer]                          [facility] °[address] [place]                       [address]      [address]  °  [place]                                                [place]                            [place] °Phone:  349-3220                                   Phone:  [phone]                 Phone:  342-8140 ° °[facility] °Phone:  342-8316 ° °[place] [facility] Hotline °(336) 342-1394 °(336) 342-3537 (After Hours) ° °

## 2013-10-21 NOTE — ED Notes (Signed)
Pt undressed, in gown, on continuous pulse oximetry and blood pressure cuff 

## 2013-10-22 ENCOUNTER — Telehealth (HOSPITAL_BASED_OUTPATIENT_CLINIC_OR_DEPARTMENT_OTHER): Payer: Self-pay | Admitting: *Deleted

## 2013-10-22 LAB — T.PALLIDUM AB, IGG

## 2013-10-22 LAB — GC/CHLAMYDIA PROBE AMP
CT Probe RNA: NEGATIVE
GC PROBE AMP APTIMA: NEGATIVE

## 2013-10-22 NOTE — ED Notes (Signed)
+   RPR- Chart sent to [place] office for review.

## 2013-10-25 NOTE — ED Provider Notes (Signed)
Medical screening examination/treatment/procedure(s) were performed by non-physician practitioner and as supervising physician I was immediately available for consultation/collaboration.   EKG Interpretation None        Charles B. Karle Starch, MD 10/25/13 [phone]

## 2013-11-17 ENCOUNTER — Emergency Department (HOSPITAL_COMMUNITY): Payer: Medicaid Other

## 2013-11-17 ENCOUNTER — Encounter (HOSPITAL_COMMUNITY): Payer: Self-pay | Admitting: Emergency Medicine

## 2013-11-17 ENCOUNTER — Emergency Department (HOSPITAL_COMMUNITY)
Admission: EM | Admit: 2013-11-17 | Discharge: 2013-11-18 | Disposition: A | Discharge status: Home / Self Care | Payer: Medicaid Other | Attending: Emergency Medicine | Admitting: Emergency Medicine

## 2013-11-17 DIAGNOSIS — N760 Acute vaginitis: Secondary | ICD-10-CM | POA: Insufficient documentation

## 2013-11-17 DIAGNOSIS — B9689 Other specified bacterial agents as the cause of diseases classified elsewhere: Secondary | ICD-10-CM | POA: Insufficient documentation

## 2013-11-17 DIAGNOSIS — Z3490 Encounter for supervision of normal pregnancy, unspecified, unspecified trimester: Secondary | ICD-10-CM

## 2013-11-17 DIAGNOSIS — O169 Unspecified maternal hypertension, unspecified trimester: Secondary | ICD-10-CM | POA: Insufficient documentation

## 2013-11-17 DIAGNOSIS — A499 Bacterial infection, unspecified: Secondary | ICD-10-CM | POA: Insufficient documentation

## 2013-11-17 DIAGNOSIS — O2390 Unspecified genitourinary tract infection in pregnancy, unspecified trimester: Secondary | ICD-10-CM | POA: Insufficient documentation

## 2013-11-17 DIAGNOSIS — R109 Unspecified abdominal pain: Secondary | ICD-10-CM

## 2013-11-17 LAB — CBC WITH DIFFERENTIAL/PLATELET
BASOS ABS: 0 10*3/uL (ref 0.0–0.1)
Basophils Relative: 0 % (ref 0–1)
EOS ABS: 0.3 10*3/uL (ref 0.0–0.7)
Eosinophils Relative: 4 % (ref 0–5)
HEMATOCRIT: 33.1 % — AB (ref 36.0–46.0)
Hemoglobin: 10.6 g/dL — ABNORMAL LOW (ref 12.0–15.0)
Lymphocytes Relative: 35 % (ref 12–46)
Lymphs Abs: 3 10*3/uL (ref 0.7–4.0)
MCH: 24.9 pg — AB (ref 26.0–34.0)
MCHC: 32 g/dL (ref 30.0–36.0)
MCV: 77.7 fL — ABNORMAL LOW (ref 78.0–100.0)
MONOS PCT: 6 % (ref 3–12)
Monocytes Absolute: 0.5 10*3/uL (ref 0.1–1.0)
Neutro Abs: 4.8 10*3/uL (ref 1.7–7.7)
Neutrophils Relative %: 56 % (ref 43–77)
Platelets: 395 10*3/uL (ref 150–400)
RBC: 4.26 MIL/uL (ref 3.87–5.11)
RDW: 15.9 % — AB (ref 11.5–15.5)
WBC: 8.6 10*3/uL (ref 4.0–10.5)

## 2013-11-17 LAB — URINALYSIS, ROUTINE W REFLEX MICROSCOPIC
Bilirubin Urine: NEGATIVE
Glucose, UA: NEGATIVE mg/dL
Hgb urine dipstick: NEGATIVE
Ketones, ur: NEGATIVE mg/dL
NITRITE: NEGATIVE
PH: 6 (ref 5.0–8.0)
Protein, ur: NEGATIVE mg/dL
Specific Gravity, Urine: 1.024 (ref 1.005–1.030)
Urobilinogen, UA: 0.2 mg/dL (ref 0.0–1.0)

## 2013-11-17 LAB — COMPREHENSIVE METABOLIC PANEL
ALBUMIN: 3.7 g/dL (ref 3.5–5.2)
ALT: 17 U/L (ref 0–35)
AST: 22 U/L (ref 0–37)
Alkaline Phosphatase: 112 U/L (ref 39–117)
BUN: 10 mg/dL (ref 6–23)
CALCIUM: 9.7 mg/dL (ref 8.4–10.5)
CO2: 26 mEq/L (ref 19–32)
CREATININE: 0.81 mg/dL (ref 0.50–1.10)
Chloride: 97 mEq/L (ref 96–112)
GFR calc Af Amer: >90 mL/min (ref >90)
GFR calc non Af Amer: >90 mL/min (ref >90)
Glucose, Bld: 83 mg/dL (ref 70–99)
Potassium: 3.7 mEq/L (ref 3.7–5.3)
Sodium: 136 mEq/L — ABNORMAL LOW (ref 137–147)
Total Bilirubin: <0.2 mg/dL — ABNORMAL LOW (ref 0.3–1.2)
Total Protein: 8.3 g/dL (ref 6.0–8.3)

## 2013-11-17 LAB — WET PREP, GENITAL
TRICH WET PREP: NONE SEEN
YEAST WET PREP: NONE SEEN

## 2013-11-17 LAB — POC URINE PREG, ED: PREG TEST UR: POSITIVE — AB

## 2013-11-17 LAB — URINE MICROSCOPIC-ADD ON

## 2013-11-17 LAB — LIPASE, BLOOD: LIPASE: 36 U/L (ref 11–59)

## 2013-11-17 LAB — HCG, QUANTITATIVE, PREGNANCY: hCG, Beta Chain, Quant, S: 7744 m[IU]/mL — ABNORMAL HIGH (ref <5)

## 2013-11-17 MED ORDER — MORPHINE SULFATE 4 MG/ML IJ SOLN: 4.0000 mg | Freq: Once | INTRAMUSCULAR | Status: DC

## 2013-11-17 MED ORDER — ONDANSETRON HCL 4 MG/2ML IJ SOLN
4.0000 mg | Freq: Once | INTRAMUSCULAR | Status: AC
  Administered 2013-11-17: 4 mg via INTRAVENOUS
  Filled 2013-11-17: qty 2

## 2013-11-17 MED ORDER — SODIUM CHLORIDE 0.9 % IV BOLUS (SEPSIS)
1000.0000 mL | Freq: Once | INTRAVENOUS | Status: AC
  Administered 2013-11-17: 1000 mL via INTRAVENOUS

## 2013-11-17 MED ORDER — ACETAMINOPHEN 500 MG PO TABS
1000.0000 mg | ORAL_TABLET | Freq: Once | ORAL | Status: AC
  Administered 2013-11-17: 1000 mg via ORAL
  Filled 2013-11-17: qty 2

## 2013-11-17 NOTE — ED Notes (Signed)
Per pt started having abdominal pain yesterday-vomiting

## 2013-11-17 NOTE — ED Notes (Signed)
POCT PREG resulted POS.

## 2013-11-17 NOTE — ED Provider Notes (Signed)
TIME SEEN: 8:46 PM  CHIEF COMPLAINT: Abdominal pain, vomiting  HPI: Patient is a 28 y.o. F G3 P2 A1 who presents emergency department with intermittent episodes of diffuse abdominal pain, cramping without radiation since she had a D&E on 07/16/2012. She states she came in today because the pain started again yesterday and she had 2 episodes of vomiting today. She denies any fevers, chills, diarrhea, vaginal bleeding or discharge, dysuria or hematuria. She states her last period was March 22. She is sexually active with one partner. She has had a history of Trichomonas and Chlamydia.  ROS: See HPI Constitutional: no fever  Eyes: no drainage  ENT: no runny nose   Cardiovascular:  no chest pain  Resp: no SOB  GI: no vomiting GU: no dysuria Integumentary: no rash  Allergy: no hives  Musculoskeletal: no leg swelling  Neurological: no slurred speech ROS otherwise negative  PAST MEDICAL HISTORY/PAST SURGICAL HISTORY:  Past Medical History  Diagnosis Date  . Sinus headache   . Hypertension   . Trichomonas   . Chlamydia     MEDICATIONS:  Prior to Admission medications   Not on File    ALLERGIES:  No Known Allergies  SOCIAL HISTORY:  History  Substance Use Topics  . Smoking status: Never Smoker   . Smokeless tobacco: Never Used  . Alcohol Use: No    FAMILY HISTORY: Family History  Problem Relation Age of Onset  . Cancer Father     EXAM: BP 141/77  Pulse 73  Temp(Src) 98.1 F (36.7 C) (Oral)  Resp 16  SpO2 100%  LMP 10/12/2013 CONSTITUTIONAL: Alert and oriented and responds appropriately to questions. Well-appearing; well-nourished HEAD: Normocephalic EYES: Conjunctivae clear, PERRL ENT: normal nose; no rhinorrhea; moist mucous membranes; pharynx without lesions noted NECK: Supple, no meningismus, no LAD  CARD: RRR; S1 and S2 appreciated; no murmurs, no clicks, no rubs, no gallops RESP: Normal chest excursion without splinting or tachypnea; breath sounds clear  and equal bilaterally; no wheezes, no rhonchi, no rales,  ABD/GI: Normal bowel sounds; non-distended; soft, obese, diffusely tender to palpation without guarding or rebound, no peritoneal signs, no tenderness at McBurney's point, negative Murphy sign GU:  Normal external genitalia, no cervical motion tenderness, no bilateral adnexal tenderness without fullness, no vaginal bleeding, patient has a known of thin white vaginal discharge was slightly foul odor BACK:  The back appears normal and is non-tender to palpation, there is no CVA tenderness EXT: Normal ROM in all joints; non-tender to palpation; no edema; normal capillary refill; no cyanosis    SKIN: Normal color for age and race; warm NEURO: Moves all extremities equally PSYCH: The patient's mood and manner are appropriate. Grooming and personal hygiene are appropriate.  MEDICAL DECISION MAKING: Patient here with intermittent abdominal pain for year and a half. Her abdominal exam is relatively benign she is hemodynamically stable. We'll check abdominal labs, urine. We'll give pain and nausea medicine IV fluids and reassess.  ED PROGRESS: Patient has a positive urine pregnancy test. Her labs are unremarkable including normal LFTs, lipase and creatinine. Her beta quantitative 7744. Her pelvic exam reveals white fast-growing vaginal discharge but she has no adnexal tenderness. No cervical motion tenderness. We will obtain transvaginal ultrasound with Doppler.  12:40 AM  Patient reports feeling much better and has been able to eat a [place] seen which without vomiting. Her transvaginal ultrasound shows normal Doppler flow to her right ovary but her left ovary was not visualized. There is a gestational sac and  the sac seen in the uterus with no cardiac activity with a intrauterine pregnancy consistent with 6 weeks and 0 days. She'll need a followup appointment hospital in 48 hours to have a repeat beta Quant. Have discussed this with patient who agrees  with this plan. We'll also discharge with prescription for prenatal vitamins. Have given strict return precautions including bleeding return precautions. Patient verbalizes understanding.     Hawkins, DO 11/18/13 [phone]

## 2013-11-18 LAB — GC/CHLAMYDIA PROBE AMP
CT Probe RNA: NEGATIVE
GC PROBE AMP APTIMA: NEGATIVE

## 2013-11-18 MED ORDER — PRENATAL VITAMINS 28-0.8 MG PO TABS: 1.0000 | ORAL_TABLET | Freq: Every day | ORAL | Status: DC

## 2013-11-18 MED ORDER — ONDANSETRON HCL 4 MG PO TABS: 4.0000 mg | ORAL_TABLET | Freq: Four times a day (QID) | ORAL | Status: DC
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
## 2013-11-24 ENCOUNTER — Encounter (HOSPITAL_COMMUNITY): Payer: Self-pay

## 2013-11-24 ENCOUNTER — Inpatient Hospital Stay (HOSPITAL_COMMUNITY)
Admission: AD | Admit: 2013-11-24 | Discharge: 2013-11-24 | Disposition: A | Discharge status: Home / Self Care | Payer: Medicaid Other | Source: Ambulatory Visit | Attending: Obstetrics & Gynecology | Admitting: Obstetrics & Gynecology

## 2013-11-24 DIAGNOSIS — O9989 Other specified diseases and conditions complicating pregnancy, childbirth and the puerperium: Secondary | ICD-10-CM

## 2013-11-24 DIAGNOSIS — M545 Low back pain, unspecified: Secondary | ICD-10-CM | POA: Insufficient documentation

## 2013-11-24 DIAGNOSIS — O99891 Other specified diseases and conditions complicating pregnancy: Secondary | ICD-10-CM | POA: Insufficient documentation

## 2013-11-24 DIAGNOSIS — O26899 Other specified pregnancy related conditions, unspecified trimester: Secondary | ICD-10-CM

## 2013-11-24 DIAGNOSIS — R0602 Shortness of breath: Secondary | ICD-10-CM | POA: Insufficient documentation

## 2013-11-24 DIAGNOSIS — R109 Unspecified abdominal pain: Secondary | ICD-10-CM | POA: Insufficient documentation

## 2013-11-24 LAB — CBC
HCT: 32.1 % — ABNORMAL LOW (ref 36.0–46.0)
HEMOGLOBIN: 10.2 g/dL — AB (ref 12.0–15.0)
MCH: 24.8 pg — ABNORMAL LOW (ref 26.0–34.0)
MCHC: 31.8 g/dL (ref 30.0–36.0)
MCV: 77.9 fL — ABNORMAL LOW (ref 78.0–100.0)
PLATELETS: 324 10*3/uL (ref 150–400)
RBC: 4.12 MIL/uL (ref 3.87–5.11)
RDW: 16.1 % — ABNORMAL HIGH (ref 11.5–15.5)
WBC: 7.6 10*3/uL (ref 4.0–10.5)

## 2013-11-24 LAB — URINALYSIS, ROUTINE W REFLEX MICROSCOPIC
Bilirubin Urine: NEGATIVE
Glucose, UA: NEGATIVE mg/dL
Hgb urine dipstick: NEGATIVE
Ketones, ur: NEGATIVE mg/dL
NITRITE: NEGATIVE
Protein, ur: NEGATIVE mg/dL
SPECIFIC GRAVITY, URINE: 1.015 (ref 1.005–1.030)
UROBILINOGEN UA: 0.2 mg/dL (ref 0.0–1.0)
pH: 7 (ref 5.0–8.0)

## 2013-11-24 LAB — WET PREP, GENITAL
Clue Cells Wet Prep HPF POC: NONE SEEN
TRICH WET PREP: NONE SEEN
Yeast Wet Prep HPF POC: NONE SEEN

## 2013-11-24 LAB — URINE MICROSCOPIC-ADD ON

## 2013-11-24 NOTE — MAU Provider Note (Signed)
History     CSN: [id number]  Arrival date and time: 11/24/13 1121   First Provider Initiated Contact with Patient 11/24/13 1157      Chief Complaint  Patient presents with  . Abdominal Pain  . Shortness of Breath   HPI  Amber Sanders is a 28 year old female, [id number] at [redacted]w[redacted]d who presents with abdominal pain and SOB for 1 week. She describes the pain as sharp, constant, "all over from top to bottom", and a 10 out of 10. She has tried Tylenol for the pain with no relief. When she eats she becomes nauseous but does not vomit. Her shortness of breath began with her abdominal pain and is intermittent. She denies shortness of breath at this time.  She also complains of lower left back pain x 2 days. She denies fever, chills, urinary symptoms, changes in bowel habits, vaginal discharge. She plans to schedule her first prenatal appointment at the [facility] after figuring out this abdominal problem. She was seen at [facility] ED on 4/27 and had a full pregnancy workup including an [place] that showed an IUP with yolk sac.   OB History   Grav Para Term Preterm Abortions TAB SAB Ect Mult Living   4 2 2  1  1   2       Past Medical History  Diagnosis Date  . Sinus headache   . Hypertension   . Trichomonas   . Chlamydia     Past Surgical History  Procedure Laterality Date  . Dilation and evacuation  07/16/2012    Procedure: DILATATION AND EVACUATION;  Surgeon: Osborne Oman, MD;  Location: [place] [facility];  Service: Gynecology;  Laterality: N/A;    Family History  Problem Relation Age of Onset  . Cancer Father     History  Substance Use Topics  . Smoking status: Never Smoker   . Smokeless tobacco: Never Used  . Alcohol Use: No    Allergies: No Known Allergies  Prescriptions prior to admission  Medication Sig Dispense Refill  . ondansetron (ZOFRAN) 4 MG tablet Take 1 tablet (4 mg total) by mouth every 6 (six) hours.  12 tablet  0  . Prenatal Vit-Fe Fumarate-FA (PRENATAL VITAMINS) 28-0.8 MG TABS Take 1  tablet by mouth daily.  30 tablet  6   Results for orders placed during the hospital encounter of 11/24/13 (from the past 48 hour(s))  URINALYSIS, ROUTINE W REFLEX MICROSCOPIC     Status: Abnormal   Collection Time    11/24/13 11:32 AM      Result Value Ref Range   Color, Urine YELLOW  YELLOW   APPearance CLEAR  CLEAR   Specific Gravity, Urine 1.015  1.005 - 1.030   pH 7.0  5.0 - 8.0   Glucose, UA NEGATIVE  NEGATIVE mg/dL   Hgb urine dipstick NEGATIVE  NEGATIVE   Bilirubin Urine NEGATIVE  NEGATIVE   Ketones, ur NEGATIVE  NEGATIVE mg/dL   Protein, ur NEGATIVE  NEGATIVE mg/dL   Urobilinogen, UA 0.2  0.0 - 1.0 mg/dL   Nitrite NEGATIVE  NEGATIVE   Leukocytes, UA TRACE (*) NEGATIVE  URINE MICROSCOPIC-ADD ON     Status: Abnormal   Collection Time    11/24/13 11:32 AM      Result Value Ref Range   Squamous Epithelial / LPF FEW (*) RARE   WBC, UA 0-2  <3 WBC/hpf   RBC / HPF 0-2  <3 RBC/hpf  WET PREP, GENITAL     Status: Abnormal  Collection Time    11/24/13 12:43 PM      Result Value Ref Range   Yeast Wet Prep HPF POC NONE SEEN  NONE SEEN   Trich, Wet Prep NONE SEEN  NONE SEEN   Clue Cells Wet Prep HPF POC NONE SEEN  NONE SEEN   WBC, Wet Prep HPF POC FEW (*) NONE SEEN   Comment: MODERATE BACTERIA SEEN  CBC     Status: Abnormal   Collection Time    11/24/13 12:55 PM      Result Value Ref Range   WBC 7.6  4.0 - 10.5 K/uL   RBC 4.12  3.87 - 5.11 MIL/uL   Hemoglobin 10.2 (*) 12.0 - 15.0 g/dL   HCT 32.1 (*) 36.0 - 46.0 %   MCV 77.9 (*) 78.0 - 100.0 fL   MCH 24.8 (*) 26.0 - 34.0 pg   MCHC 31.8  30.0 - 36.0 g/dL   RDW 16.1 (*) 11.5 - 15.5 %   Platelets 324  150 - 400 K/uL    Review of Systems  Constitutional: Negative for fever and chills.  Gastrointestinal: Positive for nausea and abdominal pain. Negative for vomiting, diarrhea, constipation and blood in stool.  Genitourinary: Negative for dysuria, urgency, frequency and hematuria.       No vaginal discharge. No vaginal  bleeding. No dysuria.    Physical Exam   Blood pressure 122/81, pulse 77, temperature 98.6 F (37 C), temperature source Oral, resp. rate 18, height 5' 3.5" (1.613 m), weight 127.914 kg (282 lb), last menstrual period 10/12/2013, SpO2 99.00%, unknown if currently breastfeeding.  Physical Exam  Constitutional: She is oriented to person, place, and time. She appears well-developed and well-nourished. No distress.  Eyes: Pupils are equal, round, and reactive to light.  Neck: Neck supple.  Respiratory: Effort normal.  GI: Soft. Normal appearance. There is generalized tenderness. There is no rigidity.  Musculoskeletal: Normal range of motion.  Neurological: She is alert and oriented to person, place, and time.  Skin: Skin is warm. She is not diaphoretic.  Psychiatric: Her behavior is normal.    MAU Course  Procedures None  MDM CBC UA   Assessment and Plan   A:  1. Abdominal pain in pregnancy   2.   IUP with yolk sac seen on [place]   P:  Discharge home in stable condition Start prenatal care ASAP Pt [facility] Pregnancy verification letter given.   Darrelyn Hillock Sunya Humbarger 11/24/2013, 11:57 AM

## 2013-11-24 NOTE — Discharge Instructions (Signed)
Abdominal Pain During Pregnancy °Abdominal pain is common in pregnancy. Most of the time, it does not cause harm. There are many causes of abdominal pain. Some causes are more serious than others. Some of the causes of abdominal pain in pregnancy are easily diagnosed. Occasionally, the diagnosis takes time to understand. Other times, the cause is not determined. Abdominal pain can be a sign that something is very wrong with the pregnancy, or the pain may have nothing to do with the pregnancy at all. For this reason, always tell your health care provider if you have any abdominal discomfort. °HOME CARE INSTRUCTIONS  °Monitor your abdominal pain for any changes. The following actions may help to alleviate any discomfort you are experiencing: °· Do not have sexual intercourse or put anything in your vagina until your symptoms go away completely. °· Get plenty of rest until your pain improves. °· Drink clear fluids if you feel nauseous. Avoid solid food as long as you are uncomfortable or nauseous. °· Only take over-the-counter or prescription medicine as directed by your health care provider. °· Keep all follow-up appointments with your health care provider. °SEEK IMMEDIATE MEDICAL CARE IF: °· You are bleeding, leaking fluid, or passing tissue from the vagina. °· You have increasing pain or cramping. °· You have persistent vomiting. °· You have painful or bloody urination. °· You have a fever. °· You notice a decrease in your baby's movements. °· You have extreme weakness or feel faint. °· You have shortness of breath, with or without abdominal pain. °· You develop a severe headache with abdominal pain. °· You have abnormal vaginal discharge with abdominal pain. °· You have persistent diarrhea. °· You have abdominal pain that continues even after rest, or gets worse. °MAKE SURE YOU:  °· Understand these instructions. °· Will watch your condition. °· Will get help right away if you are not doing well or get  worse. °Document Released: 07/10/2005 Document Revised: 04/30/2013 Document Reviewed: 02/06/2013 °ExitCare® Patient Information ©2014 [employer]. ° °

## 2013-11-24 NOTE — MAU Note (Signed)
Patient states she has been having abdominal pain for a while all over the abdomen. States it is getting worse and is now constant and when she has the pain she becomes short of breath. Denies bleeding, discharge, nausea or vomiting.

## 2013-11-24 NOTE — MAU Provider Note (Signed)
Attestation of Attending Supervision of Advanced Practitioner (PA/CNM/NP): Evaluation and management procedures were performed by the Advanced Practitioner under my supervision and collaboration.  I have reviewed the Advanced Practitioner's note and chart, and I agree with the management and plan.  Maikol Grassia, MD, FACOG Attending Obstetrician & Gynecologist Faculty Practice, [facility]  

## 2013-11-25 ENCOUNTER — Inpatient Hospital Stay (HOSPITAL_COMMUNITY)
Admission: AD | Admit: 2013-11-25 | Discharge: 2013-11-25 | Disposition: A | Discharge status: Home / Self Care | Payer: Medicaid Other | Source: Ambulatory Visit | Attending: Obstetrics & Gynecology | Admitting: Obstetrics & Gynecology

## 2013-11-25 ENCOUNTER — Inpatient Hospital Stay (HOSPITAL_COMMUNITY): Payer: Medicaid Other

## 2013-11-25 ENCOUNTER — Encounter (HOSPITAL_COMMUNITY): Payer: Self-pay | Admitting: General Practice

## 2013-11-25 DIAGNOSIS — O26899 Other specified pregnancy related conditions, unspecified trimester: Secondary | ICD-10-CM

## 2013-11-25 DIAGNOSIS — O9989 Other specified diseases and conditions complicating pregnancy, childbirth and the puerperium: Principal | ICD-10-CM

## 2013-11-25 DIAGNOSIS — R109 Unspecified abdominal pain: Secondary | ICD-10-CM | POA: Insufficient documentation

## 2013-11-25 DIAGNOSIS — O99891 Other specified diseases and conditions complicating pregnancy: Secondary | ICD-10-CM | POA: Insufficient documentation

## 2013-11-25 LAB — CBC
HCT: 31.4 % — ABNORMAL LOW (ref 36.0–46.0)
Hemoglobin: 10.1 g/dL — ABNORMAL LOW (ref 12.0–15.0)
MCH: 25.1 pg — ABNORMAL LOW (ref 26.0–34.0)
MCHC: 32.2 g/dL (ref 30.0–36.0)
MCV: 78.1 fL (ref 78.0–100.0)
PLATELETS: 317 10*3/uL (ref 150–400)
RBC: 4.02 MIL/uL (ref 3.87–5.11)
RDW: 16 % — AB (ref 11.5–15.5)
WBC: 8.4 10*3/uL (ref 4.0–10.5)

## 2013-11-25 LAB — URINALYSIS, ROUTINE W REFLEX MICROSCOPIC
BILIRUBIN URINE: NEGATIVE
Glucose, UA: NEGATIVE mg/dL
Hgb urine dipstick: NEGATIVE
KETONES UR: NEGATIVE mg/dL
Leukocytes, UA: NEGATIVE
Nitrite: NEGATIVE
PROTEIN: NEGATIVE mg/dL
SPECIFIC GRAVITY, URINE: 1.015 (ref 1.005–1.030)
UROBILINOGEN UA: 0.2 mg/dL (ref 0.0–1.0)
pH: 7.5 (ref 5.0–8.0)

## 2013-11-25 LAB — HCG, QUANTITATIVE, PREGNANCY: hCG, Beta Chain, Quant, S: 23139 m[IU]/mL — ABNORMAL HIGH (ref <5)

## 2013-11-25 MED ORDER — ACETAMINOPHEN 500 MG PO TABS
1000.0000 mg | ORAL_TABLET | Freq: Once | ORAL | Status: AC
  Administered 2013-11-25: 1000 mg via ORAL
  Filled 2013-11-25: qty 2

## 2013-11-25 NOTE — MAU Note (Signed)
Having pains in bottom of stomach, contraction like pains that are getting worse.

## 2013-11-25 NOTE — MAU Provider Note (Signed)
History     CSN: [id number]  Arrival date and time: 11/25/13 1816   None     Chief Complaint  Patient presents with  . Abdominal Pain   HPI  Pt is [id number] @[redacted]w[redacted]d  pregnant and presents with lower abd pain at 3:30pm today like contractions.  The pain comes and  Goes but when it returns it is more intense. The pain is located diffusely lower abdomen  Pt denies nausea or vomiting. Spotting or bleeding.  Pt denies constipation and had last normal bowel movement this Morning.  Pt last ate at 2:30pm- ate a hamburger and fries.  Pt denies pain with urination.  Pt denies fever or chills.  Pt also has lower back pain. Pt has not taken anything for the pain. Pt denies headache.     RN note:  Registered Nurse Signed MAU Note Service date: 11/25/2013 6:45 PM   Having pains in bottom of stomach, contraction like pains that are getting worse.     Past Medical History  Diagnosis Date  . Sinus headache   . Hypertension   . Trichomonas   . Chlamydia     Past Surgical History  Procedure Laterality Date  . Dilation and evacuation  07/16/2012    Procedure: DILATATION AND EVACUATION;  Surgeon: Osborne Oman, MD;  Location: [facility];  Service: Gynecology;  Laterality: N/A;    Family History  Problem Relation Age of Onset  . Cancer Father     History  Substance Use Topics  . Smoking status: Never Smoker   . Smokeless tobacco: Never Used  . Alcohol Use: No    Allergies: No Known Allergies  Prescriptions prior to admission  Medication Sig Dispense Refill  . acetaminophen (TYLENOL) 325 MG tablet Take 650 mg by mouth every 6 (six) hours as needed for mild pain.      . Prenatal Vit-Fe Fumarate-FA (PRENATAL VITAMINS) 28-0.8 MG TABS Take 1 tablet by mouth daily.  30 tablet  6    Review of Systems  Constitutional: Positive for chills. Negative for fever.  Gastrointestinal: Positive for abdominal pain. Negative for nausea, vomiting, diarrhea and constipation.  Genitourinary:  Negative for dysuria.   Physical Exam   Blood pressure 122/71, pulse 84, temperature 98.7 F (37.1 C), temperature source Oral, resp. rate 20, last menstrual period 10/12/2013, unknown if currently breastfeeding.  Physical Exam  Nursing note and vitals reviewed. Constitutional: She appears well-developed and well-nourished. No distress.  HENT:  Head: Normocephalic.  Eyes: Pupils are equal, round, and reactive to light.  Neck: Normal range of motion. Neck supple.  Cardiovascular: Normal rate.   Respiratory: Effort normal.  GI: Soft.  Genitourinary:  Exam performed yesterday WNL  Musculoskeletal: Normal range of motion.  Neurological: She is alert.  Skin: Skin is warm and dry.  Psychiatric: She has a normal mood and affect.    MAU Course  Procedures Results for orders placed during the hospital encounter of 11/25/13 (from the past 24 hour(s))  HCG, QUANTITATIVE, PREGNANCY     Status: Abnormal   Collection Time    11/25/13  8:20 PM      Result Value Ref Range   hCG, Beta Chain, Laqueta Carina 23[address] (*) <5 mIU/mL  CBC     Status: Abnormal   Collection Time    11/25/13  8:20 PM      Result Value Ref Range   WBC 8.4  4.0 - 10.5 K/uL   RBC 4.02  3.87 - 5.11 MIL/uL   Hemoglobin  10.1 (*) 12.0 - 15.0 g/dL   HCT 31.4 (*) 36.0 - 46.0 %   MCV 78.1  78.0 - 100.0 fL   MCH 25.1 (*) 26.0 - 34.0 pg   MCHC 32.2  30.0 - 36.0 g/dL   RDW 16.0 (*) 11.5 - 15.5 %   Platelets 317  150 - 400 K/uL  URINALYSIS, ROUTINE W REFLEX MICROSCOPIC     Status: Abnormal   Collection Time    11/25/13  8:25 PM      Result Value Ref Range   Color, Urine YELLOW  YELLOW   APPearance HAZY (*) CLEAR   Specific Gravity, Urine 1.015  1.005 - 1.030   pH 7.5  5.0 - 8.0   Glucose, UA NEGATIVE  NEGATIVE mg/dL   Hgb urine dipstick NEGATIVE  NEGATIVE   Bilirubin Urine NEGATIVE  NEGATIVE   Ketones, ur NEGATIVE  NEGATIVE mg/dL   Protein, ur NEGATIVE  NEGATIVE mg/dL   Urobilinogen, UA 0.2  0.0 - 1.0 mg/dL   Nitrite  NEGATIVE  NEGATIVE   Leukocytes, UA NEGATIVE  NEGATIVE  preliminary [place] results- SLIUP; normal ovaries  US Ob Transvaginal  11/25/2013   CLINICAL DATA:  Pelvic pain.  Early pregnancy.  EXAM: TRANSVAGINAL OB ULTRASOUND  TECHNIQUE: Transvaginal ultrasound was performed for complete evaluation of the gestation as well as the maternal uterus, adnexal regions, and pelvic cul-de-sac.  COMPARISON:  11/17/2013  FINDINGS: Intrauterine gestational sac: Single  Yolk sac:  Yes  Embryo:  Yes  Cardiac Activity: Yes  Heart Rate: 122 bpm  CRL:   7.5  mm   6 w 5 d                  [place] EDC: 07/16/2014  Maternal uterus/adnexae: Small subchorionic hemorrhage. Normal appearing ovaries. Trace amount of free fluid in the pelvis.  IMPRESSION: Intrauterine pregnancy of approximately 6 weeks 5 days gestational. New small subchorionic hemorrhage.   Electronically Signed   By: Rozetta Nunnery M.D.   On: 11/25/2013 21:37   Assessment and Plan  Abdominal pain in pregnancy Normal ultrasound and lab results F/u for prenatal care  [place] Pugh 11/25/2013, 7:02 PM

## 2013-11-25 NOTE — MAU Provider Note (Signed)
Attestation of Attending Supervision of Advanced Practitioner (CNM/NP): Evaluation and management procedures were performed by the Advanced Practitioner under my supervision and collaboration.  I have reviewed the Advanced Practitioner's note and chart, and I agree with the management and plan.  Nijel Flink Harraway-Smith 11:13 PM

## 2013-12-10 ENCOUNTER — Inpatient Hospital Stay (HOSPITAL_COMMUNITY)
Admission: AD | Admit: 2013-12-10 | Discharge: 2013-12-10 | Disposition: A | Discharge status: Home / Self Care | Payer: Medicaid Other | Source: Ambulatory Visit | Attending: Obstetrics and Gynecology | Admitting: Obstetrics and Gynecology

## 2013-12-10 ENCOUNTER — Encounter (HOSPITAL_COMMUNITY): Payer: Self-pay | Admitting: Obstetrics and Gynecology

## 2013-12-10 DIAGNOSIS — O99891 Other specified diseases and conditions complicating pregnancy: Secondary | ICD-10-CM | POA: Insufficient documentation

## 2013-12-10 DIAGNOSIS — R109 Unspecified abdominal pain: Secondary | ICD-10-CM

## 2013-12-10 DIAGNOSIS — O9989 Other specified diseases and conditions complicating pregnancy, childbirth and the puerperium: Principal | ICD-10-CM

## 2013-12-10 DIAGNOSIS — M549 Dorsalgia, unspecified: Secondary | ICD-10-CM | POA: Insufficient documentation

## 2013-12-10 DIAGNOSIS — O26899 Other specified pregnancy related conditions, unspecified trimester: Secondary | ICD-10-CM

## 2013-12-10 LAB — URINALYSIS, ROUTINE W REFLEX MICROSCOPIC
BILIRUBIN URINE: NEGATIVE
GLUCOSE, UA: NEGATIVE mg/dL
HGB URINE DIPSTICK: NEGATIVE
Ketones, ur: NEGATIVE mg/dL
Nitrite: NEGATIVE
PROTEIN: NEGATIVE mg/dL
Specific Gravity, Urine: 1.025 (ref 1.005–1.030)
Urobilinogen, UA: 0.2 mg/dL (ref 0.0–1.0)
pH: 6.5 (ref 5.0–8.0)

## 2013-12-10 LAB — URINE MICROSCOPIC-ADD ON

## 2013-12-10 MED ORDER — CYCLOBENZAPRINE HCL 5 MG PO TABS
5.0000 mg | ORAL_TABLET | Freq: Once | ORAL | Status: AC
  Administered 2013-12-10: 5 mg via ORAL
  Filled 2013-12-10: qty 1

## 2013-12-10 MED ORDER — CYCLOBENZAPRINE HCL 5 MG PO TABS: 5.0000 mg | ORAL_TABLET | Freq: Three times a day (TID) | ORAL | Status: DC | PRN

## 2013-12-10 NOTE — MAU Provider Note (Signed)
Chief Complaint: Abdominal Pain and Back Pain     SUBJECTIVE HPI: Amber Sanders is a 28 y.o. [id number] at [redacted]w[redacted]d by LMP who presents with one-day history of constant lower abdominal and groin crampy pain that radiates to her low back. She does not relate it to moving, changing positions or activity. No trauma. No dysuria, hematuria, urgency or frequency of urination. Has had prior visits for lower abdominal pain this pregnancy. Borderline anemia 2 weeks ago with hemoglobin 10.1. GC, Chlamydia, wet prep negative. Denies nausea, vomiting, constipation, diarrhea.  Past Medical History  Diagnosis Date  . Sinus headache   . Hypertension   . Trichomonas   . Chlamydia    OB History  Gravida Para Term Preterm AB SAB TAB Ectopic Multiple Living  4 2 2  1 1    2     # Outcome Date GA Lbr Len/2nd Weight Sex Delivery Anes PTL Lv  4 CUR           3 TRM         Y  2 SAB           1 TRM         Y     Past Surgical History  Procedure Laterality Date  . Dilation and evacuation  07/16/2012    Procedure: DILATATION AND EVACUATION;  Surgeon: Osborne Oman, MD;  Location: [facility];  Service: Gynecology;  Laterality: N/A;   History   Social History  . Marital Status: Single    Spouse Name: N/A    Number of Children: N/A  . Years of Education: N/A   Occupational History  . Not on file.   Social History Main Topics  . Smoking status: Never Smoker   . Smokeless tobacco: Never Used  . Alcohol Use: No  . Drug Use: No  . Sexual Activity: Not Currently    Birth Control/ Protection: None   Other Topics Concern  . Not on file   Social History Narrative  . No narrative on file   No current facility-administered medications on file prior to encounter.   Current Outpatient Prescriptions on File Prior to Encounter  Medication Sig Dispense Refill  . acetaminophen (TYLENOL) 325 MG tablet Take 650 mg by mouth every 6 (six) hours as needed for mild pain.      . Prenatal Vit-Fe Fumarate-FA  (PRENATAL VITAMINS) 28-0.8 MG TABS Take 1 tablet by mouth daily.  30 tablet  6   No Known Allergies  ROS: Pertinent items in HPI  OBJECTIVE Blood pressure 123/65, pulse 81, temperature 98.1 F (36.7 C), temperature source Oral, resp. rate 20, height 5' 3.5" (1.613 m), weight 127.461 kg (281 lb), last menstrual period 10/12/2013, SpO2 98.00%, unknown if currently breastfeeding. GENERAL: Well-developed, well-nourished female in no acute distress.  HEENT: Normocephalic HEART: normal rate RESP: normal effort ABDOMEN: Soft, minimally tender suprapubic area EXTREMITIES: Nontender, no edema NEURO: Alert and oriented SPECULUM EXAM: NEFG, physiologic discharge, no blood noted, cervix clean BIMANUAL: cervix L/C,uterus sl tender, no adnexal tenderness or masses  LAB RESULTS Results for orders placed during the hospital encounter of 12/10/13 (from the past 24 hour(s))  URINALYSIS, ROUTINE W REFLEX MICROSCOPIC     Status: Abnormal   Collection Time    12/10/13  7:20 PM      Result Value Ref Range   Color, Urine YELLOW  YELLOW   APPearance HAZY (*) CLEAR   Specific Gravity, Urine 1.025  1.005 - 1.030   pH 6.5  5.0 - 8.0   Glucose, UA NEGATIVE  NEGATIVE mg/dL   Hgb urine dipstick NEGATIVE  NEGATIVE   Bilirubin Urine NEGATIVE  NEGATIVE   Ketones, ur NEGATIVE  NEGATIVE mg/dL   Protein, ur NEGATIVE  NEGATIVE mg/dL   Urobilinogen, UA 0.2  0.0 - 1.0 mg/dL   Nitrite NEGATIVE  NEGATIVE   Leukocytes, UA TRACE (*) NEGATIVE  URINE MICROSCOPIC-ADD ON     Status: Abnormal   Collection Time    12/10/13  7:20 PM      Result Value Ref Range   Squamous Epithelial / LPF MANY (*) RARE   WBC, UA 0-2  <3 WBC/hpf   RBC / HPF 0-2  <3 RBC/hpf   Bacteria, UA RARE  RARE     IMAGING US Ob Comp Less 14 Wks  11/17/2013   CLINICAL DATA:  Two-day history of pelvic pain  EXAM: TRANSABDOMINAL AND TRANSVAGINAL ULTRASOUND OF PELVIS  DOPPLER ULTRASOUND OF OVARIES  TECHNIQUE: Both transabdominal and transvaginal  ultrasound examinations of the pelvis were performed. Transabdominal technique was performed for global imaging of the pelvis including uterus, ovaries, adnexal regions, and pelvic cul-de-sac.  It was necessary to proceed with endovaginal exam following the transabdominal exam to visualize the ovaries. Color and duplex Doppler ultrasound was utilized to evaluate blood flow to the ovaries.  COMPARISON:  None.  FINDINGS: Uterus  There is an intrauterine gestational sac and yolk sac demonstrated. No fetal pole or cardiac activity is demonstrated. The mean sac diameter is 1.2 cm corresponding to a 6 week 0 day gestation.  Right ovary  Measurements: 3.1 x 1.8 x 2.2 cm. Normal appearance/no adnexal mass. Vascularity is within the limits of normal.  Left ovary  The left ovary could not be demonstrated.  Other findings  There is a 7 mm nabothian cyst.  .  IMPRESSION: 1. There is an intrauterine gestational sac without evidence of a fetal pole. The gestational sac size 6 suggests a 6 week 0 day gestation. Serial follow-up ultrasound examinations and beta HCG determinations will be needed. 2. The left ovary could not be demonstrated. 3. The right ovary is normal in size and echotexture and demonstrates normal vascularity.   Electronically Signed   By: David  Martinique   On: 11/17/2013 23:55   US Ob Transvaginal  11/25/2013   CLINICAL DATA:  Pelvic pain.  Early pregnancy.  EXAM: TRANSVAGINAL OB ULTRASOUND  TECHNIQUE: Transvaginal ultrasound was performed for complete evaluation of the gestation as well as the maternal uterus, adnexal regions, and pelvic cul-de-sac.  COMPARISON:  11/17/2013  FINDINGS: Intrauterine gestational sac: Single  Yolk sac:  Yes  Embryo:  Yes  Cardiac Activity: Yes  Heart Rate: 122 bpm  CRL:   7.5  mm   6 w 5 d                  [place] EDC: 07/16/2014  Maternal uterus/adnexae: Small subchorionic hemorrhage. Normal appearing ovaries. Trace amount of free fluid in the pelvis.  IMPRESSION: Intrauterine pregnancy  of approximately 6 weeks 5 days gestational. New small subchorionic hemorrhage.   Electronically Signed   By: Rozetta Nunnery M.D.   On: 11/25/2013 21:37   Brief BS [place] by me: normal FHR   MAU COURSE Flexeril 5mg  po given  ASSESSMENT 1. Abdominal pain in pregnancy   Likely RLP or MSK pain G4P2012 Viable IUP [redacted]w[redacted]d  PLAN Discharge home with reassurance    Medication List         acetaminophen 325  MG tablet  Commonly known as:  TYLENOL  Take 650 mg by mouth every 6 (six) hours as needed for mild pain.     cyclobenzaprine 5 MG tablet  Commonly known as:  FLEXERIL  Take 1 tablet (5 mg total) by mouth 3 (three) times daily as needed for muscle spasms.     prenatal multivitamin Tabs tablet  Take 1 tablet by mouth daily at 12 noon.       Follow-up Information   Follow up with [facility] In 1 day. (Keep your scheduled prenatal appointment)    Contact information:   [address] [id number] [place], CNM 12/10/2013  7:26 PM

## 2013-12-10 NOTE — MAU Note (Signed)
Patient reports pain in lower abd since last pm, pain radiates to lower back on both sides. Denies bleeding. Denies dysuria.

## 2013-12-10 NOTE — Discharge Instructions (Signed)
Abdominal Pain During Pregnancy °Abdominal pain is common in pregnancy. Most of the time, it does not cause harm. There are many causes of abdominal pain. Some causes are more serious than others. Some of the causes of abdominal pain in pregnancy are easily diagnosed. Occasionally, the diagnosis takes time to understand. Other times, the cause is not determined. Abdominal pain can be a sign that something is very wrong with the pregnancy, or the pain may have nothing to do with the pregnancy at all. For this reason, always tell your health care provider if you have any abdominal discomfort. °HOME CARE INSTRUCTIONS  °Monitor your abdominal pain for any changes. The following actions may help to alleviate any discomfort you are experiencing: °· Do not have sexual intercourse or put anything in your vagina until your symptoms go away completely. °· Get plenty of rest until your pain improves. °· Drink clear fluids if you feel nauseous. Avoid solid food as long as you are uncomfortable or nauseous. °· Only take over-the-counter or prescription medicine as directed by your health care provider. °· Keep all follow-up appointments with your health care provider. °SEEK IMMEDIATE MEDICAL CARE IF: °· You are bleeding, leaking fluid, or passing tissue from the vagina. °· You have increasing pain or cramping. °· You have persistent vomiting. °· You have painful or bloody urination. °· You have a fever. °· You notice a decrease in your baby's movements. °· You have extreme weakness or feel faint. °· You have shortness of breath, with or without abdominal pain. °· You develop a severe headache with abdominal pain. °· You have abnormal vaginal discharge with abdominal pain. °· You have persistent diarrhea. °· You have abdominal pain that continues even after rest, or gets worse. °MAKE SURE YOU:  °· Understand these instructions. °· Will watch your condition. °· Will get help right away if you are not doing well or get  worse. °Document Released: 07/10/2005 Document Revised: 04/30/2013 Document Reviewed: 02/06/2013 °ExitCare® Patient Information ©2014 [employer]. ° °

## 2013-12-11 LAB — OB RESULTS CONSOLE GC/CHLAMYDIA
CHLAMYDIA, DNA PROBE: NEGATIVE
GC PROBE AMP, GENITAL: NEGATIVE

## 2014-01-05 LAB — OB RESULTS CONSOLE ABO/RH: RH TYPE: POSITIVE

## 2014-01-05 LAB — OB RESULTS CONSOLE HEPATITIS B SURFACE ANTIGEN: Hepatitis B Surface Ag: NEGATIVE

## 2014-01-05 LAB — OB RESULTS CONSOLE RUBELLA ANTIBODY, IGM: Rubella: IMMUNE

## 2014-02-26 ENCOUNTER — Inpatient Hospital Stay (HOSPITAL_COMMUNITY)
Admission: AD | Admit: 2014-02-26 | Discharge: 2014-02-27 | Disposition: A | Discharge status: Home / Self Care | Payer: Medicaid Other | Source: Ambulatory Visit | Attending: Obstetrics & Gynecology | Admitting: Obstetrics & Gynecology

## 2014-02-26 ENCOUNTER — Encounter (HOSPITAL_COMMUNITY): Payer: Self-pay | Admitting: *Deleted

## 2014-02-26 DIAGNOSIS — O9989 Other specified diseases and conditions complicating pregnancy, childbirth and the puerperium: Principal | ICD-10-CM

## 2014-02-26 DIAGNOSIS — O99891 Other specified diseases and conditions complicating pregnancy: Secondary | ICD-10-CM | POA: Insufficient documentation

## 2014-02-26 DIAGNOSIS — G43909 Migraine, unspecified, not intractable, without status migrainosus: Secondary | ICD-10-CM

## 2014-02-26 DIAGNOSIS — R109 Unspecified abdominal pain: Secondary | ICD-10-CM | POA: Insufficient documentation

## 2014-02-26 DIAGNOSIS — O10019 Pre-existing essential hypertension complicating pregnancy, unspecified trimester: Secondary | ICD-10-CM | POA: Insufficient documentation

## 2014-02-26 DIAGNOSIS — R51 Headache: Secondary | ICD-10-CM | POA: Insufficient documentation

## 2014-02-26 NOTE — MAU Note (Signed)
Pt. States she had a mild headache last night and today around 7pm took 2 Tylenol ES for it. States it is making her nauseated and dizzy. Rates her headache a 10/10 now. Pt. Also states she is having lower abdominal pain in her lower stomach that comes and goes. This started this afternoon.

## 2014-02-27 ENCOUNTER — Inpatient Hospital Stay (HOSPITAL_COMMUNITY): Payer: Medicaid Other

## 2014-02-27 DIAGNOSIS — R51 Headache: Secondary | ICD-10-CM | POA: Diagnosis not present

## 2014-02-27 DIAGNOSIS — O10019 Pre-existing essential hypertension complicating pregnancy, unspecified trimester: Secondary | ICD-10-CM | POA: Diagnosis not present

## 2014-02-27 DIAGNOSIS — R109 Unspecified abdominal pain: Secondary | ICD-10-CM | POA: Diagnosis present

## 2014-02-27 DIAGNOSIS — O99891 Other specified diseases and conditions complicating pregnancy: Secondary | ICD-10-CM | POA: Diagnosis not present

## 2014-02-27 LAB — COMPREHENSIVE METABOLIC PANEL
ALT: 21 U/L (ref 0–35)
AST: 19 U/L (ref 0–37)
Albumin: 2.4 g/dL — ABNORMAL LOW (ref 3.5–5.2)
Alkaline Phosphatase: 96 U/L (ref 39–117)
Anion gap: 11 (ref 5–15)
BUN: 8 mg/dL (ref 6–23)
CO2: 22 meq/L (ref 19–32)
Calcium: 8.5 mg/dL (ref 8.4–10.5)
Chloride: 104 mEq/L (ref 96–112)
Creatinine, Ser: 0.61 mg/dL (ref 0.50–1.10)
GFR calc Af Amer: >90 mL/min (ref >90)
Glucose, Bld: 88 mg/dL (ref 70–99)
Potassium: 3.8 mEq/L (ref 3.7–5.3)
SODIUM: 137 meq/L (ref 137–147)
Total Protein: 6.7 g/dL (ref 6.0–8.3)

## 2014-02-27 LAB — CBC WITH DIFFERENTIAL/PLATELET
BASOS ABS: 0 10*3/uL (ref 0.0–0.1)
Basophils Relative: 0 % (ref 0–1)
Eosinophils Absolute: 0.3 10*3/uL (ref 0.0–0.7)
Eosinophils Relative: 3 % (ref 0–5)
HCT: 29.4 % — ABNORMAL LOW (ref 36.0–46.0)
Hemoglobin: 9.7 g/dL — ABNORMAL LOW (ref 12.0–15.0)
LYMPHS PCT: 29 % (ref 12–46)
Lymphs Abs: 2.9 10*3/uL (ref 0.7–4.0)
MCH: 25.5 pg — ABNORMAL LOW (ref 26.0–34.0)
MCHC: 33 g/dL (ref 30.0–36.0)
MCV: 77.2 fL — ABNORMAL LOW (ref 78.0–100.0)
Monocytes Absolute: 0.6 10*3/uL (ref 0.1–1.0)
Monocytes Relative: 6 % (ref 3–12)
Neutro Abs: 6.3 10*3/uL (ref 1.7–7.7)
Neutrophils Relative %: 62 % (ref 43–77)
PLATELETS: 326 10*3/uL (ref 150–400)
RBC: 3.81 MIL/uL — AB (ref 3.87–5.11)
RDW: 15.2 % (ref 11.5–15.5)
WBC: 10.2 10*3/uL (ref 4.0–10.5)

## 2014-02-27 MED ORDER — METOCLOPRAMIDE HCL 5 MG/ML IJ SOLN
10.0000 mg | Freq: Once | INTRAMUSCULAR | Status: AC
  Administered 2014-02-27: 10 mg via INTRAVENOUS
  Filled 2014-02-27: qty 2

## 2014-02-27 MED ORDER — ACETAMINOPHEN 500 MG PO TABS
1000.0000 mg | ORAL_TABLET | Freq: Once | ORAL | Status: AC
  Administered 2014-02-27: 1000 mg via ORAL
  Filled 2014-02-27: qty 2

## 2014-02-27 NOTE — MAU Provider Note (Signed)
History     CSN: [id number]  Arrival date and time: 02/26/14 2256   None     No chief complaint on file.  HPI  28 year old G28P2012 at [redacted]w[redacted]d gestation who presents with headache and abdominal cramping. Headache began 24 hours ago. Right sided frontal and face involvement. Throbbing in character 10/10 in severity. Nausea but no photophobia. NO vision or hearing change. No trouble swallowing, weakness or tingling. No trouble speaking. Has history of headaches that are similar in character but never this severe. Took tylenol without relief 6 hours ago.  Abdominal cramping has been going on for 24 hours. Only occurs once every few hours, feels similar to menstrual cramps. No vaginal bleeding/discharge/leakage of fluid. Notes fetal movement.   Past Medical History  Diagnosis Date  . Sinus headache   . Hypertension   . Trichomonas   . Chlamydia     Past Surgical History  Procedure Laterality Date  . Dilation and evacuation  07/16/2012    Procedure: DILATATION AND EVACUATION;  Surgeon: Osborne Oman, MD;  Location: [facility];  Service: Gynecology;  Laterality: N/A;    Family History  Problem Relation Age of Onset  . Cancer Father     History  Substance Use Topics  . Smoking status: Never Smoker   . Smokeless tobacco: Never Used  . Alcohol Use: No    Allergies: No Known Allergies  Prescriptions prior to admission  Medication Sig Dispense Refill  . acetaminophen (TYLENOL) 325 MG tablet Take 650 mg by mouth every 6 (six) hours as needed for mild pain.      . Prenatal Vit-Fe Fumarate-FA (PRENATAL MULTIVITAMIN) TABS tablet Take 1 tablet by mouth daily at 12 noon.      . cyclobenzaprine (FLEXERIL) 5 MG tablet Take 1 tablet (5 mg total) by mouth 3 (three) times daily as needed for muscle spasms.  6 tablet  0    Review of Systems  Constitutional: Negative for fever and chills.  HENT: Negative for hearing loss and tinnitus.   Eyes: Negative for blurred vision, double vision and  photophobia.  Respiratory: Negative for cough and shortness of breath.   Cardiovascular: Negative for chest pain and palpitations.  Gastrointestinal: Positive for nausea and abdominal pain. Negative for heartburn, vomiting, diarrhea and constipation.  Genitourinary: Negative for dysuria and urgency.  Musculoskeletal: Negative for myalgias.  Skin: Negative for itching and rash.  Neurological: Positive for headaches. Negative for dizziness, tingling, sensory change, speech change and focal weakness.   Physical Exam   Blood pressure 138/77, pulse 77, temperature 98.2 F (36.8 C), temperature source Oral, resp. rate 20, last menstrual period 10/12/2013, SpO2 100.00%, unknown if currently breastfeeding.  Physical Exam  Constitutional: She is oriented to person, place, and time. She appears well-developed and well-nourished.  28 year old obese female laying in bed quietly, no acute distress.  HENT:  Head: Normocephalic and atraumatic.  Eyes: Conjunctivae and EOM are normal. Pupils are equal, round, and reactive to light.  Neck: Normal range of motion. Neck supple.  Cardiovascular: Normal rate and regular rhythm.   Respiratory: Effort normal and breath sounds normal.  GI: Soft. Bowel sounds are normal.  Tender to palpation RLQ, LLQ, no rebound  Musculoskeletal: She exhibits no edema and no tenderness.  Lymphadenopathy:    She has no cervical adenopathy.  Neurological: She is alert and oriented to person, place, and time. She has normal reflexes. No cranial nerve deficit. She exhibits normal muscle tone. Coordination normal.  Skin:  Skin is warm and dry.  Psychiatric: She has a normal mood and affect. Her behavior is normal.    MAU Course  Procedures  MDM Check CBC, CMP, U/A for signs of infection. Tylenol and reglan IV for headache. Pelvic exam to evaluate for any signs of labor.  Assessment and Plan  Headache now 5/10 in severity. Appears consistent with severe migraine. Patient  feels much improved after tylenol and reglan. Pelvic cramps now gone. Ultrasound showed no abnormality. Labs show no signs of infection. Instructed her to use tylenol for headache and benadryl for rest. Followup with prenatal provider if headache worsens, vision or hearing changes or pelvic cramps return.   Patient evaluated with Christin Fudge.  Toney Reil A 02/27/2014, 12:12 AM

## 2014-02-27 NOTE — Discharge Instructions (Signed)

## 2014-03-16 ENCOUNTER — Encounter (HOSPITAL_COMMUNITY): Payer: Self-pay | Admitting: Emergency Medicine

## 2014-03-16 ENCOUNTER — Emergency Department (HOSPITAL_COMMUNITY)
Admission: EM | Admit: 2014-03-16 | Discharge: 2014-03-16 | Disposition: A | Discharge status: Home / Self Care | Payer: Medicaid Other | Attending: Emergency Medicine | Admitting: Emergency Medicine

## 2014-03-16 ENCOUNTER — Emergency Department (HOSPITAL_COMMUNITY): Payer: Medicaid Other

## 2014-03-16 DIAGNOSIS — O169 Unspecified maternal hypertension, unspecified trimester: Secondary | ICD-10-CM | POA: Insufficient documentation

## 2014-03-16 DIAGNOSIS — O9989 Other specified diseases and conditions complicating pregnancy, childbirth and the puerperium: Secondary | ICD-10-CM | POA: Diagnosis present

## 2014-03-16 DIAGNOSIS — Y92410 Unspecified street and highway as the place of occurrence of the external cause: Secondary | ICD-10-CM | POA: Insufficient documentation

## 2014-03-16 DIAGNOSIS — Y9389 Activity, other specified: Secondary | ICD-10-CM | POA: Insufficient documentation

## 2014-03-16 DIAGNOSIS — S199XXA Unspecified injury of neck, initial encounter: Secondary | ICD-10-CM

## 2014-03-16 DIAGNOSIS — S0993XA Unspecified injury of face, initial encounter: Secondary | ICD-10-CM | POA: Insufficient documentation

## 2014-03-16 DIAGNOSIS — Z3492 Encounter for supervision of normal pregnancy, unspecified, second trimester: Secondary | ICD-10-CM

## 2014-03-16 DIAGNOSIS — IMO0002 Reserved for concepts with insufficient information to code with codable children: Secondary | ICD-10-CM | POA: Diagnosis not present

## 2014-03-16 DIAGNOSIS — M259 Joint disorder, unspecified: Secondary | ICD-10-CM | POA: Insufficient documentation

## 2014-03-16 DIAGNOSIS — M5430 Sciatica, unspecified side: Secondary | ICD-10-CM | POA: Insufficient documentation

## 2014-03-16 DIAGNOSIS — M899 Disorder of bone, unspecified: Secondary | ICD-10-CM | POA: Insufficient documentation

## 2014-03-16 DIAGNOSIS — Z8619 Personal history of other infectious and parasitic diseases: Secondary | ICD-10-CM | POA: Diagnosis not present

## 2014-03-16 DIAGNOSIS — M545 Low back pain: Secondary | ICD-10-CM

## 2014-03-16 MED ORDER — ONDANSETRON 4 MG PO TBDP
8.0000 mg | ORAL_TABLET | Freq: Once | ORAL | Status: AC
  Administered 2014-03-16: 8 mg via ORAL
  Filled 2014-03-16: qty 2

## 2014-03-16 MED ORDER — ONDANSETRON HCL 4 MG PO TABS: 4.0000 mg | ORAL_TABLET | Freq: Four times a day (QID) | ORAL | Status: DC

## 2014-03-16 MED ORDER — OXYCODONE-ACETAMINOPHEN 5-325 MG PO TABS: 1.0000 | ORAL_TABLET | ORAL | Status: DC | PRN

## 2014-03-16 MED ORDER — OXYCODONE-ACETAMINOPHEN 5-325 MG PO TABS
1.0000 | ORAL_TABLET | Freq: Once | ORAL | Status: AC
  Administered 2014-03-16: 1 via ORAL
  Filled 2014-03-16: qty 1
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
## 2014-03-16 NOTE — ED Notes (Signed)
Patient is alert and orientedx4.  Patient was explained discharge instructions and they understood them with no questions.  The patient's sister, Amber Sanders is taking the patient home.
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
## 2014-03-16 NOTE — Progress Notes (Signed)
Spoke to Dr Loletta Specter. Pt has been cleared obstetrically. Reviewed signs of labor, ROM, vag bleeding. To keep scheduled OB appointment.
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
## 2014-03-16 NOTE — ED Notes (Signed)
Family at bedside. 
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
## 2014-04-30 ENCOUNTER — Encounter (HOSPITAL_COMMUNITY): Payer: Self-pay | Admitting: Emergency Medicine

## 2014-04-30 ENCOUNTER — Inpatient Hospital Stay (HOSPITAL_COMMUNITY)
Admission: EM | Admit: 2014-04-30 | Discharge: 2014-04-30 | Disposition: A | Discharge status: Home / Self Care | Payer: Medicaid Other | Attending: Obstetrics and Gynecology | Admitting: Obstetrics and Gynecology

## 2014-04-30 DIAGNOSIS — D509 Iron deficiency anemia, unspecified: Secondary | ICD-10-CM

## 2014-04-30 DIAGNOSIS — R1084 Generalized abdominal pain: Secondary | ICD-10-CM

## 2014-04-30 DIAGNOSIS — O26893 Other specified pregnancy related conditions, third trimester: Secondary | ICD-10-CM

## 2014-04-30 DIAGNOSIS — Y92009 Unspecified place in unspecified non-institutional (private) residence as the place of occurrence of the external cause: Secondary | ICD-10-CM | POA: Insufficient documentation

## 2014-04-30 DIAGNOSIS — W010XXA Fall on same level from slipping, tripping and stumbling without subsequent striking against object, initial encounter: Secondary | ICD-10-CM | POA: Insufficient documentation

## 2014-04-30 DIAGNOSIS — W19XXXA Unspecified fall, initial encounter: Secondary | ICD-10-CM

## 2014-04-30 DIAGNOSIS — O9989 Other specified diseases and conditions complicating pregnancy, childbirth and the puerperium: Secondary | ICD-10-CM | POA: Diagnosis not present

## 2014-04-30 DIAGNOSIS — O99013 Anemia complicating pregnancy, third trimester: Secondary | ICD-10-CM

## 2014-04-30 DIAGNOSIS — R102 Pelvic and perineal pain: Secondary | ICD-10-CM

## 2014-04-30 DIAGNOSIS — Z3A33 33 weeks gestation of pregnancy: Secondary | ICD-10-CM

## 2014-04-30 LAB — CBC WITH DIFFERENTIAL/PLATELET
Basophils Absolute: 0 10*3/uL (ref 0.0–0.1)
Basophils Relative: 0 % (ref 0–1)
Eosinophils Absolute: 0.3 10*3/uL (ref 0.0–0.7)
Eosinophils Relative: 3 % (ref 0–5)
HCT: 29.9 % — ABNORMAL LOW (ref 36.0–46.0)
Hemoglobin: 9.4 g/dL — ABNORMAL LOW (ref 12.0–15.0)
Lymphocytes Relative: 26 % (ref 12–46)
Lymphs Abs: 2.9 10*3/uL (ref 0.7–4.0)
MCH: 23.5 pg — ABNORMAL LOW (ref 26.0–34.0)
MCHC: 31.4 g/dL (ref 30.0–36.0)
MCV: 74.8 fL — ABNORMAL LOW (ref 78.0–100.0)
Monocytes Absolute: 0.8 10*3/uL (ref 0.1–1.0)
Monocytes Relative: 7 % (ref 3–12)
Neutro Abs: 7.1 10*3/uL (ref 1.7–7.7)
Neutrophils Relative %: 64 % (ref 43–77)
Platelets: 349 10*3/uL (ref 150–400)
RBC: 4 MIL/uL (ref 3.87–5.11)
RDW: 16 % — ABNORMAL HIGH (ref 11.5–15.5)
WBC: 11.1 10*3/uL — ABNORMAL HIGH (ref 4.0–10.5)

## 2014-04-30 LAB — COMPREHENSIVE METABOLIC PANEL
ALT: 12 U/L (ref 0–35)
AST: 15 U/L (ref 0–37)
Albumin: 2.7 g/dL — ABNORMAL LOW (ref 3.5–5.2)
Alkaline Phosphatase: 113 U/L (ref 39–117)
Anion gap: 12 (ref 5–15)
BUN: 11 mg/dL (ref 6–23)
CHLORIDE: 102 meq/L (ref 96–112)
CO2: 21 meq/L (ref 19–32)
Calcium: 9.3 mg/dL (ref 8.4–10.5)
Creatinine, Ser: 0.68 mg/dL (ref 0.50–1.10)
GFR calc Af Amer: >90 mL/min (ref >90)
Glucose, Bld: 83 mg/dL (ref 70–99)
POTASSIUM: 3.8 meq/L (ref 3.7–5.3)
SODIUM: 135 meq/L — AB (ref 137–147)
Total Protein: 7.4 g/dL (ref 6.0–8.3)

## 2014-04-30 LAB — TYPE AND SCREEN
ABO/RH(D): B POS
Antibody Screen: NEGATIVE

## 2014-04-30 LAB — I-STAT CG4 LACTIC ACID, ED: LACTIC ACID, VENOUS: 0.58 mmol/L (ref 0.5–2.2)

## 2014-04-30 LAB — ABO/RH: ABO/RH(D): B POS

## 2014-04-30 MED ORDER — SODIUM CHLORIDE 0.9 % IV BOLUS (SEPSIS)
1000.0000 mL | Freq: Once | INTRAVENOUS | Status: AC
  Administered 2014-04-30: 1000 mL via INTRAVENOUS

## 2014-04-30 MED ORDER — CYCLOBENZAPRINE HCL 10 MG PO TABS
10.0000 mg | ORAL_TABLET | Freq: Once | ORAL | Status: AC
  Administered 2014-04-30: 10 mg via ORAL
  Filled 2014-04-30: qty 1

## 2014-04-30 MED ORDER — ACETAMINOPHEN 325 MG PO TABS
650.0000 mg | ORAL_TABLET | Freq: Once | ORAL | Status: AC
  Administered 2014-04-30: 650 mg via ORAL
  Filled 2014-04-30: qty 2

## 2014-04-30 MED ORDER — CYCLOBENZAPRINE HCL 5 MG PO TABS: 5.0000 mg | ORAL_TABLET | Freq: Three times a day (TID) | ORAL | Status: DC | PRN
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
## 2014-04-30 NOTE — MAU Note (Signed)
Received pt from [facility] via [facility] after being given report from [employer]. Pt fell earlier tonight and fell on her abdomen.

## 2014-04-30 NOTE — Discharge Instructions (Signed)

## 2014-04-30 NOTE — MAU Provider Note (Signed)
Chief Complaint:  Fall   First Provider Initiated Contact with Patient 04/30/14 [phone]      HPI: Amber Sanders is a 28 y.o. [id number] at [redacted]w[redacted]d who presents with IV infusing via [facility] from [facility] ED. She presented there about 0300 today after she tripped and fell forward walking up her steps returning from her work shift. She states she struck her abdomen directly, falling onto the steps and struck her left knee. Her abdomen is sore all over, particularly along the right side and worse with movement. Denies contractions, leakage of fluid or vaginal bleeding. Good fetal movement.  No [place] record available. Trauma labs were done and pt was on EFM.   Pregnancy Course: Essentially uncomplicated. Obesity.  Past Medical History: Past Medical History  Diagnosis Date  . Sinus headache   . Hypertension   . Trichomonas   . Chlamydia     Past obstetric history: OB History  Gravida Para Term Preterm AB SAB TAB Ectopic Multiple Living  4 2 2  1 1    2     # Outcome Date GA Lbr Len/2nd Weight Sex Delivery Anes PTL Lv  4 CUR           3 TRM         Y  2 SAB           1 TRM         Y      Past Surgical History: Past Surgical History  Procedure Laterality Date  . Dilation and evacuation  07/16/2012    Procedure: DILATATION AND EVACUATION;  Surgeon: Osborne Oman, MD;  Location: [facility];  Service: Gynecology;  Laterality: N/A;  . Abscess drainage      arms     Family History: Family History  Problem Relation Age of Onset  . Cancer Father     Social History: History  Substance Use Topics  . Smoking status: Never Smoker   . Smokeless tobacco: Never Used  . Alcohol Use: No    Allergies: No Known Allergies  Meds:  Prescriptions prior to admission  Medication Sig Dispense Refill  . Prenatal Vit-Fe Fumarate-FA (PRENATAL MULTIVITAMIN) TABS tablet Take 1 tablet by mouth daily at 12 noon.        ROS: Pertinent findings in history of present illness.  Physical Exam  Blood  pressure 125/76, pulse 100, temperature 97.3 F (36.3 C), temperature source Oral, resp. rate 18, height 5\' 6"  (1.676 m), weight 131.543 kg (290 lb), last menstrual period 10/12/2013, SpO2 98.00%, unknown if currently breastfeeding. GENERAL: Obese AA female in no acute distress.  Skin: No ecchymosis, abrasion or erythema over abdomen or knee HEENT: normocephalic HEART: normal rate RESP: normal effort ABDOMEN: Soft, Mild to moderate tenderness right upper and lower quadrants and right groin, gravid appropriate for gestational age EXTREMITIES: Nontender, no edema NEURO: alert and oriented, grossly intact     FHT:  Baseline 120, moderate variability, accelerations present, no decelerations Contractions: none   Labs: Results for orders placed during the hospital encounter of 04/30/14 (from the past 24 hour(s))  ABO/RH     Status: None   Collection Time    04/30/14  4:20 AM      Result Value Ref Range   ABO/RH(D) B POS     No rh immune globuloin NOT A RH IMMUNE GLOBULIN CANDIDATE, PT RH POSITIVE    CBC WITH DIFFERENTIAL     Status: Abnormal   Collection Time    04/30/14  4:35 AM      Result Value Ref Range   WBC 11.1 (*) 4.0 - 10.5 K/uL   RBC 4.00  3.87 - 5.11 MIL/uL   Hemoglobin 9.4 (*) 12.0 - 15.0 g/dL   HCT 29.9 (*) 36.0 - 46.0 %   MCV 74.8 (*) 78.0 - 100.0 fL   MCH 23.5 (*) 26.0 - 34.0 pg   MCHC 31.4  30.0 - 36.0 g/dL   RDW 16.0 (*) 11.5 - 15.5 %   Platelets 349  150 - 400 K/uL   Neutrophils Relative % 64  43 - 77 %   Neutro Abs 7.1  1.7 - 7.7 K/uL   Lymphocytes Relative 26  12 - 46 %   Lymphs Abs 2.9  0.7 - 4.0 K/uL   Monocytes Relative 7  3 - 12 %   Monocytes Absolute 0.8  0.1 - 1.0 K/uL   Eosinophils Relative 3  0 - 5 %   Eosinophils Absolute 0.3  0.0 - 0.7 K/uL   Basophils Relative 0  0 - 1 %   Basophils Absolute 0.0  0.0 - 0.1 K/uL  COMPREHENSIVE METABOLIC PANEL     Status: Abnormal   Collection Time    04/30/14  4:35 AM      Result Value Ref Range   Sodium 135  (*) 137 - 147 mEq/L   Potassium 3.8  3.7 - 5.3 mEq/L   Chloride 102  96 - 112 mEq/L   CO2 21  19 - 32 mEq/L   Glucose, Bld 83  70 - 99 mg/dL   BUN 11  6 - 23 mg/dL   Creatinine, Ser 0.68  0.50 - 1.10 mg/dL   Calcium 9.3  8.4 - 10.5 mg/dL   Total Protein 7.4  6.0 - 8.3 g/dL   Albumin 2.7 (*) 3.5 - 5.2 g/dL   AST 15  0 - 37 U/L   ALT 12  0 - 35 U/L   Alkaline Phosphatase 113  39 - 117 U/L   Total Bilirubin <0.2 (*) 0.3 - 1.2 mg/dL   GFR calc non Af Amer >90  >90 mL/min   GFR calc Af Amer >90  >90 mL/min   Anion gap 12  5 - 15  I-STAT CG4 LACTIC ACID, ED     Status: None   Collection Time    04/30/14  4:45 AM      Result Value Ref Range   Lactic Acid, Venous 0.58  0.5 - 2.2 mmol/L    Imaging:  No results found. MAU Course: Flexeril 10 mg po given EFM x 4 hour C/W Dr. Philis Pique  Assessment: 1. Fall, initial encounter   2. Generalized abdominal pain   G4P2012 at [redacted]w[redacted]d Category 1 FHR No contractions   Plan: Discharge home with reassurance Labor precautions and fetal kick counts    Medication List         cyclobenzaprine 5 MG tablet  Commonly known as:  FLEXERIL  Take 1 tablet (5 mg total) by mouth 3 (three) times daily as needed for muscle spasms.     prenatal multivitamin Tabs tablet  Take 1 tablet by mouth daily at 12 noon.       Follow-up Information   Follow up with Allyn Kenner, DO. (Keep your scheduled prenatal appointment)    Specialty:  Obstetrics and Gynecology   Contact information:   [place] [address] [phone]       Lorene Dy, CNM 04/30/2014  8:32 AM

## 2014-05-04 ENCOUNTER — Encounter (HOSPITAL_COMMUNITY): Payer: Self-pay | Admitting: *Deleted

## 2014-05-04 ENCOUNTER — Inpatient Hospital Stay (HOSPITAL_COMMUNITY)
Admission: AD | Admit: 2014-05-04 | Discharge: 2014-05-04 | Disposition: A | Discharge status: Home / Self Care | Payer: Medicaid Other | Source: Ambulatory Visit | Attending: Obstetrics and Gynecology | Admitting: Obstetrics and Gynecology

## 2014-05-04 ENCOUNTER — Inpatient Hospital Stay (HOSPITAL_COMMUNITY): Payer: Medicaid Other

## 2014-05-04 DIAGNOSIS — N858 Other specified noninflammatory disorders of uterus: Secondary | ICD-10-CM

## 2014-05-04 DIAGNOSIS — N859 Noninflammatory disorder of uterus, unspecified: Secondary | ICD-10-CM

## 2014-05-04 DIAGNOSIS — O368131 Decreased fetal movements, third trimester, fetus 1: Secondary | ICD-10-CM

## 2014-05-04 DIAGNOSIS — Z3A29 29 weeks gestation of pregnancy: Secondary | ICD-10-CM

## 2014-05-04 DIAGNOSIS — O368130 Decreased fetal movements, third trimester, not applicable or unspecified: Secondary | ICD-10-CM | POA: Diagnosis present

## 2014-05-04 LAB — FETAL FIBRONECTIN: Fetal Fibronectin: NEGATIVE

## 2014-05-04 NOTE — Discharge Instructions (Signed)
Fetal Movement Counts °Patient Name: __________________________________________________ Patient Due Date: ____________________ °Performing a fetal movement count is highly recommended in high-risk pregnancies, but it is good for every pregnant woman to do. Your health care provider may ask you to start counting fetal movements at 28 weeks of the pregnancy. Fetal movements often increase: °· After eating a full meal. °· After physical activity. °· After eating or drinking something sweet or cold. °· At rest. °Pay attention to when you feel the baby is most active. This will help you notice a pattern of your baby's sleep and wake cycles and what factors contribute to an increase in fetal movement. It is important to perform a fetal movement count at the same time each day when your baby is normally most active.  °HOW TO COUNT FETAL MOVEMENTS °1. Find a quiet and comfortable area to sit or lie down on your left side. Lying on your left side provides the best blood and oxygen circulation to your baby. °2. Write down the day and time on a sheet of paper or in a journal. °3. Start counting kicks, flutters, swishes, rolls, or jabs in a 2-hour period. You should feel at least 10 movements within 2 hours. °4. If you do not feel 10 movements in 2 hours, wait 2-3 hours and count again. Look for a change in the pattern or not enough counts in 2 hours. °SEEK MEDICAL CARE IF: °· You feel less than 10 counts in 2 hours, tried twice. °· There is no movement in over an hour. °· The pattern is changing or taking longer each day to reach 10 counts in 2 hours. °· You feel the baby is not moving as he or she usually does. °Date: ____________ Movements: ____________ Start time: ____________ Finish time: ____________  °Date: ____________ Movements: ____________ Start time: ____________ Finish time: ____________ °Date: ____________ Movements: ____________ Start time: ____________ Finish time: ____________ °Date: ____________ Movements:  ____________ Start time: ____________ Finish time: ____________ °Date: ____________ Movements: ____________ Start time: ____________ Finish time: ____________ °Date: ____________ Movements: ____________ Start time: ____________ Finish time: ____________ °Date: ____________ Movements: ____________ Start time: ____________ Finish time: ____________ °Date: ____________ Movements: ____________ Start time: ____________ Finish time: ____________  °Date: ____________ Movements: ____________ Start time: ____________ Finish time: ____________ °Date: ____________ Movements: ____________ Start time: ____________ Finish time: ____________ °Date: ____________ Movements: ____________ Start time: ____________ Finish time: ____________ °Date: ____________ Movements: ____________ Start time: ____________ Finish time: ____________ °Date: ____________ Movements: ____________ Start time: ____________ Finish time: ____________ °Date: ____________ Movements: ____________ Start time: ____________ Finish time: ____________ °Date: ____________ Movements: ____________ Start time: ____________ Finish time: ____________  °Date: ____________ Movements: ____________ Start time: ____________ Finish time: ____________ °Date: ____________ Movements: ____________ Start time: ____________ Finish time: ____________ °Date: ____________ Movements: ____________ Start time: ____________ Finish time: ____________ °Date: ____________ Movements: ____________ Start time: ____________ Finish time: ____________ °Date: ____________ Movements: ____________ Start time: ____________ Finish time: ____________ °Date: ____________ Movements: ____________ Start time: ____________ Finish time: ____________ °Date: ____________ Movements: ____________ Start time: ____________ Finish time: ____________  °Date: ____________ Movements: ____________ Start time: ____________ Finish time: ____________ °Date: ____________ Movements: ____________ Start time: ____________ Finish  time: ____________ °Date: ____________ Movements: ____________ Start time: ____________ Finish time: ____________ °Date: ____________ Movements: ____________ Start time: ____________ Finish time: ____________ °Date: ____________ Movements: ____________ Start time: ____________ Finish time: ____________ °Date: ____________ Movements: ____________ Start time: ____________ Finish time: ____________ °Date: ____________ Movements: ____________ Start time: ____________ Finish time: ____________  °Date: ____________ Movements: ____________ Start time: ____________ Finish   time: ____________ Date: ____________ Movements: ____________ Start time: ____________ Amber Sanders time: ____________ Date: ____________ Movements: ____________ Start time: ____________ Amber Sanders time: ____________ Date: ____________ Movements: ____________ Start time: ____________ Amber Sanders time: ____________ Date: ____________ Movements: ____________ Start time: ____________ Amber Sanders time: ____________ Date: ____________ Movements: ____________ Start time: ____________ Amber Sanders time: ____________ Date: ____________ Movements: ____________ Start time: ____________ Amber Sanders time: ____________  Date: ____________ Movements: ____________ Start time: ____________ Amber Sanders time: ____________ Date: ____________ Movements: ____________ Start time: ____________ Amber Sanders time: ____________ Date: ____________ Movements: ____________ Start time: ____________ Amber Sanders time: ____________ Date: ____________ Movements: ____________ Start time: ____________ Amber Sanders time: ____________ Date: ____________ Movements: ____________ Start time: ____________ Amber Sanders time: ____________ Date: ____________ Movements: ____________ Start time: ____________ Amber Sanders time: ____________ Date: ____________ Movements: ____________ Start time: ____________ Amber Sanders time: ____________  Date: ____________ Movements: ____________ Start time: ____________ Amber Sanders time: ____________ Date: ____________  Movements: ____________ Start time: ____________ Amber Sanders time: ____________ Date: ____________ Movements: ____________ Start time: ____________ Amber Sanders time: ____________ Date: ____________ Movements: ____________ Start time: ____________ Amber Sanders time: ____________ Date: ____________ Movements: ____________ Start time: ____________ Amber Sanders time: ____________ Date: ____________ Movements: ____________ Start time: ____________ Amber Sanders time: ____________ Date: ____________ Movements: ____________ Start time: ____________ Amber Sanders time: ____________  Date: ____________ Movements: ____________ Start time: ____________ Amber Sanders time: ____________ Date: ____________ Movements: ____________ Start time: ____________ Amber Sanders time: ____________ Date: ____________ Movements: ____________ Start time: ____________ Amber Sanders time: ____________ Date: ____________ Movements: ____________ Start time: ____________ Amber Sanders time: ____________ Date: ____________ Movements: ____________ Start time: ____________ Amber Sanders time: ____________ Date: ____________ Movements: ____________ Start time: ____________ Amber Sanders time: ____________ Document Released: 08/09/2006 Document Revised: 11/24/2013 Document Reviewed: 05/06/2012 [employer] Patient Information 2015 [employer]. This information is not intended to replace advice given to you by your health care provider. Make sure you discuss any questions you have with your health care provider.  Preterm Labor Information Preterm labor is when labor starts at less than 37 weeks of pregnancy. The normal length of a pregnancy is 39 to 41 weeks. CAUSES Often, there is no identifiable underlying cause as to why a woman goes into preterm labor. One of the most common known causes of preterm labor is infection. Infections of the uterus, cervix, vagina, amniotic sac, bladder, kidney, or even the lungs (pneumonia) can cause labor to start. Other suspected causes of preterm labor include:   Urogenital  infections, such as yeast infections and bacterial vaginosis.   Uterine abnormalities (uterine shape, uterine septum, fibroids, or bleeding from the placenta).   A cervix that has been operated on (it may fail to stay closed).   Malformations in the fetus.   Multiple gestations (twins, triplets, and so on).   Breakage of the amniotic sac.  RISK FACTORS  Having a previous history of preterm labor.   Having premature rupture of membranes (PROM).   Having a placenta that covers the opening of the cervix (placenta previa).   Having a placenta that separates from the uterus (placental abruption).   Having a cervix that is too weak to hold the fetus in the uterus (incompetent cervix).   Having too much fluid in the amniotic sac (polyhydramnios).   Taking illegal drugs or smoking while pregnant.   Not gaining enough weight while pregnant.   Being younger than 62 and older than 28 years old.   Having a low socioeconomic status.   Being African American. SYMPTOMS Signs and symptoms of preterm labor include:   Menstrual-like cramps, abdominal pain, or back pain.  Uterine contractions that are regular, as frequent as six in  an hour, regardless of their intensity (may be mild or painful).  Contractions that start on the top of the uterus and spread down to the lower abdomen and back.   A sense of increased pelvic pressure.   A watery or bloody mucus discharge that comes from the vagina.  TREATMENT Depending on the length of the pregnancy and other circumstances, your health care provider may suggest bed rest. If necessary, there are medicines that can be given to stop contractions and to mature the fetal lungs. If labor happens before 34 weeks of pregnancy, a prolonged hospital stay may be recommended. Treatment depends on the condition of both you and the fetus.  WHAT SHOULD YOU DO IF YOU THINK YOU ARE IN PRETERM LABOR? Call your health care provider right  away. You will need to go to the hospital to get checked immediately. HOW CAN YOU PREVENT PRETERM LABOR IN FUTURE PREGNANCIES? You should:   Stop smoking if you smoke.  Maintain healthy weight gain and avoid chemicals and drugs that are not necessary.  Be watchful for any type of infection.  Inform your health care provider if you have a known history of preterm labor. Document Released: 09/30/2003 Document Revised: 03/12/2013 Document Reviewed: 08/12/2012 [employer] Patient Information 2015 [place], [place]. This information is not intended to replace advice given to you by your health care provider. Make sure you discuss any questions you have with your health care provider.

## 2014-05-04 NOTE — MAU Provider Note (Signed)
History     CSN: [id number]  Arrival date and time: 05/04/14 1519   None     Chief Complaint  Patient presents with  . Decreased Fetal Movement   HPI This is a 28 y.o. female at [redacted]w[redacted]d who presents with c/o decreased fetal movement today.  Denies contractions but reports some lower abdominal cramping and pressure. Denies leaking or bleeding.   RN note:  Patient presents to MAU with c/o decreased fetal movement. States has felt some movement but less than normal. Denies LOF, VB, or contractions at this time.        OB History   Grav Para Term Preterm Abortions TAB SAB Ect Mult Living   4 2 2  1  1   2       Past Medical History  Diagnosis Date  . Sinus headache   . Hypertension   . Trichomonas   . Chlamydia     Past Surgical History  Procedure Laterality Date  . Dilation and evacuation  07/16/2012    Procedure: DILATATION AND EVACUATION;  Surgeon: Osborne Oman, MD;  Location: [facility];  Service: Gynecology;  Laterality: N/A;  . Abscess drainage      arms    Family History  Problem Relation Age of Onset  . Cancer Father     History  Substance Use Topics  . Smoking status: Never Smoker   . Smokeless tobacco: Never Used  . Alcohol Use: No    Allergies: No Known Allergies  Prescriptions prior to admission  Medication Sig Dispense Refill  . cyclobenzaprine (FLEXERIL) 5 MG tablet Take 1 tablet (5 mg total) by mouth 3 (three) times daily as needed for muscle spasms.  30 tablet  0  . Prenatal Vit-Fe Fumarate-FA (PRENATAL MULTIVITAMIN) TABS tablet Take 1 tablet by mouth daily at 12 noon.        Review of Systems  Constitutional: Negative for fever, chills and malaise/fatigue.  Gastrointestinal: Positive for abdominal pain (mostly pressure). Negative for nausea, vomiting, diarrhea and constipation.  Genitourinary: Negative for dysuria.  Neurological: Negative for dizziness.   Physical Exam   Blood pressure 113/66, pulse 94, temperature 97.8 F (36.6 C),  temperature source Oral, resp. rate 18, height 5\' 6"  (1.676 m), weight 290 lb (131.543 kg), last menstrual period 10/12/2013, SpO2 99.00%, unknown if currently breastfeeding.  Physical Exam  Constitutional: She is oriented to person, place, and time. She appears well-developed and well-nourished. No distress.  HENT:  Head: Normocephalic.  Cardiovascular: Normal rate.   Respiratory: Effort normal.  GI: Soft.  Genitourinary: Vagina normal and uterus normal. No vaginal discharge found.  Cervix long and closed FHR reactive Uterine irritability noted with 30-40 second contractions   Musculoskeletal: Normal range of motion.  Neurological: She is alert and oriented to person, place, and time.  Skin: Skin is warm and dry.  Psychiatric: She has a normal mood and affect.    MAU Course  Procedures  MDM Fetal fibronectin and [place] for AFI ordered  Results for orders placed during the hospital encounter of 05/04/14 (from the past 72 hour(s))  FETAL FIBRONECTIN     Status: None   Collection Time    05/04/14  4:24 PM      Result Value Ref Range   Fetal Fibronectin NEGATIVE  NEGATIVE   AFI 17.62   Assessment and Plan  A:  SIUP at [redacted]w[redacted]d       Uterine irritability       No change in cervix  Normal AFI       Reassuring FHR pattern, Category I  P;  DIscussed with Dr Rogue Bussing       Discharge home       Push PO fluids       PTL precautions       Has appt this week.         [facility] 05/04/2014, 4:13 PM

## 2014-05-04 NOTE — MAU Note (Signed)
Patient presents to MAU with c/o decreased fetal movement. States has felt some movement but less than normal. Denies LOF, VB, or contractions at this time.

## 2014-05-07 LAB — OB RESULTS CONSOLE RPR: RPR: NONREACTIVE

## 2014-05-12 ENCOUNTER — Encounter (HOSPITAL_COMMUNITY): Payer: Self-pay | Admitting: Emergency Medicine

## 2014-05-12 ENCOUNTER — Emergency Department (HOSPITAL_COMMUNITY)
Admission: EM | Admit: 2014-05-12 | Discharge: 2014-05-12 | Disposition: A | Discharge status: Home / Self Care | Payer: Medicaid Other | Attending: Emergency Medicine | Admitting: Emergency Medicine

## 2014-05-12 DIAGNOSIS — O163 Unspecified maternal hypertension, third trimester: Secondary | ICD-10-CM | POA: Insufficient documentation

## 2014-05-12 DIAGNOSIS — R51 Headache: Secondary | ICD-10-CM | POA: Insufficient documentation

## 2014-05-12 DIAGNOSIS — Z79899 Other long term (current) drug therapy: Secondary | ICD-10-CM | POA: Diagnosis not present

## 2014-05-12 DIAGNOSIS — Z3A30 30 weeks gestation of pregnancy: Secondary | ICD-10-CM | POA: Insufficient documentation

## 2014-05-12 DIAGNOSIS — H9209 Otalgia, unspecified ear: Secondary | ICD-10-CM | POA: Insufficient documentation

## 2014-05-12 DIAGNOSIS — O99013 Anemia complicating pregnancy, third trimester: Secondary | ICD-10-CM | POA: Diagnosis not present

## 2014-05-12 DIAGNOSIS — R809 Proteinuria, unspecified: Secondary | ICD-10-CM | POA: Insufficient documentation

## 2014-05-12 DIAGNOSIS — O26893 Other specified pregnancy related conditions, third trimester: Secondary | ICD-10-CM | POA: Insufficient documentation

## 2014-05-12 DIAGNOSIS — R1030 Lower abdominal pain, unspecified: Secondary | ICD-10-CM | POA: Diagnosis not present

## 2014-05-12 DIAGNOSIS — R519 Headache, unspecified: Secondary | ICD-10-CM

## 2014-05-12 DIAGNOSIS — O99613 Diseases of the digestive system complicating pregnancy, third trimester: Secondary | ICD-10-CM | POA: Diagnosis not present

## 2014-05-12 DIAGNOSIS — Z8619 Personal history of other infectious and parasitic diseases: Secondary | ICD-10-CM | POA: Insufficient documentation

## 2014-05-12 HISTORY — DX: Anemia, unspecified: D64.9

## 2014-05-12 LAB — COMPREHENSIVE METABOLIC PANEL
ALBUMIN: 2.8 g/dL — AB (ref 3.5–5.2)
ALT: 27 U/L (ref 0–35)
AST: 21 U/L (ref 0–37)
Alkaline Phosphatase: 127 U/L — ABNORMAL HIGH (ref 39–117)
Anion gap: 12 (ref 5–15)
BUN: 8 mg/dL (ref 6–23)
CALCIUM: 9.1 mg/dL (ref 8.4–10.5)
CO2: 21 mEq/L (ref 19–32)
Chloride: 102 mEq/L (ref 96–112)
Creatinine, Ser: 0.6 mg/dL (ref 0.50–1.10)
GFR calc Af Amer: >90 mL/min (ref >90)
GFR calc non Af Amer: >90 mL/min (ref >90)
Glucose, Bld: 80 mg/dL (ref 70–99)
POTASSIUM: 4.1 meq/L (ref 3.7–5.3)
Sodium: 135 mEq/L — ABNORMAL LOW (ref 137–147)
Total Bilirubin: <0.2 mg/dL — ABNORMAL LOW (ref 0.3–1.2)
Total Protein: 7.8 g/dL (ref 6.0–8.3)

## 2014-05-12 LAB — URINALYSIS, ROUTINE W REFLEX MICROSCOPIC
Bilirubin Urine: NEGATIVE
Glucose, UA: NEGATIVE mg/dL
Hgb urine dipstick: NEGATIVE
Ketones, ur: NEGATIVE mg/dL
Nitrite: NEGATIVE
PROTEIN: 30 mg/dL — AB
SPECIFIC GRAVITY, URINE: 1.038 — AB (ref 1.005–1.030)
Urobilinogen, UA: 1 mg/dL (ref 0.0–1.0)
pH: 6.5 (ref 5.0–8.0)

## 2014-05-12 LAB — CBC WITH DIFFERENTIAL/PLATELET
BASOS ABS: 0 10*3/uL (ref 0.0–0.1)
Basophils Relative: 0 % (ref 0–1)
EOS ABS: 0.2 10*3/uL (ref 0.0–0.7)
EOS PCT: 2 % (ref 0–5)
HCT: 30.2 % — ABNORMAL LOW (ref 36.0–46.0)
Hemoglobin: 9.7 g/dL — ABNORMAL LOW (ref 12.0–15.0)
Lymphocytes Relative: 26 % (ref 12–46)
Lymphs Abs: 2.4 10*3/uL (ref 0.7–4.0)
MCH: 23.4 pg — AB (ref 26.0–34.0)
MCHC: 32.1 g/dL (ref 30.0–36.0)
MCV: 72.9 fL — ABNORMAL LOW (ref 78.0–100.0)
Monocytes Absolute: 0.5 10*3/uL (ref 0.1–1.0)
Monocytes Relative: 6 % (ref 3–12)
Neutro Abs: 6 10*3/uL (ref 1.7–7.7)
Neutrophils Relative %: 66 % (ref 43–77)
Platelets: 365 10*3/uL (ref 150–400)
RBC: 4.14 MIL/uL (ref 3.87–5.11)
RDW: 16.2 % — AB (ref 11.5–15.5)
WBC: 9.1 10*3/uL (ref 4.0–10.5)

## 2014-05-12 LAB — PROTEIN / CREATININE RATIO, URINE
CREATININE, URINE: 269.8 mg/dL
PROTEIN CREATININE RATIO: 0.16 — AB (ref 0.00–0.15)
Total Protein, Urine: 44.5 mg/dL

## 2014-05-12 LAB — URINE MICROSCOPIC-ADD ON

## 2014-05-12 LAB — URIC ACID: URIC ACID, SERUM: 4.4 mg/dL (ref 2.4–7.0)

## 2014-05-12 LAB — LIPASE, BLOOD: Lipase: 26 U/L (ref 11–59)

## 2014-05-12 MED ORDER — SODIUM CHLORIDE 0.9 % IV BOLUS (SEPSIS)
1000.0000 mL | Freq: Once | INTRAVENOUS | Status: AC
  Administered 2014-05-12: 1000 mL via INTRAVENOUS

## 2014-05-12 MED ORDER — ACETAMINOPHEN 325 MG PO TABS
650.0000 mg | ORAL_TABLET | Freq: Once | ORAL | Status: AC
  Administered 2014-05-12: 650 mg via ORAL
  Filled 2014-05-12: qty 2

## 2014-05-12 MED ORDER — DIPHENHYDRAMINE HCL 50 MG/ML IJ SOLN
25.0000 mg | Freq: Once | INTRAMUSCULAR | Status: AC
  Administered 2014-05-12: 25 mg via INTRAVENOUS
  Filled 2014-05-12: qty 1

## 2014-05-12 MED ORDER — NITROFURANTOIN MONOHYD MACRO 100 MG PO CAPS: 100.0000 mg | ORAL_CAPSULE | Freq: Two times a day (BID) | ORAL | Status: AC

## 2014-05-12 MED ORDER — MAGNESIUM SULFATE 40 MG/ML IJ SOLN
2.0000 g | Freq: Once | INTRAMUSCULAR | Status: DC
  Filled 2014-05-12: qty 50

## 2014-05-12 MED ORDER — METOCLOPRAMIDE HCL 5 MG/ML IJ SOLN
10.0000 mg | Freq: Once | INTRAMUSCULAR | Status: AC
  Administered 2014-05-12: 10 mg via INTRAVENOUS
  Filled 2014-05-12: qty 2

## 2014-05-12 NOTE — ED Notes (Signed)
Paged rapid OB RN

## 2014-05-12 NOTE — Progress Notes (Signed)
1628  Arrived to evaluate this 28 yo G4P2 @30 .[redacted]wks GA here with complaints of severe right sided headache, lower abdomen pain, and recent back pain.  Denies bleeding or leaking of fluid.  Reports good fetal movement.  Abdomen is tender to palpation in the suprapubic region.  Denies urinary symptoms.  Denies blurry vision or other visual changes, denies dizziness, denies right upper quadrant abdominal pain or severe heartburn.  Mild swelling to bilateral lower extremities noted.  No other swelling noted.  1  Spoke with EDP regarding orders for magnesium sulfate and discussed EFM results and contractions.  [place] with Dr. Alwyn Pea of [facility].  Notified of patient's complaints of ED plan of care, of EFM and contractions, and of elevated BP's and SVE.  Recommends for further BP readings, and addition of Uric Acid and Protein/ Creatinine ratio to already ordered CBC, CMP, and UA.  Also notes patient positive for UTI in the office 05/07/14.

## 2014-05-12 NOTE — ED Notes (Signed)
Paged Rapid OB

## 2014-05-12 NOTE — ED Notes (Addendum)
Pt reports waking up this morning with a headache and pain in her right ear- neuro exam normal.  Pt also c/o of lower abdominal pain described as cramping that started 1 hour prior to arrival, pt admits to being [redacted] weeks pregnant- FTH 133bpm at present.  Pt denies taking any medications at home.

## 2014-05-12 NOTE — ED Provider Notes (Addendum)
I saw and evaluated the patient, reviewed the resident's note and I agree with the findings and plan.   EKG Interpretation None      Patient here with headache that began today described as sharp pain on the right side of her head. She is also [redacted] weeks pregnant and denies any vaginal bleeding at this time. Given medications here feels better. Neurological exam is nonfocal. She has been seen by the [facility] nurse has been experiencing contractions and her there digital exam is dilated 1-2 cm with a thinned cervix. Patient to be transferred to [facility]  7:33 PM Spoke with the [facility] nurse who has been in communication with the patient's obstetrician. Blood pressure stable. No evidence of help syndrome. Headache is improved and without neurological findings. Patient has been stable on the [facility] monitor. Patient's OB doctor will followup with her in the morning.  Leota Jacobsen, MD 05/12/14 1742  Leota Jacobsen, MD 05/12/14 [phone]

## 2014-05-12 NOTE — ED Notes (Signed)
[facility] RN recommended holding Mag Sulf until speaking with pt OB doctor.

## 2014-05-12 NOTE — Progress Notes (Signed)
FHT 140, reactive w/multiple accelerations; no contractions noted. Pt states she feels better, but sleepy after meds. Notified Dr. Alwyn Pea re: lab work, blood pressures. Pt to discharge home w/script for Macrobid, and follow up in OB office tomorrow. Discussed plan of care w/patient and Dr. Zenia Resides, both agreeable to plan.

## 2014-05-12 NOTE — ED Provider Notes (Signed)
CSN: [id number]     Arrival date & time 05/12/14  1513 History   First MD Initiated Contact with Patient 05/12/14 1601     Chief Complaint  Patient presents with  . Headache  . Otalgia  . Abdominal Pain    Patient is a 28 y.o. female presenting with headaches. The history is provided by the patient.  Headache Location: right sided. Quality:  Sharp Radiates to:  Face and R shoulder Onset quality:  Gradual Duration: all day since I woke up. Timing:  Constant Progression:  Unchanged Chronicity:  New Similar to prior headaches: no   Relieved by:  Nothing Associated symptoms: abdominal pain   Associated symptoms: no back pain, no cough, no diarrhea, no dizziness, no fever, no nausea, no neck pain, no neck stiffness, no numbness, no sore throat, no visual change and no vomiting     Onset of symptoms was today.  HA is right side, radiates to ear and face, feels stabbing, not relieved by tylenol, no photophobia or nausea. Denies RUQ pain, SOB, cough, chest pain, dysuria, hematuria, diarrhea, constipation or bloody stools. Good fetal movements. No h/o migraines. Lower abd pain started after she got out of the bathtub today. No trauma, no heavy lifting. Denies vaginal bleeding or leakage of fluid. Pt reports h/o HTN prior to becoming pregnant. Denies h/o preE or eclampsia.   Past Medical History  Diagnosis Date  . Sinus headache   . Hypertension   . Trichomonas   . Chlamydia   . Anemia    Past Surgical History  Procedure Laterality Date  . Dilation and evacuation  07/16/2012    Procedure: DILATATION AND EVACUATION;  Surgeon: Osborne Oman, MD;  Location: [facility];  Service: Gynecology;  Laterality: N/A;  . Abscess drainage      arms   Family History  Problem Relation Age of Onset  . Cancer Father    History  Substance Use Topics  . Smoking status: Never Smoker   . Smokeless tobacco: Never Used  . Alcohol Use: No   OB History   Grav Para Term Preterm Abortions TAB SAB Ect  Mult Living   4 2 2  1  1   2      Review of Systems  Constitutional: Negative for fever.  HENT: Negative for rhinorrhea and sore throat.   Eyes: Negative for visual disturbance.  Respiratory: Negative for cough, chest tightness and shortness of breath.   Cardiovascular: Negative for chest pain and palpitations.  Gastrointestinal: Positive for abdominal pain. Negative for nausea, vomiting, diarrhea, constipation and blood in stool.  Genitourinary: Negative for dysuria, hematuria, flank pain, vaginal bleeding and vaginal discharge.       Good fetal movement  Musculoskeletal: Negative for back pain, neck pain and neck stiffness.  Skin: Negative for rash.  Neurological: Positive for headaches. Negative for dizziness, weakness and numbness.  Psychiatric/Behavioral: Negative for confusion.  All other systems reviewed and are negative.     Allergies  Review of patient's allergies indicates no known allergies.  Home Medications   Prior to Admission medications   Medication Sig Start Date End Date Taking? Authorizing Provider  acetaminophen (TYLENOL) 500 MG tablet Take 500 mg by mouth every 6 (six) hours as needed for moderate pain.   Yes Historical Provider, MD  Prenatal Vit-Fe Fumarate-FA (PRENATAL MULTIVITAMIN) TABS tablet Take 1 tablet by mouth daily.    Yes Historical Provider, MD   BP 142/82  Pulse 76  Temp(Src) 98.1 F (36.7 C) (Oral)  Resp 18  Ht 5\' 6"  (1.676 m)  Wt 290 lb (131.543 kg)  BMI 46.83 kg/m2  SpO2 100%  LMP 10/12/2013 Physical Exam  Constitutional: She is oriented to person, place, and time. She appears well-developed and well-nourished. No distress.  Appears uncomfortable  HENT:  Head: Normocephalic and atraumatic.  Mouth/Throat: Oropharynx is clear and moist.  Eyes: EOM are normal. Pupils are equal, round, and reactive to light.  Neck: Neck supple. No JVD present.  Cardiovascular: Normal rate, regular rhythm, normal heart sounds and intact distal pulses.   Exam reveals no gallop.   No murmur heard. Pulmonary/Chest: Effort normal and breath sounds normal. She has no wheezes. She has no rales.  Abdominal: Soft. She exhibits no distension. There is tenderness in the suprapubic area. There is no rigidity, no rebound and no guarding.  Gravid uterus appropriate for GA  Musculoskeletal: Normal range of motion. She exhibits no tenderness.  Neurological: She is alert and oriented to person, place, and time. No cranial nerve deficit. She exhibits normal muscle tone.  Skin: Skin is warm and dry. No rash noted.  Psychiatric: Her behavior is normal.    ED Course  Procedures  None   Labs Review Labs Reviewed  CBC WITH DIFFERENTIAL - Abnormal; Notable for the following:    Hemoglobin 9.7 (*)    HCT 30.2 (*)    MCV 72.9 (*)    MCH 23.4 (*)    RDW 16.2 (*)    All other components within normal limits  COMPREHENSIVE METABOLIC PANEL - Abnormal; Notable for the following:    Sodium 135 (*)    Albumin 2.8 (*)    Alkaline Phosphatase 127 (*)    Total Bilirubin <0.2 (*)    All other components within normal limits  URINALYSIS, ROUTINE W REFLEX MICROSCOPIC - Abnormal; Notable for the following:    APPearance CLOUDY (*)    Specific Gravity, Urine 1.038 (*)    Protein, ur 30 (*)    Leukocytes, UA TRACE (*)    All other components within normal limits  PROTEIN / CREATININE RATIO, URINE - Abnormal; Notable for the following:    Protein Creatinine Ratio 0.16 (*)    All other components within normal limits  URINE MICROSCOPIC-ADD ON - Abnormal; Notable for the following:    Squamous Epithelial / LPF MANY (*)    Bacteria, UA FEW (*)    All other components within normal limits  LIPASE, BLOOD  URIC ACID     MDM   Final diagnoses:  Headache, unspecified headache type  Abdominal pain, lower    Patient is 7 mos pregnant and presents with HA and lower abd pain since this morning. Neuro exam unremarkable. Bilateral TMs clear, doubt otitis media.  Lower abd mildly tender on exam, no peritonitis. Gravid uterus appropriate for gestational age. Rapid OB to monitor FHTs. Will check labs and urine. HA cocktail (reglan, benadryl, Mag) given in NS bolus. No hyperreflexia. Doubt pre-E. DDx includes primary headache, trigeminal neuralgia, ramsey hunt. 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  Rapid OB team has seen patient and feel she is having contractions. They will check cervix and contact on-call OB attending. Will check for proteinuria. Patient cervix is 2 cm dilated and then and she continues to contract.  Patient has large burn area with an elevated urine protein creatinine ratio. Upon further evaluation, B. team feels patient is stable for discharge at this time. They believe her hypertension is secondary to pain. The patient is  no longer contracting. Magnesium was held at [facility] request area. Agents headache is improved with treatment. Patient will be discharged with Macrobid. They will see the patient in clinic tomorrow. Agent is stable for discharge at this time.  Case discussed with Dr. Zenia Resides.   Gustavus Bryant, MD 05/12/14 [phone]

## 2014-05-15 NOTE — ED Provider Notes (Signed)
I saw and evaluated the patient, reviewed the resident's note and I agree with the findings and plan.  Leota Jacobsen, MD 05/15/14 [id number]

## 2014-05-25 ENCOUNTER — Encounter (HOSPITAL_COMMUNITY): Payer: Self-pay | Admitting: Emergency Medicine

## 2014-05-29 ENCOUNTER — Inpatient Hospital Stay (HOSPITAL_COMMUNITY)
Admission: AD | Admit: 2014-05-29 | Discharge: 2014-05-29 | Disposition: A | Discharge status: Home / Self Care | Payer: Medicaid Other | Source: Ambulatory Visit | Attending: Obstetrics & Gynecology | Admitting: Obstetrics & Gynecology

## 2014-05-29 ENCOUNTER — Encounter (HOSPITAL_COMMUNITY): Payer: Self-pay

## 2014-05-29 DIAGNOSIS — O26899 Other specified pregnancy related conditions, unspecified trimester: Secondary | ICD-10-CM

## 2014-05-29 DIAGNOSIS — O99013 Anemia complicating pregnancy, third trimester: Secondary | ICD-10-CM

## 2014-05-29 DIAGNOSIS — Z3A33 33 weeks gestation of pregnancy: Secondary | ICD-10-CM | POA: Diagnosis not present

## 2014-05-29 DIAGNOSIS — D509 Iron deficiency anemia, unspecified: Secondary | ICD-10-CM | POA: Diagnosis not present

## 2014-05-29 DIAGNOSIS — R102 Pelvic and perineal pain: Secondary | ICD-10-CM | POA: Insufficient documentation

## 2014-05-29 DIAGNOSIS — O9989 Other specified diseases and conditions complicating pregnancy, childbirth and the puerperium: Secondary | ICD-10-CM

## 2014-05-29 LAB — COMPREHENSIVE METABOLIC PANEL
ALT: 9 U/L (ref 0–35)
AST: 12 U/L (ref 0–37)
Albumin: 2.4 g/dL — ABNORMAL LOW (ref 3.5–5.2)
Alkaline Phosphatase: 119 U/L — ABNORMAL HIGH (ref 39–117)
Anion gap: 11 (ref 5–15)
BUN: 8 mg/dL (ref 6–23)
CALCIUM: 8.7 mg/dL (ref 8.4–10.5)
CHLORIDE: 103 meq/L (ref 96–112)
CO2: 23 mEq/L (ref 19–32)
CREATININE: 0.62 mg/dL (ref 0.50–1.10)
GFR calc Af Amer: >90 mL/min (ref >90)
GFR calc non Af Amer: >90 mL/min (ref >90)
Glucose, Bld: 91 mg/dL (ref 70–99)
Potassium: 4.2 mEq/L (ref 3.7–5.3)
Sodium: 137 mEq/L (ref 137–147)
Total Protein: 6.2 g/dL (ref 6.0–8.3)

## 2014-05-29 LAB — CBC
HEMATOCRIT: 26.4 % — AB (ref 36.0–46.0)
Hemoglobin: 8.2 g/dL — ABNORMAL LOW (ref 12.0–15.0)
MCH: 22.7 pg — ABNORMAL LOW (ref 26.0–34.0)
MCHC: 31.1 g/dL (ref 30.0–36.0)
MCV: 73.1 fL — AB (ref 78.0–100.0)
Platelets: 294 10*3/uL (ref 150–400)
RBC: 3.61 MIL/uL — AB (ref 3.87–5.11)
RDW: 17.1 % — ABNORMAL HIGH (ref 11.5–15.5)
WBC: 8.9 10*3/uL (ref 4.0–10.5)

## 2014-05-29 LAB — URINE MICROSCOPIC-ADD ON

## 2014-05-29 LAB — URINALYSIS, ROUTINE W REFLEX MICROSCOPIC
BILIRUBIN URINE: NEGATIVE
Glucose, UA: NEGATIVE mg/dL
Hgb urine dipstick: NEGATIVE
Ketones, ur: NEGATIVE mg/dL
Leukocytes, UA: NEGATIVE
NITRITE: NEGATIVE
PH: 6 (ref 5.0–8.0)
Protein, ur: 30 mg/dL — AB
Urobilinogen, UA: 1 mg/dL (ref 0.0–1.0)

## 2014-05-29 MED ORDER — CYCLOBENZAPRINE HCL 5 MG PO TABS: 5.0000 mg | ORAL_TABLET | Freq: Three times a day (TID) | ORAL | Status: DC | PRN

## 2014-05-29 MED ORDER — CYCLOBENZAPRINE HCL 10 MG PO TABS
10.0000 mg | ORAL_TABLET | Freq: Once | ORAL | Status: AC
  Administered 2014-05-29: 10 mg via ORAL
  Filled 2014-05-29: qty 1

## 2014-05-29 NOTE — MAU Note (Signed)
Patient states her contractions started this morning at 1000.

## 2014-05-29 NOTE — MAU Provider Note (Signed)
Chief Complaint:  Contractions   First Provider Initiated Contact with Patient 05/29/14 1737      HPI: Amber Sanders is a 28 y.o. [id number] at [redacted]w[redacted]d who presents to maternity admissions reporting intermittent left and right lower abdominal pain that began yesterday but has become increasingly painful today. She describes it as contractions. Pain is exacerbated by moving and changing positions. Took 2 Tylenol without relief. Denies leakage of fluid or vaginal bleeding. Good fetal movement.  Denies dysuria, hematuria, frequency or urgency of urination.  Pregnancy Course: Hx CHTN, syphilis, migraines.  Past Medical History: Past Medical History  Diagnosis Date  . Sinus headache   . Hypertension   . Trichomonas   . Chlamydia   . Anemia     Past obstetric history: OB History  Gravida Para Term Preterm AB SAB TAB Ectopic Multiple Living  4 2 2  1 1    2     # Outcome Date GA Lbr Len/2nd Weight Sex Delivery Anes PTL Lv  4 Current           3 SAB           2 Term         Y  1 Term         Y      Past Surgical History: Past Surgical History  Procedure Laterality Date  . Dilation and evacuation  07/16/2012    Procedure: DILATATION AND EVACUATION;  Surgeon: Osborne Oman, MD;  Location: [facility];  Service: Gynecology;  Laterality: N/A;  . Abscess drainage      arms     Family History: Family History  Problem Relation Age of Onset  . Cancer Father     Social History: History  Substance Use Topics  . Smoking status: Never Smoker   . Smokeless tobacco: Never Used  . Alcohol Use: No    Allergies: No Known Allergies  Meds:  Prescriptions prior to admission  Medication Sig Dispense Refill Last Dose  . acetaminophen (TYLENOL) 500 MG tablet Take 1,000 mg by mouth every 6 (six) hours as needed for moderate pain.    05/29/2014 at Unknown time  . Prenatal Vit-Fe Fumarate-FA (PRENATAL MULTIVITAMIN) TABS tablet Take 1 tablet by mouth daily.    05/29/2014 at Unknown time     ROS: Pertinent findings in history of present illness.  Physical Exam  Blood pressure 143/78, pulse 93, temperature 98.3 F (36.8 C), temperature source Tympanic, resp. rate 20, last menstrual period 10/12/2013, unknown if currently breastfeeding. GENERAL: Obese female crying in apparent discomfort with position change HEENT: normocephalic HEART: normal rate RESP: normal effort ABDOMEN: Soft, bilateral groin and diffuse abdominal tenderness to palpation, gravid appropriate for gestational age EXTREMITIES: Nontender, no edema NEURO: alert and oriented  Dilation: 1 Effacement (%): Thick Cervical Position: Posterior Presentation: Vertex  Cx posterior, ext 1, int closed/long/high  FHT:  Baseline 130, reactive Contractions: none, slight UI   Labs: Results for orders placed or performed during the hospital encounter of 05/29/14 (from the past 24 hour(s))  Urinalysis, Routine w reflex microscopic     Status: Abnormal   Collection Time: 05/29/14  4:55 PM  Result Value Ref Range   Color, Urine YELLOW YELLOW   APPearance HAZY (A) CLEAR   Specific Gravity, Urine >1.030 (H) 1.005 - 1.030   pH 6.0 5.0 - 8.0   Glucose, UA NEGATIVE NEGATIVE mg/dL   Hgb urine dipstick NEGATIVE NEGATIVE   Bilirubin Urine NEGATIVE NEGATIVE   Ketones, ur  NEGATIVE NEGATIVE mg/dL   Protein, ur 30 (A) NEGATIVE mg/dL   Urobilinogen, UA 1.0 0.0 - 1.0 mg/dL   Nitrite NEGATIVE NEGATIVE   Leukocytes, UA NEGATIVE NEGATIVE  Urine microscopic-add on     Status: Abnormal   Collection Time: 05/29/14  4:55 PM  Result Value Ref Range   Squamous Epithelial / LPF MANY (A) RARE   WBC, UA 0-2 <3 WBC/hpf   Urine-Other MUCOUS PRESENT   Comprehensive metabolic panel     Status: Abnormal   Collection Time: 05/29/14  6:50 PM  Result Value Ref Range   Sodium 137 137 - 147 mEq/L   Potassium 4.2 3.7 - 5.3 mEq/L   Chloride 103 96 - 112 mEq/L   CO2 23 19 - 32 mEq/L   Glucose, Bld 91 70 - 99 mg/dL   BUN 8 6 - 23 mg/dL    Creatinine, Ser 0.62 0.50 - 1.10 mg/dL   Calcium 8.7 8.4 - 10.5 mg/dL   Total Protein 6.2 6.0 - 8.3 g/dL   Albumin 2.4 (L) 3.5 - 5.2 g/dL   AST 12 0 - 37 U/L   ALT 9 0 - 35 U/L   Alkaline Phosphatase 119 (H) 39 - 117 U/L   Total Bilirubin <0.2 (L) 0.3 - 1.2 mg/dL   GFR calc non Af Amer >90 >90 mL/min   GFR calc Af Amer >90 >90 mL/min   Anion gap 11 5 - 15  CBC     Status: Abnormal   Collection Time: 05/29/14  6:50 PM  Result Value Ref Range   WBC 8.9 4.0 - 10.5 K/uL   RBC 3.61 (L) 3.87 - 5.11 MIL/uL   Hemoglobin 8.2 (L) 12.0 - 15.0 g/dL   HCT 26.4 (L) 36.0 - 46.0 %   MCV 73.1 (L) 78.0 - 100.0 fL   MCH 22.7 (L) 26.0 - 34.0 pg   MCHC 31.1 30.0 - 36.0 g/dL   RDW 17.1 (H) 11.5 - 15.5 %   Platelets 294 150 - 400 K/uL    Imaging:  US Ob Limited  05/05/2014   OBSTETRICAL ULTRASOUND: This exam was performed within a [facility]. The OB US report was generated in the [facility] system, and faxed to the ordering physician.   This report is available in the [employer]. See the [facility] Obstetric US report via the Image Link.  MAU Course: Flexeril 10mg  po given> dozing, reports partial relief C/W Dr. Alwyn Pea CBC, CMP  Assessment: 1. Pain of round ligament affecting pregnancy, antepartum   2. Iron deficiency anemia of pregnancy, third trimester     Plan: Discharge home PTL precautions and fetal kick counts Advised to take PNV 1/d, FeSO4 1 bid   Medication List    TAKE these medications        acetaminophen 500 MG tablet  Commonly known as:  TYLENOL  Take 1,000 mg by mouth every 6 (six) hours as needed for moderate pain.     cyclobenzaprine 5 MG tablet  Commonly known as:  FLEXERIL  Take 1 tablet (5 mg total) by mouth 3 (three) times daily as needed for muscle spasms.     prenatal multivitamin Tabs tablet  Take 1 tablet by mouth daily.       Follow-up Information    Follow up with [facility], Andrez Grime, MD.   Specialty:  Obstetrics and Gynecology   Why:  Keep  your scheduled prenatal appointment   Contact information:   [address]  [place] [address] [place], CNM 05/29/2014 5:49 PM

## 2014-05-29 NOTE — MAU Note (Signed)
Been having pains in lower abd and low back since this morning, getting worse.  Feels like contractions.  Been having a bad headache, took tylenol- no relief

## 2014-05-29 NOTE — Discharge Instructions (Signed)

## 2014-06-22 ENCOUNTER — Encounter (HOSPITAL_COMMUNITY): Payer: Self-pay | Admitting: General Practice

## 2014-06-22 ENCOUNTER — Inpatient Hospital Stay (HOSPITAL_COMMUNITY)
Admission: AD | Admit: 2014-06-22 | Discharge: 2014-06-22 | Disposition: A | Discharge status: Home / Self Care | Payer: Medicaid Other | Source: Ambulatory Visit | Attending: Obstetrics and Gynecology | Admitting: Obstetrics and Gynecology

## 2014-06-22 DIAGNOSIS — O212 Late vomiting of pregnancy: Secondary | ICD-10-CM | POA: Diagnosis present

## 2014-06-22 DIAGNOSIS — Z3A36 36 weeks gestation of pregnancy: Secondary | ICD-10-CM | POA: Diagnosis not present

## 2014-06-22 DIAGNOSIS — O10013 Pre-existing essential hypertension complicating pregnancy, third trimester: Secondary | ICD-10-CM | POA: Insufficient documentation

## 2014-06-22 DIAGNOSIS — O219 Vomiting of pregnancy, unspecified: Secondary | ICD-10-CM

## 2014-06-22 LAB — CBC
HCT: 26.2 % — ABNORMAL LOW (ref 36.0–46.0)
HEMOGLOBIN: 8.1 g/dL — AB (ref 12.0–15.0)
MCH: 21.8 pg — AB (ref 26.0–34.0)
MCHC: 30.9 g/dL (ref 30.0–36.0)
MCV: 70.6 fL — ABNORMAL LOW (ref 78.0–100.0)
PLATELETS: 350 10*3/uL (ref 150–400)
RBC: 3.71 MIL/uL — ABNORMAL LOW (ref 3.87–5.11)
RDW: 18.9 % — ABNORMAL HIGH (ref 11.5–15.5)
WBC: 8.6 10*3/uL (ref 4.0–10.5)

## 2014-06-22 LAB — URIC ACID: URIC ACID, SERUM: 5.8 mg/dL (ref 2.4–7.0)

## 2014-06-22 LAB — COMPREHENSIVE METABOLIC PANEL
ALT: 9 U/L (ref 0–35)
AST: 15 U/L (ref 0–37)
Albumin: 2.5 g/dL — ABNORMAL LOW (ref 3.5–5.2)
Alkaline Phosphatase: 150 U/L — ABNORMAL HIGH (ref 39–117)
Anion gap: 11 (ref 5–15)
BUN: 9 mg/dL (ref 6–23)
CO2: 23 mEq/L (ref 19–32)
Calcium: 8.9 mg/dL (ref 8.4–10.5)
Chloride: 103 mEq/L (ref 96–112)
Creatinine, Ser: 0.84 mg/dL (ref 0.50–1.10)
GFR calc Af Amer: >90 mL/min (ref >90)
GFR calc non Af Amer: >90 mL/min (ref >90)
GLUCOSE: 82 mg/dL (ref 70–99)
POTASSIUM: 3.8 meq/L (ref 3.7–5.3)
Sodium: 137 mEq/L (ref 137–147)
TOTAL PROTEIN: 6.6 g/dL (ref 6.0–8.3)
Total Bilirubin: <0.2 mg/dL — ABNORMAL LOW (ref 0.3–1.2)

## 2014-06-22 LAB — URINALYSIS, ROUTINE W REFLEX MICROSCOPIC
BILIRUBIN URINE: NEGATIVE
Glucose, UA: NEGATIVE mg/dL
HGB URINE DIPSTICK: NEGATIVE
Ketones, ur: NEGATIVE mg/dL
Nitrite: NEGATIVE
PH: 7 (ref 5.0–8.0)
Protein, ur: NEGATIVE mg/dL
SPECIFIC GRAVITY, URINE: 1.025 (ref 1.005–1.030)
Urobilinogen, UA: 1 mg/dL (ref 0.0–1.0)

## 2014-06-22 LAB — PROTEIN / CREATININE RATIO, URINE
CREATININE, URINE: 197.73 mg/dL
Protein Creatinine Ratio: 0.12 (ref 0.00–0.15)
TOTAL PROTEIN, URINE: 23.7 mg/dL

## 2014-06-22 LAB — LACTATE DEHYDROGENASE: LDH: 173 U/L (ref 94–250)

## 2014-06-22 LAB — URINE MICROSCOPIC-ADD ON

## 2014-06-22 MED ORDER — OMEPRAZOLE 20 MG PO CPDR: 20.0000 mg | DELAYED_RELEASE_CAPSULE | Freq: Every day | ORAL | Status: DC

## 2014-06-22 MED ORDER — PROMETHAZINE HCL 25 MG PO TABS: 25.0000 mg | ORAL_TABLET | Freq: Four times a day (QID) | ORAL | Status: DC | PRN

## 2014-06-22 MED ORDER — PROMETHAZINE HCL 25 MG/ML IJ SOLN
25.0000 mg | Freq: Once | INTRAMUSCULAR | Status: AC
  Administered 2014-06-22: 25 mg via INTRAVENOUS
  Filled 2014-06-22: qty 1

## 2014-06-22 NOTE — MAU Provider Note (Signed)
History     CSN: [id number]  Arrival date and time: 06/22/14 1235   First Provider Initiated Contact with Patient 06/22/14 1344      Chief Complaint  Patient presents with  . Emesis  . Abdominal Pain  . Back Pain   HPI   Amber Sanders is a 28 y.o. female [phone] at 72w4dwho presents with nausea and vomiting that started 4 days ago. She has vomited twice today and on average has vomited 3 times per day; she denies diarrhea. No one else in her house is sick and she has not taken anything for the vomiting. She is also experiencing bilateral lower abdominal pain; the pain feels like the babies head is pushing down. She feels some occasional contractions.   OB History    Gravida Para Term Preterm AB TAB SAB Ectopic Multiple Living   _0 Past Medical History  Diagnosis Date  . Sinus headache   . Hypertension   . Trichomonas   . Chlamydia   . Anemia     Past Surgical History  Procedure Laterality Date  . Dilation and evacuation  07/16/2012    Procedure: DILATATION AND EVACUATION;  Surgeon: UOsborne Oman MD;  [facility];  Service: Gynecology;  Laterality: N/A;  . Abscess drainage      arms    Family History  Problem Relation Age of Onset  . Cancer Father     History  Substance Use Topics  . Smoking status: Never Smoker   . Smokeless tobacco: Never Used  . Alcohol Use: No    Allergies: No Known Allergies  Prescriptions prior to admission  Medication Sig Dispense Refill Last Dose  . Prenatal Vit-Fe Fumarate-FA (PRENATAL MULTIVITAMIN) TABS tablet Take 1 tablet by mouth daily.    06/22/2014 at Unknown time  . cyclobenzaprine (FLEXERIL) 5 MG tablet Take 1 tablet (5 mg total) by mouth 3 (three) times daily as needed for muscle spasms. 30 tablet 0 06/01/2014   Results for orders placed or performed during the hospital encounter of 06/22/14 (from the past 48 hour(s))  Urinalysis, Routine w reflex microscopic     Status: Abnormal   Collection Time: 06/22/14 12:55 PM  Result Value Ref Range   Color, Urine YELLOW YELLOW   APPearance CLOUDY (A) CLEAR   Specific Gravity, Urine 1.025 1.005 - 1.030   pH 7.0 5.0 - 8.0   Glucose, UA NEGATIVE NEGATIVE mg/dL   Hgb urine dipstick NEGATIVE NEGATIVE   Bilirubin Urine NEGATIVE NEGATIVE   Ketones, ur NEGATIVE NEGATIVE mg/dL   Protein, ur NEGATIVE NEGATIVE mg/dL   Urobilinogen, UA 1.0 0.0 - 1.0 mg/dL   Nitrite NEGATIVE NEGATIVE   Leukocytes, UA LARGE (A) NEGATIVE  Urine microscopic-add on     Status: Abnormal   Collection Time: 06/22/14 12:55 PM  Result Value Ref Range   Squamous Epithelial / LPF MANY (A) RARE   WBC, UA 7-10 <3 WBC/hpf   Bacteria, UA FEW (A) RARE  Protein / creatinine ratio, urine     Status: None   Collection Time: 06/22/14 12:55 PM  Result Value Ref Range   Creatinine, Urine 197.73 mg/dL   Total Protein, Urine 23.7 mg/dL    Comment: NO NORMAL RANGE ESTABLISHED FOR THIS TEST   Protein Creatinine Ratio 0.12 0.00 - 0.15  CBC     Status: Abnormal   Collection Time: 06/22/14  2:16 PM  Result Value  Ref Range   WBC 8.6 4.0 - 10.5 K/uL   RBC 3.71 (L) 3.87 - 5.11 MIL/uL   Hemoglobin 8.1 (L) 12.0 - 15.0 g/dL   HCT 26.2 (L) 36.0 - 46.0 %   MCV 70.6 (L) 78.0 - 100.0 fL   MCH 21.8 (L) 26.0 - 34.0 pg   MCHC 30.9 30.0 - 36.0 g/dL   RDW 18.9 (H) 11.5 - 15.5 %   Platelets 350 150 - 400 K/uL  Comprehensive metabolic panel     Status: Abnormal   Collection Time: 06/22/14  2:16 PM  Result Value Ref Range   Sodium 137 137 - 147 mEq/L   Potassium 3.8 3.7 - 5.3 mEq/L   Chloride 103 96 - 112 mEq/L   CO2 23 19 - 32 mEq/L   Glucose, Bld 82 70 - 99 mg/dL   BUN 9 6 - 23 mg/dL   Creatinine, Ser 0.84 0.50 - 1.10 mg/dL   Calcium 8.9 8.4 - 10.5 mg/dL   Total Protein 6.6 6.0 - 8.3 g/dL   Albumin 2.5 (L) 3.5 - 5.2 g/dL   AST 15 0 - 37 U/L   ALT 9 0 - 35 U/L   Alkaline Phosphatase 150 (H) 39 - 117 U/L   Total Bilirubin <0.2 (L) 0.3 - 1.2 mg/dL   GFR calc non Af Amer >90  >90 mL/min   GFR calc Af Amer >90 >90 mL/min    Comment: (NOTE) The eGFR has been calculated using the CKD EPI equation. This calculation has not been validated in all clinical situations. eGFR's persistently <90 mL/min signify possible Chronic Kidney Disease.    Anion gap 11 5 - 15  Lactate dehydrogenase     Status: None   Collection Time: 06/22/14  2:16 PM  Result Value Ref Range   LDH 173 94 - 250 U/L  Uric acid     Status: None   Collection Time: 06/22/14  2:16 PM  Result Value Ref Range   Uric Acid, Serum 5.8 2.4 - 7.0 mg/dL    Review of Systems  Constitutional: Negative for fever and chills.  Gastrointestinal: Positive for nausea, vomiting and abdominal pain (Lower abdominal pain ). Negative for diarrhea and constipation.  Genitourinary: Negative for dysuria, urgency, frequency and hematuria.  Musculoskeletal: Positive for back pain (Bilateral lower back pain ).   Physical Exam   Blood pressure 118/53, pulse 79, temperature 98.4 F (36.9 C), temperature source Oral, resp. rate 20, height 5' 5" (1.651 m), weight 137.712 kg (303 lb 9.6 oz), last menstrual period 10/12/2013, SpO2 100 %, unknown if currently breastfeeding.  Physical Exam  Constitutional: She is oriented to person, place, and time. She appears well-developed and well-nourished. No distress.  HENT:  Head: Normocephalic.  Eyes: Pupils are equal, round, and reactive to light.  Neck: Neck supple.  Cardiovascular: Normal rate and normal heart sounds.   Respiratory: Effort normal and breath sounds normal.  GI: Soft. Normal appearance. There is generalized tenderness.  Genitourinary:  Dilation: 1 Effacement (%): Thick Cervical Position: Posterior, [place]: Ballotable Exam by:: cheryl motte,rn  Musculoskeletal: Normal range of motion.       Right ankle: She exhibits swelling (+1 pitting ).       Left ankle: She exhibits swelling (+1 pitting ).  Neurological: She is alert and oriented to person, place,  and time. She has normal reflexes.  Skin: Skin is warm. She is not diaphoretic.  Psychiatric: Her behavior is normal.   Fetal Tracing: Baseline: 135 bpm  Variability: Moderate  Accelerations: 15x15 Decelerations: None Toco: UI with occasional contractions   MAU Course  Procedures  None  MDM LR with phenergan infusion  CBC CMP Uric acid LDH Protein creatine ratio normal  Patient tolerated PO fluids in MAU  Assessment and Plan   A: Nausea and vomiting in pregnancy   P: Discharge home in stable condition  RX: Phenergan Patient to do a trial of Prilosec Follow up with PCP as scheduled or sooner if needed    Ronnald Ramp, NP 06/22/2014 4:52 PM

## 2014-06-22 NOTE — MAU Note (Signed)
Patient states she has had vomiting since 11-27 but was only in the mornings, has been more frequent since yesterday. Has abdominal and back pain and feels weak all over. Denies bleeding or leaking and reports good fetal movement.

## 2014-06-23 LAB — URINE CULTURE: SPECIAL REQUESTS: NORMAL

## 2014-06-26 LAB — OB RESULTS CONSOLE GBS: STREP GROUP B AG: NEGATIVE

## 2014-06-27 ENCOUNTER — Inpatient Hospital Stay (HOSPITAL_COMMUNITY)
Admission: AD | Admit: 2014-06-27 | Discharge: 2014-06-28 | Disposition: A | Discharge status: Home / Self Care | Payer: Medicaid Other | Source: Ambulatory Visit | Attending: Obstetrics and Gynecology | Admitting: Obstetrics and Gynecology

## 2014-06-27 ENCOUNTER — Encounter (HOSPITAL_COMMUNITY): Payer: Self-pay | Admitting: *Deleted

## 2014-06-27 DIAGNOSIS — Z3A37 37 weeks gestation of pregnancy: Secondary | ICD-10-CM | POA: Insufficient documentation

## 2014-06-27 DIAGNOSIS — M545 Low back pain: Secondary | ICD-10-CM | POA: Insufficient documentation

## 2014-06-27 DIAGNOSIS — D649 Anemia, unspecified: Secondary | ICD-10-CM | POA: Diagnosis not present

## 2014-06-27 DIAGNOSIS — R109 Unspecified abdominal pain: Secondary | ICD-10-CM | POA: Insufficient documentation

## 2014-06-27 DIAGNOSIS — O9989 Other specified diseases and conditions complicating pregnancy, childbirth and the puerperium: Secondary | ICD-10-CM | POA: Insufficient documentation

## 2014-06-27 DIAGNOSIS — O99013 Anemia complicating pregnancy, third trimester: Secondary | ICD-10-CM | POA: Insufficient documentation

## 2014-06-27 DIAGNOSIS — O99019 Anemia complicating pregnancy, unspecified trimester: Secondary | ICD-10-CM

## 2014-06-27 HISTORY — DX: Syphilis, unspecified: A53.9

## 2014-06-27 LAB — CBC
HCT: 24.9 % — ABNORMAL LOW (ref 36.0–46.0)
Hemoglobin: 7.8 g/dL — ABNORMAL LOW (ref 12.0–15.0)
MCH: 22 pg — ABNORMAL LOW (ref 26.0–34.0)
MCHC: 31.3 g/dL (ref 30.0–36.0)
MCV: 70.3 fL — ABNORMAL LOW (ref 78.0–100.0)
PLATELETS: 292 10*3/uL (ref 150–400)
RBC: 3.54 MIL/uL — ABNORMAL LOW (ref 3.87–5.11)
RDW: 18.2 % — AB (ref 11.5–15.5)
WBC: 9.4 10*3/uL (ref 4.0–10.5)

## 2014-06-27 LAB — COMPREHENSIVE METABOLIC PANEL
ALBUMIN: 2.4 g/dL — AB (ref 3.5–5.2)
ALK PHOS: 148 U/L — AB (ref 39–117)
ALT: 9 U/L (ref 0–35)
AST: 16 U/L (ref 0–37)
Anion gap: 12 (ref 5–15)
BUN: 9 mg/dL (ref 6–23)
CO2: 22 meq/L (ref 19–32)
Calcium: 8.9 mg/dL (ref 8.4–10.5)
Chloride: 103 mEq/L (ref 96–112)
Creatinine, Ser: 0.74 mg/dL (ref 0.50–1.10)
GFR calc Af Amer: >90 mL/min (ref >90)
Glucose, Bld: 108 mg/dL — ABNORMAL HIGH (ref 70–99)
POTASSIUM: 3.7 meq/L (ref 3.7–5.3)
Sodium: 137 mEq/L (ref 137–147)
Total Protein: 6.2 g/dL (ref 6.0–8.3)

## 2014-06-27 LAB — URINALYSIS, ROUTINE W REFLEX MICROSCOPIC
BILIRUBIN URINE: NEGATIVE
Glucose, UA: NEGATIVE mg/dL
HGB URINE DIPSTICK: NEGATIVE
Ketones, ur: 15 mg/dL — AB
Nitrite: NEGATIVE
PH: 6 (ref 5.0–8.0)
Protein, ur: NEGATIVE mg/dL
Specific Gravity, Urine: >1.03 — ABNORMAL HIGH (ref 1.005–1.030)
UROBILINOGEN UA: 0.2 mg/dL (ref 0.0–1.0)

## 2014-06-27 LAB — URINE MICROSCOPIC-ADD ON

## 2014-06-27 LAB — PROTEIN / CREATININE RATIO, URINE
Creatinine, Urine: 348.04 mg/dL
Protein Creatinine Ratio: 0.1 (ref 0.00–0.15)
Total Protein, Urine: 36.4 mg/dL

## 2014-06-27 MED ORDER — CYCLOBENZAPRINE HCL 10 MG PO TABS
10.0000 mg | ORAL_TABLET | Freq: Once | ORAL | Status: AC
  Administered 2014-06-27: 10 mg via ORAL
  Filled 2014-06-27: qty 1

## 2014-06-27 NOTE — MAU Provider Note (Signed)
History     CSN: [id number]  Arrival date and time: 06/27/14 2005   First Provider Initiated Contact with Patient 06/27/14 2108      Chief Complaint  Patient presents with  . Headache  . Contractions   HPI  Pt is a 28 yo G4P2012 at [redacted]w[redacted]d wks IUP here with report of being at work and and at a certain point could not stand or walk due to the abdominal pain and lower back pain.  + headache that started at 1400 this afternoon.  No vision changes or upper abdominal pain.  Also reports generalized body aches.  Denies fever or chills. Denies LOF or bleeding  Past Medical History  Diagnosis Date  . Sinus headache   . Hypertension   . Trichomonas   . Chlamydia   . Anemia     Past Surgical History  Procedure Laterality Date  . Dilation and evacuation  07/16/2012    Procedure: DILATATION AND EVACUATION;  Surgeon: Osborne Oman, MD;  Location: [facility];  Service: Gynecology;  Laterality: N/A;  . Abscess drainage      arms    Family History  Problem Relation Age of Onset  . Cancer Father     History  Substance Use Topics  . Smoking status: Never Smoker   . Smokeless tobacco: Never Used  . Alcohol Use: No    Allergies: No Known Allergies  Prescriptions prior to admission  Medication Sig Dispense Refill Last Dose  . Prenatal Vit-Fe Fumarate-FA (PRENATAL MULTIVITAMIN) TABS tablet Take 1 tablet by mouth daily.    06/27/2014 at Unknown time  . cyclobenzaprine (FLEXERIL) 5 MG tablet Take 1 tablet (5 mg total) by mouth 3 (three) times daily as needed for muscle spasms. 30 tablet 0 06/01/2014  . omeprazole (PRILOSEC) 20 MG capsule Take 1 capsule (20 mg total) by mouth daily. 30 capsule 0   . promethazine (PHENERGAN) 25 MG tablet Take 1 tablet (25 mg total) by mouth every 6 (six) hours as needed for nausea or vomiting. 30 tablet 0     Review of Systems  Constitutional: Negative for fever and chills.  Eyes: Negative for blurred vision, double vision and photophobia.   Cardiovascular: Negative for chest pain.  Gastrointestinal: Positive for abdominal pain (lower pelvic). Negative for nausea, vomiting and diarrhea.  Genitourinary: Negative for dysuria, urgency and frequency.  Neurological: Positive for weakness and headaches.   Physical Exam   Blood pressure 142/89, pulse 124, resp. rate 20, height 5\' 6"  (1.676 m), weight 140.252 kg (309 lb 3.2 oz), last menstrual period 10/12/2013, unknown if currently breastfeeding.  Physical Exam  Constitutional: She is oriented to person, place, and time. She appears well-developed and well-nourished. She appears distressed.  HENT:  Head: Normocephalic.  Neck: Normal range of motion. Neck supple.  Cardiovascular: Normal rate, regular rhythm and normal heart sounds.   Respiratory: Effort normal and breath sounds normal.  GI: Soft. There is tenderness (generalized).  Genitourinary: No bleeding in the vagina. Vaginal discharge (mucusy) found.  Musculoskeletal: Normal range of motion. She exhibits edema (3+ pedal edema).  Neurological: She is alert and oriented to person, place, and time.  Skin: Skin is warm and dry.   Dilation: 1.5 Effacement (%): 50 Cervical Position: Posterior Exam by:: Reina Fuse, CNM  FHR 130's, +accels Toco - irregular  MAU Course  Procedures Results for orders placed or performed during the hospital encounter of 06/27/14 (from the past 24 hour(s))  Urinalysis, Routine w reflex microscopic  Status: Abnormal   Collection Time: 06/27/14  8:35 PM  Result Value Ref Range   Color, Urine YELLOW YELLOW   APPearance HAZY (A) CLEAR   Specific Gravity, Urine >1.030 (H) 1.005 - 1.030   pH 6.0 5.0 - 8.0   Glucose, UA NEGATIVE NEGATIVE mg/dL   Hgb urine dipstick NEGATIVE NEGATIVE   Bilirubin Urine NEGATIVE NEGATIVE   Ketones, ur 15 (A) NEGATIVE mg/dL   Protein, ur NEGATIVE NEGATIVE mg/dL   Urobilinogen, UA 0.2 0.0 - 1.0 mg/dL   Nitrite NEGATIVE NEGATIVE   Leukocytes, UA SMALL (A) NEGATIVE   Urine microscopic-add on     Status: Abnormal   Collection Time: 06/27/14  8:35 PM  Result Value Ref Range   Squamous Epithelial / LPF FEW (A) RARE   WBC, UA 0-2 <3 WBC/hpf   RBC / HPF 0-2 <3 RBC/hpf   Bacteria, UA RARE RARE   Urine-Other MUCOUS PRESENT   Protein / creatinine ratio, urine     Status: None   Collection Time: 06/27/14  8:35 PM  Result Value Ref Range   Creatinine, Urine 348.04 mg/dL   Total Protein, Urine 36.4 mg/dL   Protein Creatinine Ratio 0.10 0.00 - 0.15  CBC     Status: Abnormal   Collection Time: 06/27/14  9:25 PM  Result Value Ref Range   WBC 9.4 4.0 - 10.5 K/uL   RBC 3.54 (L) 3.87 - 5.11 MIL/uL   Hemoglobin 7.8 (L) 12.0 - 15.0 g/dL   HCT 24.9 (L) 36.0 - 46.0 %   MCV 70.3 (L) 78.0 - 100.0 fL   MCH 22.0 (L) 26.0 - 34.0 pg   MCHC 31.3 30.0 - 36.0 g/dL   RDW 18.2 (H) 11.5 - 15.5 %   Platelets 292 150 - 400 K/uL  Comprehensive metabolic panel     Status: Abnormal   Collection Time: 06/27/14  9:25 PM  Result Value Ref Range   Sodium 137 137 - 147 mEq/L   Potassium 3.7 3.7 - 5.3 mEq/L   Chloride 103 96 - 112 mEq/L   CO2 22 19 - 32 mEq/L   Glucose, Bld 108 (H) 70 - 99 mg/dL   BUN 9 6 - 23 mg/dL   Creatinine, Ser 0.74 0.50 - 1.10 mg/dL   Calcium 8.9 8.4 - 10.5 mg/dL   Total Protein 6.2 6.0 - 8.3 g/dL   Albumin 2.4 (L) 3.5 - 5.2 g/dL   AST 16 0 - 37 U/L   ALT 9 0 - 35 U/L   Alkaline Phosphatase 148 (H) 39 - 117 U/L   Total Bilirubin <0.2 (L) 0.3 - 1.2 mg/dL   GFR calc non Af Amer >90 >90 mL/min   GFR calc Af Amer >90 >90 mL/min   Anion gap 12 5 - 15   Consulted with Dr. Philis Pique > reviewed HPI/exam/labs/vitals/OB history > obtain orthostatics, if wnl discharge home with RX for iron and follow-up in office.   Assessment and Plan  28 yo G4P2012 at [id number] wks IUP Anemia Category I FHR Tracing  Plan: Discharge to home RX Ferrous sulfate 325 mg i po BID Follow-up in office next week [facility] N 06/27/2014, 9:10 PM

## 2014-06-27 NOTE — MAU Note (Signed)
Was at work and got to point could not stand or walk. Have bad h/a, contractions and hurt in lower back and stomach. Denies LOF or bleeding .

## 2014-06-28 DIAGNOSIS — O99019 Anemia complicating pregnancy, unspecified trimester: Secondary | ICD-10-CM

## 2014-06-28 MED ORDER — FERROUS SULFATE 325 (65 FE) MG PO TABS: 325.0000 mg | ORAL_TABLET | Freq: Two times a day (BID) | ORAL | Status: DC

## 2014-07-13 ENCOUNTER — Inpatient Hospital Stay (HOSPITAL_COMMUNITY): Payer: Medicaid Other

## 2014-07-13 ENCOUNTER — Inpatient Hospital Stay (HOSPITAL_COMMUNITY)
Admission: AD | Admit: 2014-07-13 | Discharge: 2014-07-16 | DRG: 774 | Disposition: A | Discharge status: Home / Self Care | Payer: Medicaid Other | Source: Ambulatory Visit | Attending: Obstetrics and Gynecology | Admitting: Obstetrics and Gynecology

## 2014-07-13 ENCOUNTER — Encounter (HOSPITAL_COMMUNITY): Payer: Self-pay

## 2014-07-13 DIAGNOSIS — O9812 Syphilis complicating childbirth: Principal | ICD-10-CM | POA: Diagnosis present

## 2014-07-13 DIAGNOSIS — A599 Trichomoniasis, unspecified: Secondary | ICD-10-CM | POA: Diagnosis present

## 2014-07-13 DIAGNOSIS — O139 Gestational [pregnancy-induced] hypertension without significant proteinuria, unspecified trimester: Secondary | ICD-10-CM | POA: Diagnosis present

## 2014-07-13 DIAGNOSIS — Z3689 Encounter for other specified antenatal screening: Secondary | ICD-10-CM | POA: Insufficient documentation

## 2014-07-13 DIAGNOSIS — O9902 Anemia complicating childbirth: Secondary | ICD-10-CM | POA: Diagnosis present

## 2014-07-13 DIAGNOSIS — D649 Anemia, unspecified: Secondary | ICD-10-CM | POA: Diagnosis present

## 2014-07-13 DIAGNOSIS — O133 Gestational [pregnancy-induced] hypertension without significant proteinuria, third trimester: Secondary | ICD-10-CM

## 2014-07-13 DIAGNOSIS — Z3A39 39 weeks gestation of pregnancy: Secondary | ICD-10-CM | POA: Diagnosis present

## 2014-07-13 LAB — PROTEIN / CREATININE RATIO, URINE
Creatinine, Urine: 247.99 mg/dL
Protein Creatinine Ratio: 0.12 (ref 0.00–0.15)
Total Protein, Urine: 29 mg/dL

## 2014-07-13 LAB — CBC
HCT: 24.9 % — ABNORMAL LOW (ref 36.0–46.0)
Hemoglobin: 7.7 g/dL — ABNORMAL LOW (ref 12.0–15.0)
MCH: 21.3 pg — ABNORMAL LOW (ref 26.0–34.0)
MCHC: 30.9 g/dL (ref 30.0–36.0)
MCV: 68.8 fL — ABNORMAL LOW (ref 78.0–100.0)
Platelets: 311 10*3/uL (ref 150–400)
RBC: 3.62 MIL/uL — AB (ref 3.87–5.11)
RDW: 19 % — ABNORMAL HIGH (ref 11.5–15.5)
WBC: 8.5 10*3/uL (ref 4.0–10.5)

## 2014-07-13 LAB — COMPREHENSIVE METABOLIC PANEL
ALBUMIN: 2.4 g/dL — AB (ref 3.5–5.2)
ALT: 7 U/L (ref 0–35)
ANION GAP: 12 (ref 5–15)
AST: 15 U/L (ref 0–37)
Alkaline Phosphatase: 167 U/L — ABNORMAL HIGH (ref 39–117)
BUN: 12 mg/dL (ref 6–23)
CHLORIDE: 103 meq/L (ref 96–112)
CO2: 23 meq/L (ref 19–32)
Calcium: 9 mg/dL (ref 8.4–10.5)
Creatinine, Ser: 0.76 mg/dL (ref 0.50–1.10)
GFR calc Af Amer: >90 mL/min (ref >90)
Glucose, Bld: 90 mg/dL (ref 70–99)
POTASSIUM: 4.3 meq/L (ref 3.7–5.3)
Sodium: 138 mEq/L (ref 137–147)
Total Protein: 6.3 g/dL (ref 6.0–8.3)

## 2014-07-13 LAB — URINALYSIS, DIPSTICK ONLY
BILIRUBIN URINE: NEGATIVE
Glucose, UA: NEGATIVE mg/dL
HGB URINE DIPSTICK: NEGATIVE
Ketones, ur: 15 mg/dL — AB
Nitrite: NEGATIVE
Protein, ur: NEGATIVE mg/dL
Specific Gravity, Urine: 1.02 (ref 1.005–1.030)
UROBILINOGEN UA: 0.2 mg/dL (ref 0.0–1.0)
pH: 7 (ref 5.0–8.0)

## 2014-07-13 MED ORDER — LACTATED RINGERS IV SOLN
INTRAVENOUS | Status: DC
  Administered 2014-07-14: 01:00:00 via INTRAVENOUS

## 2014-07-13 NOTE — MAU Provider Note (Signed)
History     CSN: [id number]  Arrival date and time: 07/13/14 1062   First Provider Initiated Contact with Patient 07/13/14 2104      No chief complaint on file.  HPI  Amber Sanders is a 28 y.o. a [id number] at [redacted]w[redacted]d who presents today with abdominal pain and pressure. She was found to have elevated blood pressure at the time of evaluation. She states that her blood pressure was elevated at her last visit on Friday. She states that she had high blood pressure with at least one of her other pregnancies and states that she thinks she was induced because of it. She denies any HA or visual disturbances. She does report some RUQ pain x 2 days. She states that the fetus has been active. She denies any VB or LOF.   Past Medical History  Diagnosis Date  . Sinus headache   . Hypertension   . Trichomonas   . Chlamydia   . Anemia   . Syphilis     Past Surgical History  Procedure Laterality Date  . Dilation and evacuation  07/16/2012    Procedure: DILATATION AND EVACUATION;  Surgeon: Osborne Oman, MD;  Location: [facility];  Service: Gynecology;  Laterality: N/A;  . Abscess drainage      arms    Family History  Problem Relation Age of Onset  . Cancer Father     History  Substance Use Topics  . Smoking status: Never Smoker   . Smokeless tobacco: Never Used  . Alcohol Use: No    Allergies: No Known Allergies  Prescriptions prior to admission  Medication Sig Dispense Refill Last Dose  . acetaminophen (TYLENOL) 500 MG tablet Take 1,000 mg by mouth every 6 (six) hours as needed for mild pain.   Past Week at Unknown time  . ferrous sulfate (FERROUSUL) 325 (65 FE) MG tablet Take 1 tablet (325 mg total) by mouth 2 (two) times daily. 60 tablet 1 07/13/2014 at Unknown time  . Prenatal Vit-Fe Fumarate-FA (PRENATAL MULTIVITAMIN) TABS tablet Take 1 tablet by mouth daily.    07/13/2014 at Unknown time  . cyclobenzaprine (FLEXERIL) 5 MG tablet Take 1 tablet (5 mg total) by mouth 3 (three)  times daily as needed for muscle spasms. (Patient not taking: Reported on 06/27/2014) 30 tablet 0 06/01/2014    ROS Physical Exam   Blood pressure 146/84, pulse 81, temperature 98.3 F (36.8 C), temperature source Oral, resp. rate 18, last menstrual period 10/12/2013, unknown if currently breastfeeding.  Physical Exam  Nursing note and vitals reviewed. Constitutional: She is oriented to person, place, and time. She appears well-developed and well-nourished. No distress.  Cardiovascular: Normal rate.   Respiratory: Effort normal.  GI: Soft. There is no tenderness. There is no rebound.  Neurological: She is alert and oriented to person, place, and time. She has normal reflexes.  1 beat of clonus on left foot.   Skin: Skin is warm.  Psychiatric: She has a normal mood and affect.    MAU Course  Procedures  Results for orders placed or performed during the hospital encounter of 07/13/14 (from the past 24 hour(s))  CBC     Status: Abnormal   Collection Time: 07/13/14  8:58 PM  Result Value Ref Range   WBC 8.5 4.0 - 10.5 K/uL   RBC 3.62 (L) 3.87 - 5.11 MIL/uL   Hemoglobin 7.7 (L) 12.0 - 15.0 g/dL   HCT 24.9 (L) 36.0 - 46.0 %   MCV 68.8 (L)  78.0 - 100.0 fL   MCH 21.3 (L) 26.0 - 34.0 pg   MCHC 30.9 30.0 - 36.0 g/dL   RDW 19.0 (H) 11.5 - 15.5 %   Platelets 311 150 - 400 K/uL  Comprehensive metabolic panel     Status: Abnormal   Collection Time: 07/13/14  8:58 PM  Result Value Ref Range   Sodium 138 137 - 147 mEq/L   Potassium 4.3 3.7 - 5.3 mEq/L   Chloride 103 96 - 112 mEq/L   CO2 23 19 - 32 mEq/L   Glucose, Bld 90 70 - 99 mg/dL   BUN 12 6 - 23 mg/dL   Creatinine, Ser 0.76 0.50 - 1.10 mg/dL   Calcium 9.0 8.4 - 10.5 mg/dL   Total Protein 6.3 6.0 - 8.3 g/dL   Albumin 2.4 (L) 3.5 - 5.2 g/dL   AST 15 0 - 37 U/L   ALT 7 0 - 35 U/L   Alkaline Phosphatase 167 (H) 39 - 117 U/L   Total Bilirubin <0.2 (L) 0.3 - 1.2 mg/dL   GFR calc non Af Amer >90 >90 mL/min   GFR calc Af Amer >90  >90 mL/min   Anion gap 12 5 - 15  Protein / creatinine ratio, urine     Status: None   Collection Time: 07/13/14  9:50 PM  Result Value Ref Range   Creatinine, Urine 247.99 mg/dL   Total Protein, Urine 29 mg/dL   Protein Creatinine Ratio 0.12 0.00 - 0.15  Urinalysis, dipstick only     Status: Abnormal   Collection Time: 07/13/14  9:50 PM  Result Value Ref Range   Specific Gravity, Urine 1.020 1.005 - 1.030   pH 7.0 5.0 - 8.0   Glucose, UA NEGATIVE NEGATIVE mg/dL   Hgb urine dipstick NEGATIVE NEGATIVE   Bilirubin Urine NEGATIVE NEGATIVE   Ketones, ur 15 (A) NEGATIVE mg/dL   Protein, ur NEGATIVE NEGATIVE mg/dL   Urobilinogen, UA 0.2 0.0 - 1.0 mg/dL   Nitrite NEGATIVE NEGATIVE   Leukocytes, UA TRACE (A) NEGATIVE   2320: D/W Dr. Rogue Bussing, admission orders obtained.   Assessment and Plan   1. Pregnancy induced hypertension, third trimester    Admit to labor and delivery Routine orders Will [place] to verify presentation Pitocin 2x2  Mathis Bud 07/13/2014, 9:07 PM

## 2014-07-14 ENCOUNTER — Encounter (HOSPITAL_COMMUNITY): Payer: Self-pay

## 2014-07-14 DIAGNOSIS — Z3A39 39 weeks gestation of pregnancy: Secondary | ICD-10-CM | POA: Insufficient documentation

## 2014-07-14 DIAGNOSIS — O139 Gestational [pregnancy-induced] hypertension without significant proteinuria, unspecified trimester: Secondary | ICD-10-CM | POA: Diagnosis present

## 2014-07-14 DIAGNOSIS — A599 Trichomoniasis, unspecified: Secondary | ICD-10-CM | POA: Diagnosis present

## 2014-07-14 DIAGNOSIS — O9902 Anemia complicating childbirth: Secondary | ICD-10-CM | POA: Diagnosis present

## 2014-07-14 DIAGNOSIS — O9812 Syphilis complicating childbirth: Secondary | ICD-10-CM | POA: Diagnosis present

## 2014-07-14 DIAGNOSIS — Z3689 Encounter for other specified antenatal screening: Secondary | ICD-10-CM | POA: Insufficient documentation

## 2014-07-14 DIAGNOSIS — Z3483 Encounter for supervision of other normal pregnancy, third trimester: Secondary | ICD-10-CM | POA: Diagnosis present

## 2014-07-14 DIAGNOSIS — D649 Anemia, unspecified: Secondary | ICD-10-CM | POA: Diagnosis present

## 2014-07-14 HISTORY — DX: Gestational (pregnancy-induced) hypertension without significant proteinuria, unspecified trimester: O13.9

## 2014-07-14 LAB — CBC
HCT: 25.5 % — ABNORMAL LOW (ref 36.0–46.0)
Hemoglobin: 7.8 g/dL — ABNORMAL LOW (ref 12.0–15.0)
MCH: 21 pg — ABNORMAL LOW (ref 26.0–34.0)
MCHC: 30.6 g/dL (ref 30.0–36.0)
MCV: 68.5 fL — ABNORMAL LOW (ref 78.0–100.0)
Platelets: 338 10*3/uL (ref 150–400)
RBC: 3.72 MIL/uL — AB (ref 3.87–5.11)
RDW: 19 % — AB (ref 11.5–15.5)
WBC: 9.7 10*3/uL (ref 4.0–10.5)

## 2014-07-14 LAB — PREPARE RBC (CROSSMATCH)

## 2014-07-14 LAB — FLUORESCENT TREPONEMAL AB(FTA)-IGG-BLD: Fluorescent Treponemal Ab, IgG: REACTIVE — AB

## 2014-07-14 LAB — RPR TITER: RPR Titer: 1:8 {titer} — AB

## 2014-07-14 LAB — HIV ANTIBODY (ROUTINE TESTING W REFLEX): HIV 1&2 Ab, 4th Generation: NONREACTIVE

## 2014-07-14 LAB — RPR: RPR Ser Ql: REACTIVE — AB

## 2014-07-14 MED ORDER — LIDOCAINE HCL (PF) 1 % IJ SOLN
30.0000 mL | INTRAMUSCULAR | Status: AC | PRN
  Administered 2014-07-14: 30 mL via SUBCUTANEOUS
  Filled 2014-07-14: qty 30

## 2014-07-14 MED ORDER — INFLUENZA VAC SPLIT QUAD 0.5 ML IM SUSY
0.5000 mL | PREFILLED_SYRINGE | INTRAMUSCULAR | Status: AC
  Administered 2014-07-14: 0.5 mL via INTRAMUSCULAR

## 2014-07-14 MED ORDER — OXYTOCIN 40 UNITS IN LACTATED RINGERS INFUSION - SIMPLE MED
62.5000 mL/h | INTRAVENOUS | Status: DC
  Administered 2014-07-14: 62.5 mL/h via INTRAVENOUS
  Filled 2014-07-14: qty 1000

## 2014-07-14 MED ORDER — BENZOCAINE-MENTHOL 20-0.5 % EX AERO: 1.0000 "application " | INHALATION_SPRAY | CUTANEOUS | Status: DC | PRN

## 2014-07-14 MED ORDER — ACETAMINOPHEN 325 MG PO TABS: 650.0000 mg | ORAL_TABLET | ORAL | Status: DC | PRN

## 2014-07-14 MED ORDER — IBUPROFEN 600 MG PO TABS
600.0000 mg | ORAL_TABLET | Freq: Four times a day (QID) | ORAL | Status: DC
  Administered 2014-07-14 – 2014-07-16 (×9): 600 mg via ORAL
  Filled 2014-07-14 (×9): qty 1

## 2014-07-14 MED ORDER — LANOLIN HYDROUS EX OINT
TOPICAL_OINTMENT | CUTANEOUS | Status: DC | PRN
Start: 2014-07-14 — End: 2014-07-16

## 2014-07-14 MED ORDER — PENICILLIN G BENZATHINE 1200000 UNIT/2ML IM SUSP
2.4000 10*6.[IU] | Freq: Once | INTRAMUSCULAR | Status: AC
  Administered 2014-07-14: 2.4 10*6.[IU] via INTRAMUSCULAR
  Filled 2014-07-14: qty 4

## 2014-07-14 MED ORDER — ZOLPIDEM TARTRATE 5 MG PO TABS
5.0000 mg | ORAL_TABLET | Freq: Every evening | ORAL | Status: DC | PRN
Start: 2014-07-14 — End: 2014-07-16

## 2014-07-14 MED ORDER — OXYCODONE-ACETAMINOPHEN 5-325 MG PO TABS: 2.0000 | ORAL_TABLET | ORAL | Status: DC | PRN

## 2014-07-14 MED ORDER — DIPHENHYDRAMINE HCL 25 MG PO CAPS: 25.0000 mg | ORAL_CAPSULE | Freq: Four times a day (QID) | ORAL | Status: DC | PRN

## 2014-07-14 MED ORDER — ONDANSETRON HCL 4 MG/2ML IJ SOLN: 4.0000 mg | Freq: Four times a day (QID) | INTRAMUSCULAR | Status: DC | PRN

## 2014-07-14 MED ORDER — OXYTOCIN 40 UNITS IN LACTATED RINGERS INFUSION - SIMPLE MED
1.0000 m[IU]/min | INTRAVENOUS | Status: DC
  Administered 2014-07-14: 2 m[IU]/min via INTRAVENOUS

## 2014-07-14 MED ORDER — ONDANSETRON HCL 4 MG PO TABS: 4.0000 mg | ORAL_TABLET | ORAL | Status: DC | PRN

## 2014-07-14 MED ORDER — OXYCODONE-ACETAMINOPHEN 5-325 MG PO TABS
1.0000 | ORAL_TABLET | ORAL | Status: DC | PRN
  Administered 2014-07-14: 1 via ORAL
  Filled 2014-07-14: qty 1

## 2014-07-14 MED ORDER — WITCH HAZEL-GLYCERIN EX PADS: 1.0000 "application " | MEDICATED_PAD | CUTANEOUS | Status: DC | PRN

## 2014-07-14 MED ORDER — DIBUCAINE 1 % RE OINT: 1.0000 "application " | TOPICAL_OINTMENT | RECTAL | Status: DC | PRN

## 2014-07-14 MED ORDER — ONDANSETRON HCL 4 MG/2ML IJ SOLN: 4.0000 mg | INTRAMUSCULAR | Status: DC | PRN

## 2014-07-14 MED ORDER — CITRIC ACID-SODIUM CITRATE 334-500 MG/5ML PO SOLN: 30.0000 mL | ORAL | Status: DC | PRN

## 2014-07-14 MED ORDER — SIMETHICONE 80 MG PO CHEW: 80.0000 mg | CHEWABLE_TABLET | ORAL | Status: DC | PRN

## 2014-07-14 MED ORDER — PRENATAL MULTIVITAMIN CH
1.0000 | ORAL_TABLET | Freq: Every day | ORAL | Status: DC
  Administered 2014-07-14 – 2014-07-15 (×2): 1 via ORAL
  Filled 2014-07-14 (×2): qty 1

## 2014-07-14 MED ORDER — LACTATED RINGERS IV SOLN: 500.0000 mL | INTRAVENOUS | Status: DC | PRN

## 2014-07-14 MED ORDER — TERBUTALINE SULFATE 1 MG/ML IJ SOLN: 0.2500 mg | Freq: Once | INTRAMUSCULAR | Status: DC | PRN

## 2014-07-14 MED ORDER — SENNOSIDES-DOCUSATE SODIUM 8.6-50 MG PO TABS
2.0000 | ORAL_TABLET | ORAL | Status: DC
  Administered 2014-07-14 – 2014-07-16 (×2): 2 via ORAL
  Filled 2014-07-14 (×2): qty 2

## 2014-07-14 MED ORDER — OXYTOCIN BOLUS FROM INFUSION
500.0000 mL | INTRAVENOUS | Status: DC
  Administered 2014-07-14: 500 mL via INTRAVENOUS

## 2014-07-14 MED ORDER — TETANUS-DIPHTH-ACELL PERTUSSIS 5-2.5-18.5 LF-MCG/0.5 IM SUSP
0.5000 mL | Freq: Once | INTRAMUSCULAR | Status: DC
Start: 2014-07-15 — End: 2014-07-16

## 2014-07-14 MED ORDER — OXYCODONE-ACETAMINOPHEN 5-325 MG PO TABS: 1.0000 | ORAL_TABLET | ORAL | Status: DC | PRN

## 2014-07-14 NOTE — Lactation Note (Signed)
This note was copied from the chart of [place]. Lactation Consultation Note     Initial consult with this mom of a term baby, transferred to NICU for antibiotics due to mom having a positive RPR. I set up a DEP for mom, showed her how to set premie setting, and how to do hand expression. Mom was able to pump 2 mls of colostrum. She has breast fed her other 2 children. Mom is going to breast feed [name] in the nICU when she can. Information faxed to [facility] for mom. Mom iwll need a [facility] loaner on discharge. Mom knows to call for questions/concerns. Lactation services also reviewed with mom.  Patient Name: Amber Sanders [id number] Date: 07/14/2014 Reason for consult: Initial assessment   Maternal Data Formula Feeding for Exclusion:  (baby in NICU) Reason for exclusion: Mother's choice to formula and breast feed on admission Has patient been taught Hand Expression?: Yes Does the patient have breastfeeding experience prior to this delivery?: Yes  Feeding    LATCH Score/Interventions                      Lactation Tools Discussed/Used Tools: Pump Breast pump type: Double-Electric Breast Pump WIC Program: Yes (info faxed to [facility] for DEP - mom will need a loaner pump at her dishcarge to home) Pump Review: Setup, frequency, and cleaning;Milk Storage;Other (comment) (premie setting, hand expression, review of NICU booklet) Initiated by:: c Jmya Uliano rn ibclc, at 26 hours of age, after baby transferred to NICU Date initiated:: 07/14/14   Consult Status Consult Status: Follow-up Date: 07/15/14 Follow-up type: In-patient    Tonna Corner 07/14/2014, 4:32 PM

## 2014-07-14 NOTE — H&P (Signed)
28 y.o. [redacted]w[redacted]d  [id number] comes in c/o ctx.  Otherwise has good fetal movement and no bleeding.  BPs noted to be mild range.  Denies HAs, vision changes, RUQ pain.    Past Medical History  Diagnosis Date  . Sinus headache   . Hypertension   . Trichomonas   . Chlamydia   . Anemia   . Syphilis     Past Surgical History  Procedure Laterality Date  . Dilation and evacuation  07/16/2012    Procedure: DILATATION AND EVACUATION;  Surgeon: Osborne Oman, MD;  Location: [facility];  Service: Gynecology;  Laterality: N/A;  . Abscess drainage      arms    OB History  Gravida Para Term Preterm AB SAB TAB Ectopic Multiple Living  4 2 2  1 1    2     # Outcome Date GA Lbr Len/2nd Weight Sex Delivery Anes PTL Lv  4 Current           3 SAB           2 Term      Vag-Spont   Y  1 Term      Vag-Spont   Y      History   Social History  . Marital Status: Single    Spouse Name: N/A    Number of Children: N/A  . Years of Education: N/A   Occupational History  . Not on file.   Social History Main Topics  . Smoking status: Never Smoker   . Smokeless tobacco: Never Used  . Alcohol Use: No  . Drug Use: No  . Sexual Activity: Yes    Birth Control/ Protection: None   Other Topics Concern  . Not on file   Social History Narrative   Review of patient's allergies indicates no known allergies.    Prenatal Transfer Tool  Maternal Diabetes: No Genetic Screening: Normal Maternal Ultrasounds/Referrals: Normal Fetal Ultrasounds or other Referrals:  None Maternal Substance Abuse:  No Significant Maternal Medications:  None Significant Maternal Lab Results: Lab values include: Group B Strep negative  Other PNC: uncomplicated.    Filed Vitals:   07/14/14 0430  BP: 140/84  Pulse: 83  Temp:   Resp: 22     Lungs/Cor:  NAD Abdomen:  soft, gravid Ex:  no cords, erythema SVE:  9/90/0 FHTs:  140, good STV, NST R Toco:  2-3  A/P   Admit for laborand elevated BPs  GBS Neg  PIH labs  wnl  Amber Sanders

## 2014-07-14 NOTE — Progress Notes (Signed)
Lab called today. Pt with + RPR titer 1:8, and + FTAABS. Pt was txd in 2014 by the health dept. She had a negative RPR on initial OB visit and again with OGTT. She states that the last time she had sex was in August. Baby is in NICU having a work up. Will tx pt with PCN 2.26mil units.IM.

## 2014-07-14 NOTE — Progress Notes (Signed)
UR chart review completed.  

## 2014-07-15 LAB — CBC
HEMATOCRIT: 25.6 % — AB (ref 36.0–46.0)
Hemoglobin: 7.8 g/dL — ABNORMAL LOW (ref 12.0–15.0)
MCH: 20.9 pg — ABNORMAL LOW (ref 26.0–34.0)
MCHC: 30.5 g/dL (ref 30.0–36.0)
MCV: 68.4 fL — ABNORMAL LOW (ref 78.0–100.0)
PLATELETS: 292 10*3/uL (ref 150–400)
RBC: 3.74 MIL/uL — ABNORMAL LOW (ref 3.87–5.11)
RDW: 19.4 % — AB (ref 11.5–15.5)
WBC: 10.5 10*3/uL (ref 4.0–10.5)

## 2014-07-15 NOTE — Progress Notes (Signed)
Post Partum Day 1 Subjective: Patient reports minimal pain, bleeding appropriate, they be doing well in the NICU.  Patient denies any lightheadedness or dizziness with ambulation  Objective: Blood pressure 139/81, pulse 87, temperature 98.3 F (36.8 C), temperature source Oral, resp. rate 18, height 5\' 6"  (1.676 m), weight 136.986 kg (302 lb), last menstrual period 10/12/2013, SpO2 100 %, unknown if currently breastfeeding.  Physical Exam:  General: alert, cooperative and appears stated age 28: appropriate Uterine Fundus: firm    Recent Labs  07/14/14 0120 07/15/14 0715  HGB 7.8* 7.8*  HCT 25.5* 25.6*    Assessment/Plan: 1)  The patient's RPR was positive on admission with a titer of 1 to 8. 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 The patient has a history of syphilis  That was diagnosed on  October 21, 2013. At that time she had an RPR titer of 1 to 16, T. Pallidun Antibody was positive.  She states that she received penicillin with one injection weekly for three weeks. Trichomoniasis was diagnosed also.  She denies sexual activity since August 2015. -  RPR 01/06/2014 non-reactive @ 12+4 weeks -  RPR 05/08/2014 non-reactive @ 29+4 weeks Syphilis therapy initiated for patient. Received Pen G 2.4 million units IM on 07/14/2014 @ 1940. Pt will need an additional 2 treatments Q week after discharge Baby in NICU for eval, s/p LP. Serum RPR negative CSF VDRL pending, culture pending. Being treated with PCN until culture results & CSF VDRL return. If negative then baby will be D/Cd home and abx therapy stopped. 2) PP anemia: asymptomatic. Pts Hgb was 8.8 at 29 weeks   LOS: 2 days   Amber Sanders. 07/15/2014, 8:24 PM

## 2014-07-15 NOTE — Lactation Note (Signed)
This note was copied from the chart of [place]. Lactation Consultation Note Follow up at 36 hours of age.  Mom requests colostrum cups to bring to baby [name] in the NICU.  Mom reports doing hand expression after pumping and tries to pump every 3 hours. Mom has had a chance to latch him a few times in the NICU and he is taking formula bottle feedings also.  Mom denies needs at this time.  Mom to call for assist as needed.    Patient Name: Amber Sanders [id number] Date: 07/15/2014 Reason for consult: Follow-up assessment;NICU baby   Maternal Data    Feeding Feeding Type: Formula Length of feed: 20 min  LATCH Score/Interventions                      Lactation Tools Discussed/Used     Consult Status Consult Status: PRN    Justice Britain 07/15/2014, 5:02 PM

## 2014-07-16 ENCOUNTER — Ambulatory Visit: Payer: Self-pay

## 2014-07-16 NOTE — Discharge Summary (Signed)
Obstetric Discharge Summary Reason for Admission: onset of labor Prenatal Procedures: ultrasound Intrapartum Procedures: spontaneous vaginal delivery Postpartum Procedures: none Complications-Operative and Postpartum: none HEMOGLOBIN  Date Value Ref Range Status  07/15/2014 7.8* 12.0 - 15.0 g/dL Final   HCT  Date Value Ref Range Status  07/15/2014 25.6* 36.0 - 46.0 % Final    Physical Exam:  General: alert Lochia: appropriate Uterine Fundus: firm  Discharge Diagnoses: Term Pregnancy-delivered and syphyllis  Discharge Information: Date: 07/16/2014 Activity: pelvic rest Diet: routine Medications: PNV and Ibuprofen Condition: stable Instructions: refer to practice specific booklet Discharge to: home Follow-up Information    Follow up with CALLAHAN, SIDNEY, DO. Schedule an appointment as soon as possible for a visit in 1 month.   Specialty:  Obstetrics and Gynecology   Contact information:   [address] [place] [address] [phone]       Newborn Data: Live born female  Birth Weight: 8 lb 8.3 oz (3864 g) APGAR: 8, 9  Home with in NICU.  Finnlee Silvernail E 07/16/2014, 9:08 AM

## 2014-07-16 NOTE — Lactation Note (Signed)
This note was copied from the chart of [name]. Lactation Consultation Note  Patient Name: Amber Sanders [id number] Date: 07/16/2014 Reason for consult: Follow-up assessment;NICU baby  [place] visited mom 2nd time and reviewed pump paperwork. Received $30.00 and explained the DEBP to mom. Mom also has all parts to the pump for D/C.    Maternal Data    Feeding Feeding Type: Formula  LATCH Score/Interventions                Intervention(s): Breastfeeding basics reviewed     Lactation Tools Discussed/Used     Consult Status Consult Status: Follow-up Date: 07/16/14 Follow-up type: In-patient    Myer Haff 07/16/2014, 11:47 AM

## 2014-07-16 NOTE — Lactation Note (Signed)
This note was copied from the chart of [place]. Lactation Consultation Note  Patient Name: Boy Venezia Sargeant [id number] Date: 07/16/2014 Reason for consult: Follow-up assessment;NICU baby  Per mom the baby has been to breast ,sometimes sluggish and once during the night did better. LC reviewed sore nipple and engorgement prevention and tx. Per mom nipples are tender , no breakdown , and using comfort gels which are helping .  Referred to the Baby and me booklet. Mom plans to obtain [facility] Loaner pump from [facility] today , Paper work provided and [facility] asked mom to call [place] when $ available. Mother informed of post-discharge support and given phone number to the lactation department, including services for phone call assistance; out-patient appointments; and breastfeeding support group. List of other breastfeeding resources in the community given in the handout. Encouraged mother to call for problems or concerns related to breastfeeding.    Maternal Data    Feeding Feeding Type: Breast Fed  LATCH Score/Interventions                Intervention(s): Breastfeeding basics reviewed     Lactation Tools Discussed/Used     Consult Status Consult Status: Follow-up Date: 07/16/14 Follow-up type: In-patient    Myer Haff 07/16/2014, 8:58 AM

## 2014-07-16 NOTE — Progress Notes (Signed)
PPD#2 Pt without complaints. Baby still being worked up for [employer].  VSSAF IMP/ stable Plan/ Will discharge.            Will return in a week for PCN

## 2014-07-18 LAB — TYPE AND SCREEN
ABO/RH(D): B POS
ANTIBODY SCREEN: NEGATIVE
UNIT DIVISION: 0
Unit division: 0
Unit division: 0

## 2014-07-21 ENCOUNTER — Inpatient Hospital Stay (HOSPITAL_COMMUNITY)
Admission: AD | Admit: 2014-07-21 | Discharge: 2014-07-21 | Disposition: A | Discharge status: Home / Self Care | Payer: Medicaid Other | Source: Ambulatory Visit | Attending: Obstetrics & Gynecology | Admitting: Obstetrics & Gynecology

## 2014-07-21 DIAGNOSIS — A510 Primary genital syphilis: Secondary | ICD-10-CM | POA: Insufficient documentation

## 2014-07-21 MED ORDER — PENICILLIN G BENZATHINE 1200000 UNIT/2ML IM SUSP
2.4000 10*6.[IU] | Freq: Once | INTRAMUSCULAR | Status: AC
  Administered 2014-07-21: 2.4 10*6.[IU] via INTRAMUSCULAR
  Filled 2014-07-21: qty 4

## 2014-07-21 NOTE — MAU Note (Signed)
Patient to MAU for PCN injection for treatment of syphilis. Was being treated prior to delivery but needs 2 additional weekly doses. Telephone order received from Dr. Rogue Bussing.

## 2015-02-18 ENCOUNTER — Encounter (HOSPITAL_COMMUNITY): Payer: Self-pay | Admitting: Emergency Medicine

## 2015-02-18 ENCOUNTER — Emergency Department (HOSPITAL_COMMUNITY)
Admission: EM | Admit: 2015-02-18 | Discharge: 2015-02-18 | Disposition: A | Discharge status: Home / Self Care | Payer: Medicaid Other | Attending: Emergency Medicine | Admitting: Emergency Medicine

## 2015-02-18 DIAGNOSIS — B9689 Other specified bacterial agents as the cause of diseases classified elsewhere: Secondary | ICD-10-CM | POA: Insufficient documentation

## 2015-02-18 DIAGNOSIS — H1089 Other conjunctivitis: Secondary | ICD-10-CM | POA: Insufficient documentation

## 2015-02-18 DIAGNOSIS — I10 Essential (primary) hypertension: Secondary | ICD-10-CM | POA: Insufficient documentation

## 2015-02-18 DIAGNOSIS — H109 Unspecified conjunctivitis: Secondary | ICD-10-CM

## 2015-02-18 DIAGNOSIS — D649 Anemia, unspecified: Secondary | ICD-10-CM | POA: Insufficient documentation

## 2015-02-18 DIAGNOSIS — Z79899 Other long term (current) drug therapy: Secondary | ICD-10-CM | POA: Insufficient documentation

## 2015-02-18 MED ORDER — POLYMYXIN B-TRIMETHOPRIM 10000-0.1 UNIT/ML-% OP SOLN: 2.0000 [drp] | OPHTHALMIC | Status: DC

## 2015-02-18 NOTE — ED Provider Notes (Signed)
CSN: [id number]     Arrival date & time 02/18/15  0959 History  This chart was scribed for non-physician practitioner, Hyman Bible, PA-C working with Leonard Schwartz, MD by Tula Nakayama, ED scribe. This patient was seen in room TR04C/TR04C and the patient's care was started at 11:20 AM   Chief Complaint  Patient presents with  . Eye Problem   The history is provided by the patient. No language interpreter was used.   HPI Comments: Amber Sanders is a 29 y.o. female who presents to the Emergency Department complaining of constant, mild, right eye redness that started yesterday. She states clear discharge and crusting of her right eye as associated symptoms. Pt does not wear contacts or glasses. Pt denies recent injuries to the eye or the presence of foreign bodies. She also denies similar symptoms in her left eye, eye pain and vision changes as associated symptoms.   Past Medical History  Diagnosis Date  . Sinus headache   . Hypertension   . Trichomonas   . Chlamydia   . Anemia   . Syphilis    Past Surgical History  Procedure Laterality Date  . Dilation and evacuation  07/16/2012    Procedure: DILATATION AND EVACUATION;  Surgeon: Osborne Oman, MD;  Location: [facility];  Service: Gynecology;  Laterality: N/A;  . Abscess drainage      arms   Family History  Problem Relation Age of Onset  . Cancer Father    History  Substance Use Topics  . Smoking status: Never Smoker   . Smokeless tobacco: Never Used  . Alcohol Use: No   OB History    Gravida Para Term Preterm AB TAB SAB Ectopic Multiple Living   4 3 3  1  1   0 2     Review of Systems  Eyes: Positive for discharge and redness. Negative for pain and visual disturbance.    Allergies  Review of patient's allergies indicates no known allergies.  Home Medications   Prior to Admission medications   Medication Sig Start Date End Date Taking? Authorizing Provider  ferrous sulfate (FERROUSUL) 325 (65 FE) MG tablet  Take 1 tablet (325 mg total) by mouth 2 (two) times daily. 06/28/14   Gwen Pounds, CNM  Prenatal Vit-Fe Fumarate-FA (PRENATAL MULTIVITAMIN) TABS tablet Take 1 tablet by mouth daily.     Historical Provider, MD   BP 124/73 mmHg  Pulse 75  Temp(Src) 98.5 F (36.9 C) (Oral)  Resp 16  SpO2 100%  LMP 01/13/2015 Physical Exam  Constitutional: She appears well-developed and well-nourished. No distress.  HENT:  Head: Normocephalic and atraumatic.  Eyes: Conjunctivae and EOM are normal. Pupils are equal, round, and reactive to light. Lids are everted and swept, no foreign bodies found.  Mild diffuse injection of the right eye; no pain with EOM  Neck: Neck supple. No tracheal deviation present.  Cardiovascular: Normal rate, regular rhythm and normal heart sounds.   Pulmonary/Chest: Effort normal and breath sounds normal. No respiratory distress.  Skin: Skin is warm and dry.  Psychiatric: She has a normal mood and affect. Her behavior is normal.  Nursing note and vitals reviewed.   ED Course  Procedures   DIAGNOSTIC STUDIES: Oxygen Saturation is 100% on RA, normal by my interpretation.    COORDINATION OF CARE: 11:25 AM Discussed treatment plan with pt which includes antibiotic eye drops. Pt agreed to plan.  Labs Review Labs Reviewed - No data to display  Imaging Review No results  found.   EKG Interpretation None      MDM   Final diagnoses:  None  Patient presents today with redness of her right eye.  She denies eye injury.  No eye pain or photophobia.  Sings and symptoms most consistent with Conjunctivitis, possibly bacterial.  Patient given Rx for antibiotic eye drops.  Patient stable for discharge.  Return precautions given.    I personally performed the services described in this documentation, which was scribed in my presence. The recorded information has been reviewed and is accurate.    Hyman Bible, PA-C 02/19/15 2010  Leonard Schwartz, MD 02/20/15 [phone]

## 2015-02-18 NOTE — ED Notes (Signed)
Pt. Stated, I started having some eye redness with some drainage yesterday morning, this morning it was crusty and red, I have a little irritation

## 2015-02-18 NOTE — Discharge Instructions (Signed)

## 2015-08-06 ENCOUNTER — Encounter (HOSPITAL_COMMUNITY): Payer: Self-pay | Admitting: Emergency Medicine

## 2015-08-06 ENCOUNTER — Emergency Department (HOSPITAL_COMMUNITY)
Admission: EM | Admit: 2015-08-06 | Discharge: 2015-08-06 | Disposition: A | Discharge status: Home / Self Care | Payer: Medicaid Other | Attending: Emergency Medicine | Admitting: Emergency Medicine

## 2015-08-06 ENCOUNTER — Emergency Department (HOSPITAL_COMMUNITY): Payer: Medicaid Other

## 2015-08-06 DIAGNOSIS — R69 Illness, unspecified: Secondary | ICD-10-CM

## 2015-08-06 DIAGNOSIS — J111 Influenza due to unidentified influenza virus with other respiratory manifestations: Secondary | ICD-10-CM | POA: Insufficient documentation

## 2015-08-06 DIAGNOSIS — D649 Anemia, unspecified: Secondary | ICD-10-CM | POA: Insufficient documentation

## 2015-08-06 DIAGNOSIS — Z8619 Personal history of other infectious and parasitic diseases: Secondary | ICD-10-CM | POA: Insufficient documentation

## 2015-08-06 DIAGNOSIS — Z792 Long term (current) use of antibiotics: Secondary | ICD-10-CM | POA: Insufficient documentation

## 2015-08-06 DIAGNOSIS — Z79899 Other long term (current) drug therapy: Secondary | ICD-10-CM | POA: Insufficient documentation

## 2015-08-06 DIAGNOSIS — I10 Essential (primary) hypertension: Secondary | ICD-10-CM | POA: Insufficient documentation

## 2015-08-06 MED ORDER — ONDANSETRON HCL 4 MG PO TABS: 4.0000 mg | ORAL_TABLET | Freq: Three times a day (TID) | ORAL | Status: DC | PRN

## 2015-08-06 MED ORDER — IBUPROFEN 800 MG PO TABS: 800.0000 mg | ORAL_TABLET | Freq: Three times a day (TID) | ORAL | Status: DC | PRN

## 2015-08-06 MED ORDER — ACETAMINOPHEN 325 MG PO TABS
650.0000 mg | ORAL_TABLET | Freq: Once | ORAL | Status: AC | PRN
  Administered 2015-08-06: 650 mg via ORAL
  Filled 2015-08-06: qty 2

## 2015-08-06 MED ORDER — ALBUTEROL SULFATE HFA 108 (90 BASE) MCG/ACT IN AERS
2.0000 | INHALATION_SPRAY | Freq: Once | RESPIRATORY_TRACT | Status: AC
  Administered 2015-08-06: 2 via RESPIRATORY_TRACT
  Filled 2015-08-06: qty 6.7

## 2015-08-06 MED ORDER — HYDROCODONE-HOMATROPINE 5-1.5 MG/5ML PO SYRP: 5.0000 mL | ORAL_SOLUTION | Freq: Four times a day (QID) | ORAL | Status: DC | PRN

## 2015-08-06 MED ORDER — AEROCHAMBER PLUS FLO-VU MEDIUM MISC
1.0000 | Freq: Once | Status: AC
Start: 2015-08-06 — End: 2015-08-06
  Administered 2015-08-06: 1

## 2015-08-06 MED ORDER — IBUPROFEN 800 MG PO TABS
800.0000 mg | ORAL_TABLET | Freq: Once | ORAL | Status: AC
  Administered 2015-08-06: 800 mg via ORAL
  Filled 2015-08-06: qty 1

## 2015-08-06 NOTE — Discharge Instructions (Signed)
Read the information below.  Use the prescribed medication as directed.  Please discuss all new medications with your pharmacist.  You may return to the Emergency Department at any time for worsening condition or any new symptoms that concern you.   If there is any possibility that you might be pregnant or if you are breastfeeding, please let your health care provider know and discuss this with the pharmacist to ensure medication safety.  If you develop high fevers that do not resolve with tylenol or ibuprofen, you have difficulty swallowing or breathing, or you are unable to tolerate fluids by mouth, return to the ER for a recheck.      Influenza, Adult Influenza (flu) is an infection in the mouth, nose, and throat (respiratory tract) caused by a virus. The flu can make you feel very ill. Influenza spreads easily from person to person (contagious).  HOME CARE   Only take medicines as told by your doctor.  Use a cool mist humidifier to make breathing easier.  Get plenty of rest until your fever goes away. This usually takes 3 to 4 days.  Drink enough fluids to keep your pee (urine) clear or pale yellow.  Cover your mouth and nose when you cough or sneeze.  Wash your hands well to avoid spreading the flu.  Stay home from work or school until your fever has been gone for at least 1 full day.  Get a flu shot every year. GET HELP RIGHT AWAY IF:   You have trouble breathing or feel short of breath.  Your skin or nails turn blue.  You have severe neck pain or stiffness.  You have a severe headache, facial pain, or earache.  Your fever gets worse or keeps coming back.  You feel sick to your stomach (nauseous), throw up (vomit), or have watery poop (diarrhea).  You have chest pain.  You have a deep cough that gets worse, or you cough up more thick spit (mucus). MAKE SURE YOU:   Understand these instructions.  Will watch your condition.  Will get help right away if you are not  doing well or get worse.   This information is not intended to replace advice given to you by your health care provider. Make sure you discuss any questions you have with your health care provider.   Document Released: 04/18/2008 Document Revised: 07/31/2014 Document Reviewed: 10/09/2011 Elsevier Interactive Patient Education 2016 Elsevier Inc.  Viral Infections A virus is a type of germ. Viruses can cause:  Minor sore throats.  Aches and pains.  Headaches.  Runny nose.  Rashes.  Watery eyes.  Tiredness.  Coughs.  Loss of appetite.  Feeling sick to your stomach (nausea).  Throwing up (vomiting).  Watery poop (diarrhea). HOME CARE   Only take medicines as told by your doctor.  Drink enough water and fluids to keep your pee (urine) clear or pale yellow. Sports drinks are a good choice.  Get plenty of rest and eat healthy. Soups and broths with crackers or rice are fine. GET HELP RIGHT AWAY IF:   You have a very bad headache.  You have shortness of breath.  You have chest pain or neck pain.  You have an unusual rash.  You cannot stop throwing up.  You have watery poop that does not stop.  You cannot keep fluids down.  You or your child has a temperature by mouth above 102 F (38.9 C), not controlled by medicine.  Your baby is older than  3 months with a rectal temperature of 102 F (38.9 C) or higher.  Your baby is 25 months old or younger with a rectal temperature of 100.4 F (38 C) or higher. MAKE SURE YOU:   Understand these instructions.  Will watch this condition.  Will get help right away if you are not doing well or get worse.   This information is not intended to replace advice given to you by your health care provider. Make sure you discuss any questions you have with your health care provider.   Document Released: 06/22/2008 Document Revised: 10/02/2011 Document Reviewed: 12/16/2014 Elsevier Interactive Patient Education 2016 [employer].    Emergency Department Resource Guide 1) Find a Doctor and Pay Out of Pocket Although you won't have to find out who is covered by your insurance plan, it is a good idea to ask around and get recommendations. You will then need to call the office and see if the doctor you have chosen will accept you as a new patient and what types of options they offer for patients who are self-pay. Some doctors offer discounts or will set up payment plans for their patients who do not have insurance, but you will need to ask so you aren't surprised when you get to your appointment.  2) Contact Your Local Health Department Not all health departments have doctors that can see patients for sick visits, but many do, so it is worth a call to see if yours does. If you don't know where your local health department is, you can check in your phone book. The CDC also has a tool to help you locate your state's health department, and many state websites also have listings of all of their local health departments.  3) Find a [facility] If your illness is not likely to be very severe or complicated, you may want to try a walk in clinic. These are popping up all over the country in pharmacies, drugstores, and shopping centers. They're usually staffed by nurse practitioners or physician assistants that have been trained to treat common illnesses and complaints. They're usually fairly quick and inexpensive. However, if you have serious medical issues or chronic medical problems, these are probably not your best option.  No Primary Care Doctor: - Call Health Connect at  [phone] - they can help you locate a primary care doctor that  accepts your insurance, provides certain services, etc. - Physician Referral Service- [phone]  Chronic Pain Problems: Organization         Address  Phone   Notes  [facility]  [phone] Patients need to be referred by their primary care doctor.   Medication  Assistance: Organization         Address  Phone   Notes  [facility] Medication [facility] [address] [place], [place] [address] [phone] --Must be a resident of [facility] -- Must have NO insurance coverage whatsoever (no Medicaid/ Medicare, etc.) -- The pt. MUST have a primary care doctor that directs their care regularly and follows them in the community   MedAssist  [phone]   [employer]  [phone]    Agencies that provide inexpensive medical care: Organization         Address  Phone   Notes  [place]  [phone]   [facility]    [phone]   [facility] [place],  [place] [address] [phone]   [place] [address] [place] [phone]   Planned Parenthood    [phone]   [facility]    [phone]   [place] and [place] [address], [place] Phone:  [phone], Fax:  [phone] Hours of Operation:  9 am - 6 pm, M-F.  Also accepts Medicaid/Medicare and self-pay.  [facility] for [place] [address], [address], [place] Phone: [phone], Fax: [phone]. Hours of Operation:  8:30 am - 5:30 pm, M-F.  Also accepts Medicaid and self-pay.  [facility] [place] [address], [place] Phone: [phone]   [place], [place], [place] [phone], Ext. [phone] Mondays & Thursdays: 7-9 AM.  First 15 patients are seen on a first come, first serve basis.    [place] Providers:  Organization         Address  Phone   Notes  [facility] [address], [place] [phone] Also accepts self-pay patients.  [facility] [address] [place], [place]  [phone]   [place], [address], [place]  [phone]   [facility] [address] [place] [phone]   Lucianne Lei [address], [place]   [phone] Only accepts [place] Access [place] patients after they have their name applied to their card.   Self-Pay (no insurance) in [facility]:  Organization         Address  Phone   Notes  Sickle Cell Patients, [facility] [place] [phone]   [facility] Urgent Care [place] [phone]   [facility] [place]  [place], [place], [employer] [phone]   [facility]/Dr. Osei-Bonsu  [address] [place] or [address], [place] [phone] Phone number for both [place] and [place] locations is the same.  [facility] [address], [place] [phone]   [facility] [address] [place] or [address] [phone]   [facility] [address], [place] [phone], phone; [phone], fax Sees patients 1st and 3rd Saturday of every month.  Must not qualify for public or private insurance (i.e. Medicaid, Medicare, [place] Health Choice, Veterans' Benefits)  Household income should be no more than 200% of the poverty level The clinic cannot treat you if you are pregnant or think you are pregnant  Sexually transmitted diseases are not treated at the clinic.    Dental Care: Organization         Address  Phone  Notes  [facility] Department of [facility] [phone] Accepts children up to age 69 who are enrolled in [place] or [place]; pregnant women with a Medicaid card; and children who have applied for Medicaid or [place] Health Choice, but were declined, whose parents can pay a reduced fee at time of service.  [facility]  Department of [facility]  [address], [place] [phone] Accepts children up to age 45 who are enrolled in [place] or [place]; pregnant  women with a Medicaid card; and children who have applied for Medicaid or [place] Health Choice, but were declined, whose parents can pay a reduced fee at time of service.  [facility] Adult [facility] Access PROGRAM  [place] [phone] Patients are seen by appointment only. Walk-ins are not accepted. [place] will see patients 74 years of age and older. Monday - Tuesday (8am-5pm) Most Wednesdays (8:30-5pm) $30 per visit, cash only  [facility] Adult Dental Access PROGRAM  [address], [facility] [phone] Patients are seen by appointment only. Walk-ins are not accepted. [place]nes Giannotti [place] will see patients 34 years of age and older. One Wednesday Evening (Monthly: Volunteer Based).  $30 per visit, cash only  [place]  [phone] for adults; Children under age 44, call Graduate Pediatric Dentistry at [phone]. Children aged 40-14, please call [phone] to request a pediatric application.  Dental services are provided in all areas of dental care including fillings, crowns and bridges, complete and partial dentures, implants, gum treatment, root canals, and extractions. Preventive care is also provided. Treatment is provided to both adults and children. Patients are selected via a lottery and there is often a waiting list.   [facility] [address] [place]  [phone] [email or web address]   [facility] [address] [place], [place] [phone], Ext. [phone] Second and Fourth Thursday of each month, opens at 6:30 AM; Clinic ends at 9 AM.  Patients are seen on a first-come first-served basis, and a limited number are seen during each clinic.   [facility]  [address] Hillard Danker [place], [place] [phone]    Eligibility Requirements You must have lived in [place], [place], or [place] counties for at least the last three months.   You cannot be eligible for state or federal sponsored [employer], including [employer], [place], or [employer].   You generally cannot be eligible for healthcare insurance through your employer.    How to apply: Eligibility screenings are held every Tuesday and Wednesday afternoon from 1:00 pm until 4:00 pm. You do not need an appointment for the interview!  [facility] [address], [place], [place]   [place]  [place] Department  [place]  [phone]    Behavioral Health Resources in the Community: Intensive Outpatient Programs Organization         Address  Phone  Notes  [place]. [address], [place], [place] [phone]   [facility] Outpatient [address] [place], [place]   ADS: Alcohol & Drug Svcs [address], [place], [place]   [place] [address],  [place], [place] or [phone]   Substance Abuse Resources Organization         Address  Phone  Notes  Alcohol and Drug Services  [phone]   [place]  [phone]   [facility] [place]   Chinita Pester  [phone]   Residential & Outpatient Substance Abuse Program  [phone]   Psychological Services Organization         Address  Phone  Notes  [facility] [place]  [place]  [phone]   [place] [address] [place] or [phone]    Mobile Crisis Teams  Organization         Address  Phone  Notes  [facility], Mobile Crisis Care Unit  [phone]   Assertive Psychotherapeutic Services  [address]  [place], [place]   [facility] [address], [place] [phone]    Self-Help/Support Groups Organization         Address  Phone             Notes  [place]. of [place] - variety of support groups  [place] Call for more information  Narcotics Anonymous (NA), [facility] [address], [employer] [place]  2 meetings at this location   Special educational needs teacher         Address  Phone  Notes  [facility]AP Residential Treatment [place],    [place]  [phone]   [facility]  [address], [place] [id number], [place], [place]   [place], [place] [phone] Admissions: 8am-3pm M-F  Incentives Substance [place] [address],    [place], [place] [phone]   [facility] [address] [place], [place], [place]   [facility] [address]  [place], [place]   [facility] Programs - Intensive Outpatient [address]een [address], [place], [place]   [facility] ([place].,  [place], [place] [phone] or [phone]   [facility]idential Treatment Services (RTS) [address] [place], [place] Accepts Medicaid  [facility] [place] [address]  [place] [phone] Substance Abuse/Addiction Treatment   [facility] Organization         Address  Phone  Notes  [facility]  [phone]   Domenic Schwab, PhD [address] Arlis Porta [place], [place]   [phone] or [phone]   [place], [place] [phone]   [facility] [address] [place], [place] [phone] Insurance/Medicaid/sponsorship through [facility] and Families [address] [place]                                    [place], [place] [phone] [place] [address], [place] [phone]    Dr. Adele Schilder  [phone]   [facility] Dept. 1) [address] [place] 2) [place] 3)  [place] [address], [place] [phone]  [phone]   [place] [phone] or [phone] (After Hours)

## 2015-08-06 NOTE — ED Provider Notes (Signed)
CSN: [id number]     Arrival date & time 08/06/15  [id number] History   First MD Initiated Contact with Patient 08/06/15 1007     Chief Complaint  Patient presents with  . URI     (Consider location/radiation/quality/duration/timing/severity/associated sxs/prior Treatment) HPI   Pt presents with one day of nasal congestion, rhinorrhea, sore throat, dry cough, myalgias, arthrialgias, headache, decreased appetite, nausea with eating.  Has tried dayquil without improvement.  No known sick contacts.  She did get a flu vx this year.     Past Medical History  Diagnosis Date  . Sinus headache   . Hypertension   . Trichomonas   . Chlamydia   . Anemia   . Syphilis    Past Surgical History  Procedure Laterality Date  . Dilation and evacuation  07/16/2012    Procedure: DILATATION AND EVACUATION;  Surgeon: Osborne Oman, MD;  Location: [facility];  Service: Gynecology;  Laterality: N/A;  . Abscess drainage      arms   Family History  Problem Relation Age of Onset  . Cancer Father    Social History  Substance Use Topics  . Smoking status: Never Smoker   . Smokeless tobacco: Never Used  . Alcohol Use: No   OB History    Gravida Para Term Preterm AB TAB SAB Ectopic Multiple Living   4 3 3  1  1   0 2     Review of Systems  All other systems reviewed and are negative.     Allergies  Review of patient's allergies indicates no known allergies.  Home Medications   Prior to Admission medications   Medication Sig Start Date End Date Taking? Authorizing Provider  ferrous sulfate (FERROUSUL) 325 (65 FE) MG tablet Take 1 tablet (325 mg total) by mouth 2 (two) times daily. 06/28/14   Gwen Pounds, CNM  Prenatal Vit-Fe Fumarate-FA (PRENATAL MULTIVITAMIN) TABS tablet Take 1 tablet by mouth daily.     Historical Provider, MD  trimethoprim-polymyxin b (POLYTRIM) ophthalmic solution Place 2 drops into the right eye every 4 (four) hours. 02/18/15   Heather Laisure, PA-C   BP 130/86 mmHg   Pulse 122  Temp(Src) 101.4 F (38.6 C) (Oral)  Resp 18  SpO2 99% Physical Exam  Constitutional: She appears well-developed and well-nourished. No distress.  HENT:  Head: Normocephalic and atraumatic.  Mouth/Throat: Oropharynx is clear and moist. No oropharyngeal exudate.  Eyes: Conjunctivae are normal.  Neck: Normal range of motion. Neck supple.  Cardiovascular: Normal rate and regular rhythm.   Pulmonary/Chest: Effort normal and breath sounds normal. No respiratory distress. She has no wheezes. She has no rales.  Abdominal: Soft. She exhibits no distension. There is no rebound and no guarding.  Neurological: She is alert. She exhibits normal muscle tone.  Skin: She is not diaphoretic.  Nursing note and vitals reviewed.   ED Course  Procedures (including critical care time) Labs Review Labs Reviewed - No data to display  Imaging Review Dg Chest 2 View  08/06/2015  CLINICAL DATA:  Cough, congestion and shortness of breath along with fever beginning yesterday. EXAM: CHEST  2 VIEW COMPARISON:  03/16/2014 FINDINGS: The heart size and mediastinal contours are within normal limits. Both lungs are clear. No pleural effusion or pneumothorax. The visualized skeletal structures are unremarkable. IMPRESSION: Normal chest radiographs. Electronically Signed   By: Lajean Manes M.D.   On: 08/06/2015 10:45      EKG Interpretation None      MDM  Final diagnoses:  Influenza-like illness    Afebrile, nontoxic patient with constellation of symptoms suggestive of viral syndrome, possibly influenza.  No concerning findings on exam.  CXR negative.  No meningeal signs.  Denies possibility of pregnancy.  Discharged home with supportive care, PCP follow up.  Discussed result, findings, treatment, and follow up  with patient.  Pt given return precautions.  Pt verbalizes understanding and agrees with plan.        Clayton Bibles, PA-C 08/06/15 1119  Sherwood Gambler, MD 08/07/15 0700

## 2015-08-06 NOTE — ED Notes (Signed)
Per EMS-fever, chills since yesterday-no N/V/D-

## 2016-02-09 ENCOUNTER — Ambulatory Visit (HOSPITAL_COMMUNITY)
Admission: EM | Admit: 2016-02-09 | Discharge: 2016-02-09 | Disposition: A | Discharge status: Home / Self Care | Payer: No Typology Code available for payment source | Attending: Emergency Medicine | Admitting: Emergency Medicine

## 2016-02-09 ENCOUNTER — Encounter (HOSPITAL_COMMUNITY): Payer: Self-pay | Admitting: Emergency Medicine

## 2016-02-09 DIAGNOSIS — M722 Plantar fascial fibromatosis: Secondary | ICD-10-CM

## 2016-02-09 MED ORDER — TRAMADOL HCL 50 MG PO TABS: 50.0000 mg | ORAL_TABLET | Freq: Four times a day (QID) | ORAL | Status: DC | PRN

## 2016-02-09 MED ORDER — MELOXICAM 15 MG PO TABS: 15.0000 mg | ORAL_TABLET | Freq: Every day | ORAL | Status: DC

## 2016-02-09 NOTE — ED Notes (Signed)
The patient presented to the [facility] with a complaint of right foot pain that started yesterday. The patient stated that she was running in the rain and when she sat down later her right foot was hurting in the bottom.

## 2016-02-09 NOTE — Discharge Instructions (Signed)
You have something called plantar fasciitis. Roll your foot over a frozen water bottle for 10-15 minutes 3 times a day.  This will be very uncomfortable initially. Take meloxicam daily for 1 week, then as needed for pain. Do not take ibuprofen, Advil, or Aleve with this medicine. Please wear a well cushioned shoe, particularly in the heel. Use the tramadol every 6-8 hours as needed for severe pain. Do not drive while taking this medicine. You should see improvement in the next few days, but this will likely take 2 weeks to fully resolve. Follow-up as needed.
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
## 2016-04-05 IMAGING — US US OB LIMITED
1 series · 14 of 23 positions shown · non-contrast
Comparison: none

[Series 1: us ob limited · 0.23mm/px · 23 acquisitions, 14 frames shown]
[im 1/23]
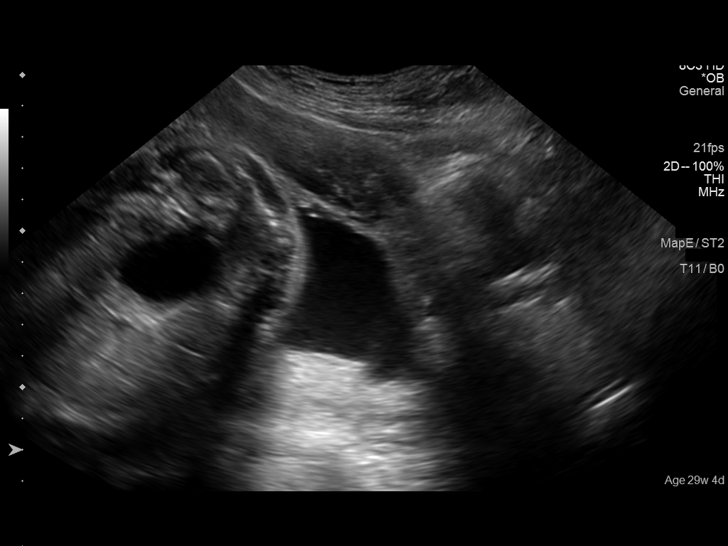
[im 3/23]
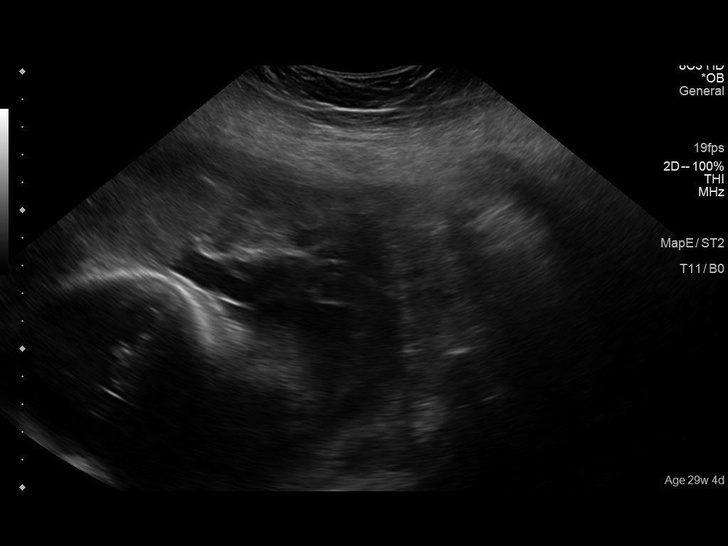
[im 5/23]
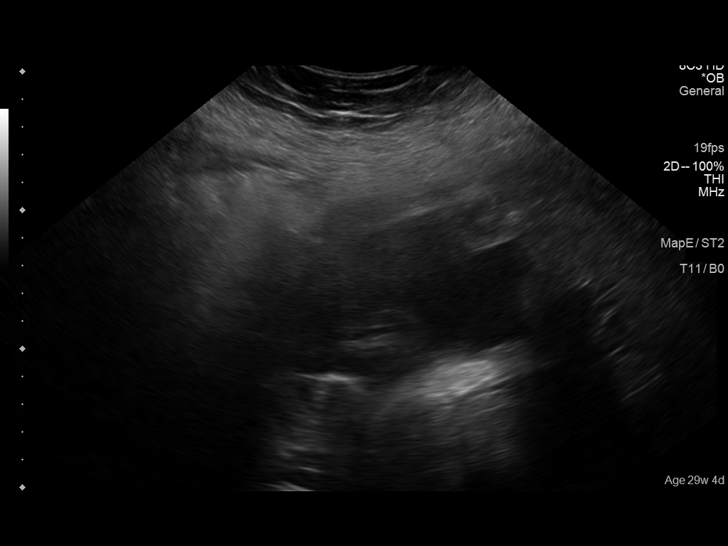
[im 6/23]
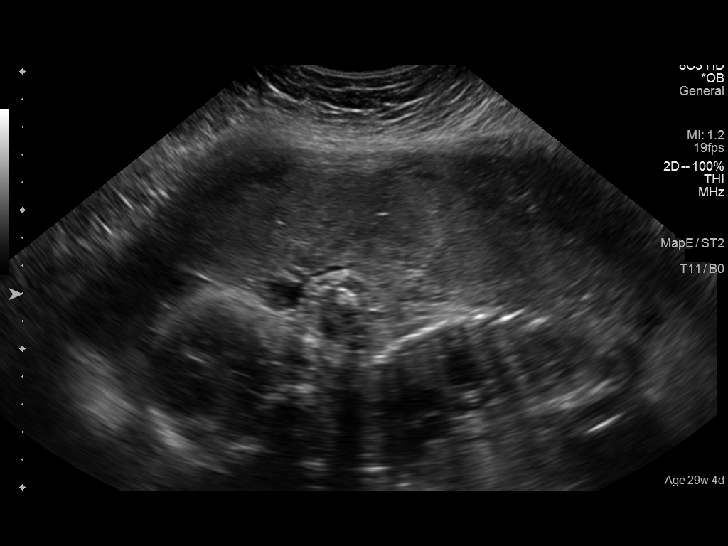
[im 8/23]
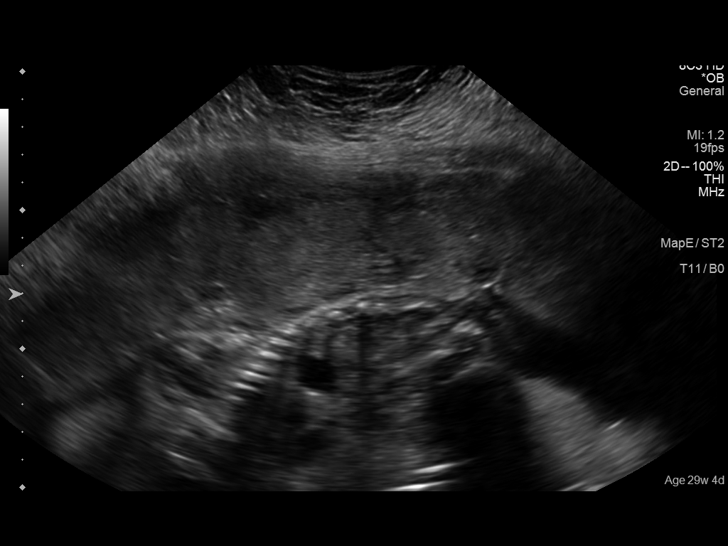
[im 10/23]
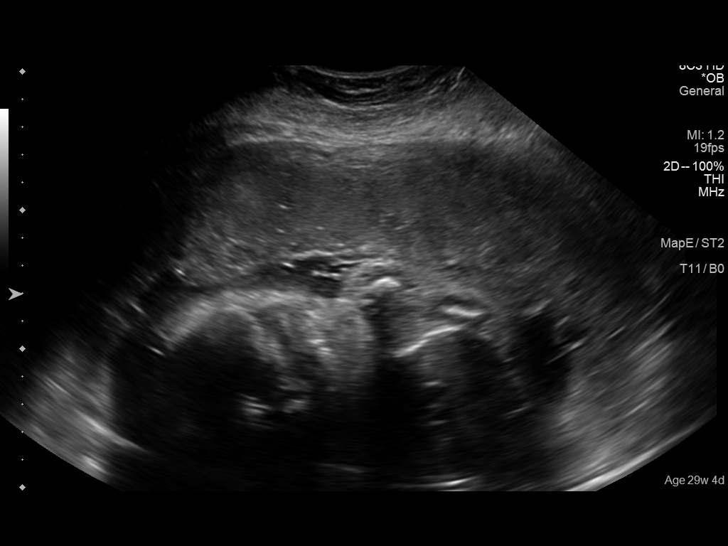
[im 11/23]
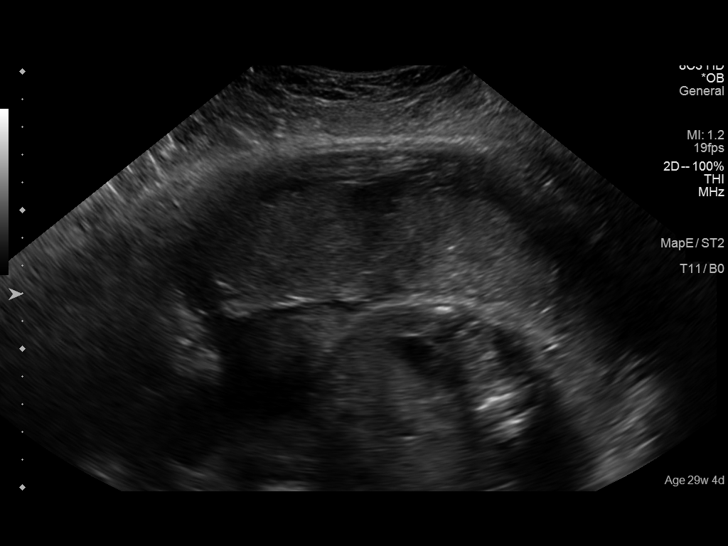
[im 13/23]
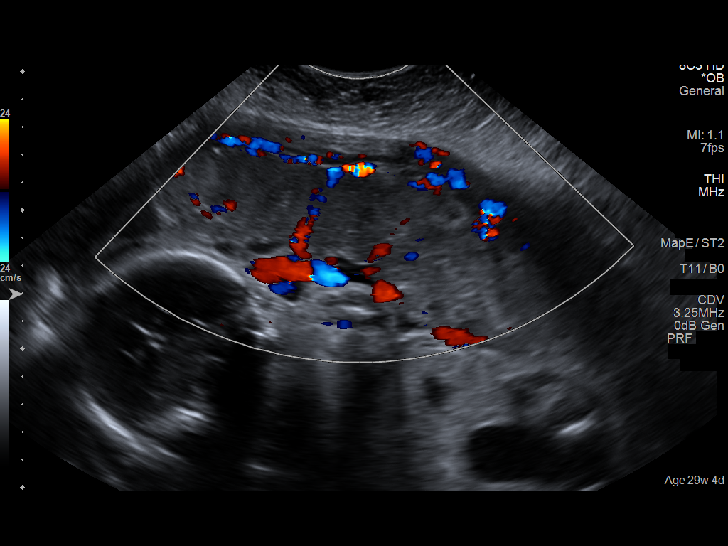
[im 14/23]
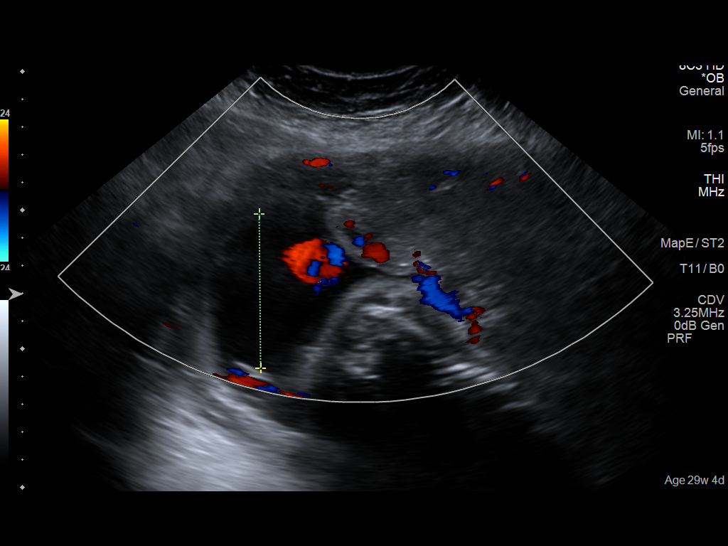
[im 16/23]
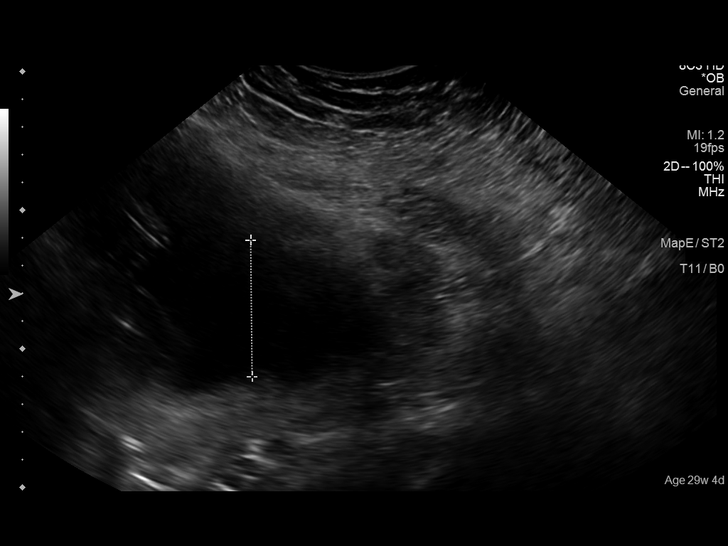
[im 18/23]
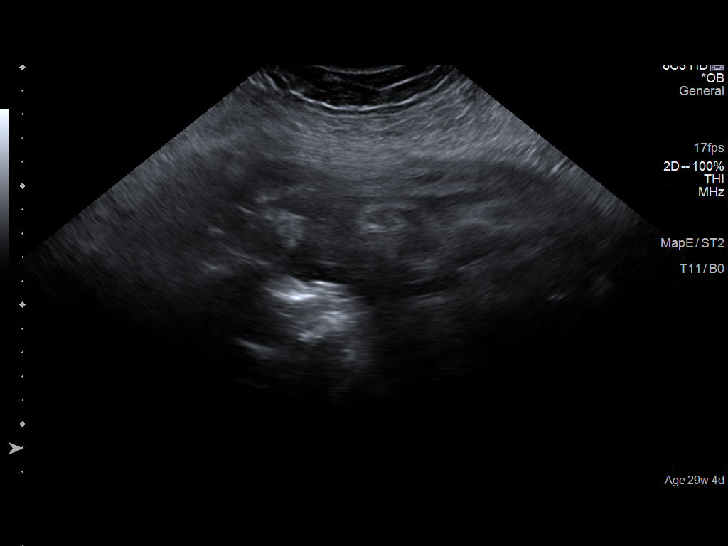
[im 19/23]
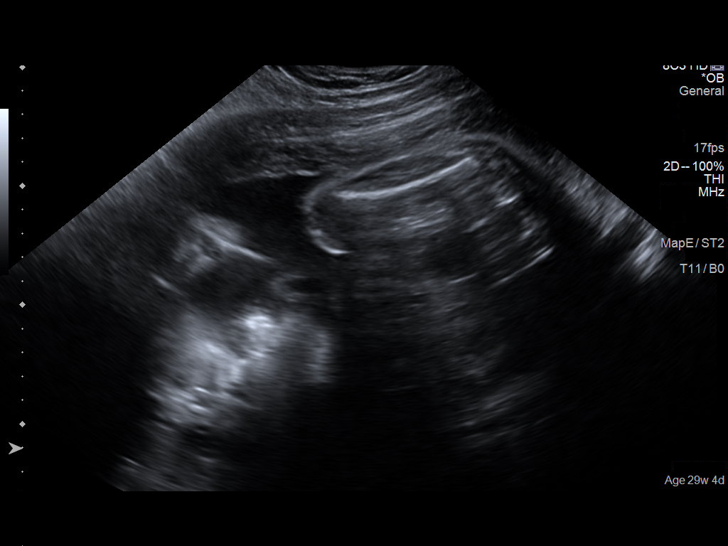
[im 21/23]
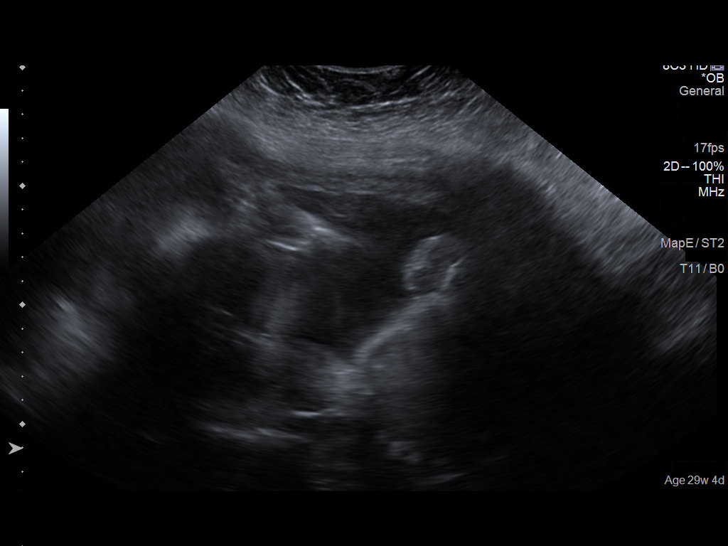
[im 23/23]
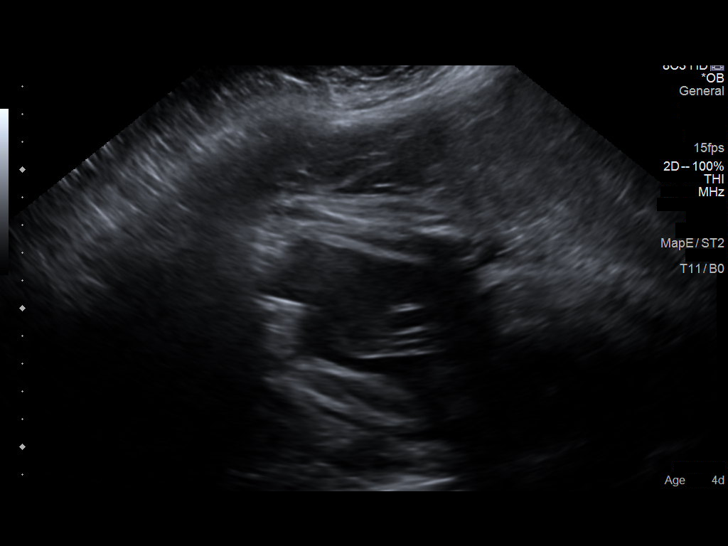

[14 of 23 positions shown; findings below may reference images not displayed]

OBSTETRICS REPORT
                      (Signed Final 05/05/2014 [DATE])

Service(s) Provided

 [HOSPITAL]                                         76815.0
Indications

 Hypertension - Chronic/Pre-existing
 29 weeks gestation of pregnancy

Fetal Evaluation

 Num Of Fetuses:    1
 Fetal Heart Rate:  139                          bpm
 Cardiac Activity:  Observed
 Presentation:      Breech
 Placenta:          Anterior, above cervical os

 Amniotic Fluid
 AFI FV:      Subjectively within normal limits
 AFI Sum:     17.62   cm       66  %Tile     Larg Pckt:    5.56  cm
 RUQ:   5.56    cm   RLQ:    2.56   cm    LUQ:   4.58    cm   LLQ:    4.92   cm
Gestational Age

 LMP:           29w 1d        Date:  10/12/13                 EDD:   07/19/14
 Best:          29w 1d     Det. By:  LMP  (10/12/13)          EDD:   07/19/14
Cervix Uterus Adnexa

 Cervix:       Not visualized (advanced GA >18wks)

 Adnexa:     No abnormality visualized.
Impression

 SIUP at 29+1 weeks
 Normal amniotic fluid volume
Recommendations

 Follow-up as clinically indicated

## 2018-02-17 ENCOUNTER — Emergency Department (HOSPITAL_COMMUNITY)
Admission: EM | Admit: 2018-02-17 | Discharge: 2018-02-17 | Disposition: A | Discharge status: Home / Self Care | Payer: Self-pay | Attending: Emergency Medicine | Admitting: Emergency Medicine

## 2018-02-17 ENCOUNTER — Encounter (HOSPITAL_COMMUNITY): Payer: Self-pay | Admitting: Emergency Medicine

## 2018-02-17 ENCOUNTER — Emergency Department (HOSPITAL_COMMUNITY): Payer: Self-pay

## 2018-02-17 ENCOUNTER — Other Ambulatory Visit: Payer: Self-pay

## 2018-02-17 DIAGNOSIS — I10 Essential (primary) hypertension: Secondary | ICD-10-CM | POA: Insufficient documentation

## 2018-02-17 DIAGNOSIS — W182XXA Fall in (into) shower or empty bathtub, initial encounter: Secondary | ICD-10-CM | POA: Insufficient documentation

## 2018-02-17 DIAGNOSIS — W19XXXA Unspecified fall, initial encounter: Secondary | ICD-10-CM

## 2018-02-17 DIAGNOSIS — R110 Nausea: Secondary | ICD-10-CM | POA: Insufficient documentation

## 2018-02-17 DIAGNOSIS — Y999 Unspecified external cause status: Secondary | ICD-10-CM | POA: Insufficient documentation

## 2018-02-17 DIAGNOSIS — S0003XA Contusion of scalp, initial encounter: Secondary | ICD-10-CM | POA: Insufficient documentation

## 2018-02-17 DIAGNOSIS — Z79899 Other long term (current) drug therapy: Secondary | ICD-10-CM | POA: Insufficient documentation

## 2018-02-17 DIAGNOSIS — R42 Dizziness and giddiness: Secondary | ICD-10-CM | POA: Insufficient documentation

## 2018-02-17 DIAGNOSIS — Y93E1 Activity, personal bathing and showering: Secondary | ICD-10-CM | POA: Insufficient documentation

## 2018-02-17 DIAGNOSIS — Y92002 Bathroom of unspecified non-institutional (private) residence single-family (private) house as the place of occurrence of the external cause: Secondary | ICD-10-CM | POA: Insufficient documentation

## 2018-02-17 DIAGNOSIS — Y92009 Unspecified place in unspecified non-institutional (private) residence as the place of occurrence of the external cause: Secondary | ICD-10-CM

## 2018-02-17 MED ORDER — ONDANSETRON 4 MG PO TBDP
4.0000 mg | ORAL_TABLET | Freq: Once | ORAL | Status: AC
  Administered 2018-02-17: 4 mg via ORAL
  Filled 2018-02-17: qty 1

## 2018-02-17 MED ORDER — ACETAMINOPHEN 325 MG PO TABS
650.0000 mg | ORAL_TABLET | Freq: Once | ORAL | Status: AC
  Administered 2018-02-17: 650 mg via ORAL
  Filled 2018-02-17: qty 2

## 2018-02-17 NOTE — Discharge Instructions (Addendum)
Apply to the injured area, take tylenol as needed for pain. Follow up with your doctor or return here as needed.

## 2018-02-17 NOTE — ED Provider Notes (Signed)
[facility] DEPT Provider Note   CSN: [id number] Arrival date & time: 02/17/18  1912     History   Chief Complaint Chief Complaint  Patient presents with  . Headache  . Fall    HPI Amber Sanders is a 32 y.o. female who presents to the ED via EMS after slipping and falling while getting out of the shower today. Patient denies LOC but reports bad headache, dizziness and nausea. Patient states that she tripped causing her to slip and fall and hit her head on the floor.   HPI  Past Medical History:  Diagnosis Date  . Anemia   . Chlamydia   . Hypertension   . Sinus headache   . Syphilis   . Trichomonas     Patient Active Problem List   Diagnosis Date Noted  . Pregnancy induced hypertension 07/14/2014  . Determine fetal presentation using ultrasound   . [redacted] weeks gestation of pregnancy   . 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 ABSCESS DRAINAGE     arms  . DILATION AND EVACUATION  07/16/2012   Procedure: DILATATION AND EVACUATION;  Surgeon: Osborne Oman, MD;  Location: [facility];  Service: Gynecology;  Laterality: N/A;     OB History    Gravida  4   Para  3   Term  3   Preterm      AB  1   Living  2     SAB  1   TAB      Ectopic      Multiple  0   Live Births  2            Home Medications    Prior to Admission medications   Medication Sig Start Date End Date Taking? Authorizing Provider  ferrous sulfate (FERROUSUL) 325 (65 FE) MG tablet Take 1 tablet (325 mg total) by mouth 2 (two) times daily. 06/28/14   Gwen Pounds, CNM  ibuprofen (ADVIL,MOTRIN) 800 MG tablet Take 1 tablet (800 mg total) by mouth every 8 (eight) hours as needed for fever, headache, mild pain or moderate pain. 08/06/15   Clayton Bibles, PA-C  meloxicam (MOBIC) 15 MG tablet Take 1 tablet (15 mg total) by mouth daily. For 1 week, then as needed for pain 02/09/16   Melony Overly, MD  Prenatal  Vit-Fe Fumarate-FA (PRENATAL MULTIVITAMIN) TABS tablet Take 1 tablet by mouth daily.     [provider]  traMADol (ULTRAM) 50 MG tablet Take 1 tablet (50 mg total) by mouth every 6 (six) hours as needed. 02/09/16   Melony Overly, MD    Family History Family History  Problem Relation Age of Onset  . Cancer Father     Social History Social History   Tobacco Use  . Smoking status: Never Smoker  . Smokeless tobacco: Never Used  Substance Use Topics  . Alcohol use: No  . Drug use: No     Allergies   Coconut oil and Shellfish allergy   Review of Systems Review of Systems  Gastrointestinal: Positive for nausea.  Neurological: Positive for light-headedness and headaches.  All other systems reviewed and are negative.    Physical Exam Updated Vital Signs BP (!) 144/105 (BP Location: Left Arm)   Pulse 78   Temp 98.5 F (36.9 C) (Oral)   Resp 14   Ht 5\' 7"  (1.702 m)   Wt 136.1 kg (300 lb)  SpO2 100%   BMI 46.99 kg/m   Physical Exam  Constitutional: She appears well-developed and well-nourished. No distress.  HENT:  Head: Head is with contusion.    Right Ear: Tympanic membrane normal.  Left Ear: Tympanic membrane normal.  Nose: Nose normal.  Mouth/Throat: Oropharynx is clear and moist.  Eyes: Pupils are equal, round, and reactive to light. Conjunctivae and EOM are normal.  Neck: Neck supple.  Cardiovascular: Normal rate.  Pulmonary/Chest: Effort normal.  Abdominal: Soft. There is no tenderness.  Musculoskeletal: Normal range of motion.  Grips are equal  Neurological: She is alert. She has normal strength. No cranial nerve deficit. She displays a negative Romberg sign. Gait normal.  Stands on one foot without difficulty  Skin: Skin is warm and dry.  Nursing note and vitals reviewed.    ED Treatments / Results  Labs (all labs ordered are listed, but only abnormal results are displayed) Labs Reviewed - No data to display  Radiology Ct Head Wo  Contrast  Result Date: 02/17/2018 CLINICAL DATA:  Fall getting out of shower, hit head EXAM: CT HEAD WITHOUT CONTRAST TECHNIQUE: Contiguous axial images were obtained from the base of the skull through the vertex without intravenous contrast. COMPARISON:  09/15/2011 FINDINGS: Brain: No acute intracranial abnormality. Specifically, no hemorrhage, hydrocephalus, mass lesion, acute infarction, or significant intracranial injury. Vascular: No hyperdense vessel or unexpected calcification. Skull: No acute calvarial abnormality. Sinuses/Orbits: Near complete opacities crash that near complete opacification of the right maxillary sinus and scattered right ethmoid air cells as well as the right maxillary sinus. Mastoid air cells are clear. Orbital soft tissues unremarkable. Other: None IMPRESSION: No intracranial abnormality. Opacified right paranasal sinuses, likely chronic sinusitis. Electronically Signed   By: Rolm Baptise M.D.   On: 02/17/2018 20:33    Procedures Procedures (including critical care time)  Medications Ordered in ED Medications  acetaminophen (TYLENOL) tablet 650 mg (650 mg Oral Given 02/17/18 2041)  ondansetron (ZOFRAN-ODT) disintegrating tablet 4 mg (4 mg Oral Given 02/17/18 2023)     Initial Impression / Assessment and Plan / ED Course  I have reviewed the triage vital signs and the nursing notes. 32 y.o. female here with headache, nausea and light headiness s/p fall stable for d/c with normal neuro exam and normal CT of head. Patient reports symptoms resolved with tylenol and zofran. Will d/c home and patient will f/u with PCP or return here for any problems.    Final Clinical Impressions(s) / ED Diagnoses   Final diagnoses:  Fall in home, initial encounter  Contusion of scalp, initial encounter    ED Discharge Orders    None       Debroah Baller [place], [place] 02/17/18 2214    Blanchie Dessert, MD 02/17/18 2250

## 2018-02-17 NOTE — ED Notes (Signed)
Patient transported to CT 

## 2018-02-17 NOTE — ED Triage Notes (Signed)
Pt presents by [facility] for evaluation of headache after slipping when getting out of the shower today. EMS reports pt denied any LOC. Pt reports tripping when getting out of shower and hit head on floor. Pt reports fall occurred appx 1 hr ago.

## 2020-02-19 ENCOUNTER — Other Ambulatory Visit: Payer: Self-pay

## 2020-02-19 ENCOUNTER — Ambulatory Visit (HOSPITAL_COMMUNITY)
Admission: EM | Admit: 2020-02-19 | Discharge: 2020-02-19 | Disposition: A | Discharge status: Home / Self Care | Payer: Self-pay | Attending: Internal Medicine | Admitting: Internal Medicine

## 2020-02-19 ENCOUNTER — Encounter (HOSPITAL_COMMUNITY): Payer: Self-pay | Admitting: Emergency Medicine

## 2020-02-19 DIAGNOSIS — N938 Other specified abnormal uterine and vaginal bleeding: Secondary | ICD-10-CM

## 2020-02-19 DIAGNOSIS — N921 Excessive and frequent menstruation with irregular cycle: Secondary | ICD-10-CM | POA: Insufficient documentation

## 2020-02-19 DIAGNOSIS — Z3202 Encounter for pregnancy test, result negative: Secondary | ICD-10-CM

## 2020-02-19 LAB — POCT URINALYSIS DIP (DEVICE)
Bilirubin Urine: NEGATIVE
Glucose, UA: NEGATIVE mg/dL
Ketones, ur: NEGATIVE mg/dL
Leukocytes,Ua: NEGATIVE
Nitrite: NEGATIVE
Protein, ur: 100 mg/dL — AB
Specific Gravity, Urine: 1.02 (ref 1.005–1.030)
Urobilinogen, UA: 0.2 mg/dL (ref 0.0–1.0)
pH: 7 (ref 5.0–8.0)

## 2020-02-19 LAB — CBC
HCT: 34.3 % — ABNORMAL LOW (ref 36.0–46.0)
Hemoglobin: 10.8 g/dL — ABNORMAL LOW (ref 12.0–15.0)
MCH: 25.4 pg — ABNORMAL LOW (ref 26.0–34.0)
MCHC: 31.5 g/dL (ref 30.0–36.0)
MCV: 80.5 fL (ref 80.0–100.0)
Platelets: 436 10*3/uL — ABNORMAL HIGH (ref 150–400)
RBC: 4.26 MIL/uL (ref 3.87–5.11)
RDW: 15.3 % (ref 11.5–15.5)
WBC: 8.8 10*3/uL (ref 4.0–10.5)
nRBC: 0 % (ref 0.0–0.2)

## 2020-02-19 LAB — TSH: TSH: 1.219 u[IU]/mL (ref 0.350–4.500)

## 2020-02-19 LAB — POC URINE PREG, ED: Preg Test, Ur: NEGATIVE

## 2020-02-19 MED ORDER — KETOROLAC TROMETHAMINE 30 MG/ML IJ SOLN
INTRAMUSCULAR | Status: AC
  Filled 2020-02-19: qty 1

## 2020-02-19 MED ORDER — MEDROXYPROGESTERONE ACETATE 10 MG PO TABS: 10.0000 mg | ORAL_TABLET | Freq: Every day | ORAL | 0 refills | Status: DC

## 2020-02-19 MED ORDER — IBUPROFEN 600 MG PO TABS: 600.0000 mg | ORAL_TABLET | Freq: Four times a day (QID) | ORAL | 0 refills | Status: DC | PRN

## 2020-02-19 MED ORDER — KETOROLAC TROMETHAMINE 30 MG/ML IJ SOLN
30.0000 mg | Freq: Once | INTRAMUSCULAR | Status: AC
  Administered 2020-02-19: 30 mg via INTRAMUSCULAR

## 2020-02-19 NOTE — ED Provider Notes (Signed)
[facility]    CSN: [id number] Arrival date & time: 02/19/20  3557      History   Chief Complaint Chief Complaint  Patient presents with  . Vaginal Bleeding    HPI Amber Sanders is a 34 y.o. female with a history of menorrhagia comes to the urgent care with heavy vaginal bleeding over the past 2 weeks.  Patient was placed on Depo shot for menorrhagia.  She is missed the last Depo shot because of insurance lapse and started experiencing vaginal bleeding 2 weeks ago.  Bleeding is at times heavy, passing clots, using about 6 pads a day and associated with crampy lower abdominal pain.  She denies any nausea or vomiting.  She feels weak but denies any dizziness, near syncope or syncopal episodes.  No cold intolerance, no weight gain.   HPI  Past Medical History:  Diagnosis Date  . Anemia   . Anemia   . Chlamydia   . Hypertension   . Sinus headache   . Syphilis   . Trichomonas     Patient Active Problem List   Diagnosis Date Noted  . Pregnancy induced hypertension 07/14/2014  . Determine fetal presentation using ultrasound   . [redacted] weeks gestation of pregnancy   . 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 ABSCESS DRAINAGE     arms  . DILATION AND EVACUATION  07/16/2012   Procedure: DILATATION AND EVACUATION;  Surgeon: Osborne Oman, MD;  Location: [facility];  Service: Gynecology;  Laterality: N/A;    OB History    Gravida  4   Para  3   Term  3   Preterm      AB  1   Living  2     SAB  1   TAB      Ectopic      Multiple  0   Live Births  2            Home Medications    Prior to Admission medications   Medication Sig Start Date End Date Taking? Authorizing Provider  ferrous sulfate (FERROUSUL) 325 (65 FE) MG tablet Take 1 tablet (325 mg total) by mouth 2 (two) times daily. 06/28/14  Yes Karim-Rhoades, Walidah N, CNM  ibuprofen (ADVIL) 600 MG tablet Take 1 tablet (600 mg total) by mouth  every 6 (six) hours as needed. 02/19/20   Alie Hardgrove, Myrene Galas, MD  medroxyPROGESTERone (PROVERA) 10 MG tablet Take 1 tablet (10 mg total) by mouth daily for 10 days. 02/19/20 02/29/20  Chase Picket, MD    Family History Family History  Problem Relation Age of Onset  . Cancer Father     Social History Social History   Tobacco Use  . Smoking status: Never Smoker  . Smokeless tobacco: Never Used  Substance Use Topics  . Alcohol use: No  . Drug use: No     Allergies   Coconut oil and Shellfish allergy   Review of Systems Review of Systems  Constitutional: Negative.   Eyes: Negative.   Respiratory: Negative.   Gastrointestinal: Positive for abdominal pain.  Genitourinary: Positive for vaginal bleeding. Negative for dyspareunia, dysuria, frequency and urgency.  Musculoskeletal: Negative.   Neurological: Positive for weakness. Negative for dizziness, light-headedness and headaches.     Physical Exam Triage Vital Signs ED Triage Vitals  Enc Vitals Group     BP 02/19/20 1046 (!) 149/108     Pulse Rate  02/19/20 1046 79     Resp 02/19/20 1046 20     Temp 02/19/20 1046 98.8 F (37.1 C)     Temp Source 02/19/20 1046 Oral     SpO2 02/19/20 1046 100 %     Weight --      Height --      Head Circumference --      Peak Flow --      Pain Score 02/19/20 1042 9     Pain Loc --      Pain Edu? --      Excl. in [place]? --    No data found.  Updated Vital Signs BP (!) 149/108 (BP Location: Right Arm) Comment (BP Location): regular cuff, forearm  Pulse 79   Temp 98.8 F (37.1 C) (Oral)   Resp 20   LMP 02/05/2020   SpO2 100%   Visual Acuity Right Eye Distance:   Left Eye Distance:   Bilateral Distance:    Right Eye Near:   Left Eye Near:    Bilateral Near:     Physical Exam Vitals and nursing note reviewed.  Constitutional:      General: She is not in acute distress.    Appearance: She is not ill-appearing.  Cardiovascular:     Rate and Rhythm: Normal rate and  regular rhythm.     Pulses: Normal pulses.     Heart sounds: Normal heart sounds.  Pulmonary:     Effort: Pulmonary effort is normal.     Breath sounds: Normal breath sounds.  Abdominal:     General: Bowel sounds are normal. There is no distension.     Palpations: Abdomen is soft.     Tenderness: There is no abdominal tenderness. There is no guarding.  Musculoskeletal:        General: Normal range of motion.  Skin:    General: Skin is warm.  Neurological:     Mental Status: She is alert.      UC Treatments / Results  Labs (all labs ordered are listed, but only abnormal results are displayed) Labs Reviewed  POCT URINALYSIS DIP (DEVICE) - Abnormal; Notable for the following components:      Result Value   Hgb urine dipstick LARGE (*)    Protein, ur 100 (*)    All other components within normal limits  CBC  TSH  POC URINE PREG, ED    EKG   Radiology No results found.  Procedures Procedures (including critical care time)  Medications Ordered in UC Medications  ketorolac (TORADOL) 30 MG/ML injection 30 mg (30 mg Intramuscular Given 02/19/20 1116)    Initial Impression / Assessment and Plan / UC Course  I have reviewed the triage vital signs and the nursing notes.  Pertinent labs & imaging results that were available during my care of the patient were reviewed by me and considered in my medical decision making (see chart for details).     1.  Menorrhagia following missing depo shot: Toradol 30 mg IM x1 dose CBC, TSH Ibuprofen 600 mg every 6 hours as needed for abdominal cramping Provera 10 mg daily for 10 days Patient is advised to follow-up with a gynecologist.  She is currently in between jobs and her insurance will start within the next couple of weeks also patient verbalizes understanding about the need to follow-up with a gynecologist.  She will make an appointment in the coming week or 2. Return precautions given. Final Clinical Impressions(s) / UC  Diagnoses  Final diagnoses:  Menorrhagia with irregular cycle   Discharge Instructions   None    ED Prescriptions    Medication Sig Dispense Auth. Provider   ibuprofen (ADVIL) 600 MG tablet Take 1 tablet (600 mg total) by mouth every 6 (six) hours as needed. 30 tablet Aurea Aronov, Myrene Galas, MD   medroxyPROGESTERone (PROVERA) 10 MG tablet Take 1 tablet (10 mg total) by mouth daily for 10 days. 10 tablet Holliday Sheaffer, Myrene Galas, MD     PDMP not reviewed this encounter.   Chase Picket, MD 02/19/20 1131

## 2020-02-19 NOTE — ED Triage Notes (Signed)
Patient reports vaginal bleeding 2 weeks ago.  Patient missed depo shot in June.   Patient complains of pain in lower abdomen.   "uncomfortable, discomfort" post urination.   Has taken OTC pain medicines

## 2020-02-19 NOTE — ED Notes (Signed)
Sample in lab

## 2020-03-02 ENCOUNTER — Encounter: Payer: Self-pay | Admitting: Obstetrics and Gynecology

## 2020-03-02 ENCOUNTER — Other Ambulatory Visit: Payer: Self-pay

## 2020-03-02 ENCOUNTER — Encounter: Payer: Self-pay | Admitting: Intensive Care

## 2020-03-02 ENCOUNTER — Emergency Department
Admission: EM | Admit: 2020-03-02 | Discharge: 2020-03-02 | Disposition: A | Discharge status: Home / Self Care | Payer: Self-pay | Attending: Emergency Medicine | Admitting: Emergency Medicine

## 2020-03-02 DIAGNOSIS — R109 Unspecified abdominal pain: Secondary | ICD-10-CM | POA: Insufficient documentation

## 2020-03-02 DIAGNOSIS — N915 Oligomenorrhea, unspecified: Secondary | ICD-10-CM | POA: Insufficient documentation

## 2020-03-02 DIAGNOSIS — Z5321 Procedure and treatment not carried out due to patient leaving prior to being seen by health care provider: Secondary | ICD-10-CM | POA: Insufficient documentation

## 2020-03-02 LAB — COMPREHENSIVE METABOLIC PANEL
ALT: 15 U/L (ref 0–44)
AST: 15 U/L (ref 15–41)
Albumin: 3.6 g/dL (ref 3.5–5.0)
Alkaline Phosphatase: 88 U/L (ref 38–126)
Anion gap: 8 (ref 5–15)
BUN: 17 mg/dL (ref 6–20)
CO2: 25 mmol/L (ref 22–32)
Calcium: 9 mg/dL (ref 8.9–10.3)
Chloride: 104 mmol/L (ref 98–111)
Creatinine, Ser: 0.94 mg/dL (ref 0.44–1.00)
GFR calc Af Amer: >60 mL/min (ref >60)
GFR calc non Af Amer: >60 mL/min (ref >60)
Glucose, Bld: 104 mg/dL — ABNORMAL HIGH (ref 70–99)
Potassium: 4.1 mmol/L (ref 3.5–5.1)
Sodium: 137 mmol/L (ref 135–145)
Total Bilirubin: 0.5 mg/dL (ref 0.3–1.2)
Total Protein: 7.9 g/dL (ref 6.5–8.1)

## 2020-03-02 LAB — CBC
HCT: 32.3 % — ABNORMAL LOW (ref 36.0–46.0)
Hemoglobin: 10.1 g/dL — ABNORMAL LOW (ref 12.0–15.0)
MCH: 25.4 pg — ABNORMAL LOW (ref 26.0–34.0)
MCHC: 31.3 g/dL (ref 30.0–36.0)
MCV: 81.4 fL (ref 80.0–100.0)
Platelets: 357 10*3/uL (ref 150–400)
RBC: 3.97 MIL/uL (ref 3.87–5.11)
RDW: 15.5 % (ref 11.5–15.5)
WBC: 10.5 10*3/uL (ref 4.0–10.5)
nRBC: 0 % (ref 0.0–0.2)

## 2020-03-02 LAB — URINALYSIS, COMPLETE (UACMP) WITH MICROSCOPIC
Bilirubin Urine: NEGATIVE
Glucose, UA: NEGATIVE mg/dL
Ketones, ur: NEGATIVE mg/dL
Leukocytes,Ua: NEGATIVE
Nitrite: NEGATIVE
Protein, ur: NEGATIVE mg/dL
Specific Gravity, Urine: 1.024 (ref 1.005–1.030)
pH: 6 (ref 5.0–8.0)

## 2020-03-02 LAB — LIPASE, BLOOD: Lipase: 35 U/L (ref 11–51)

## 2020-03-02 NOTE — ED Triage Notes (Signed)
Patient c/o vaginal bleeding intermittently X3 weeks. Seen on 02/19/20 at [place] urgent care for same. Patient reports coming in today for abdominal pain. Missed depo shot in June and has not had another shot.

## 2020-03-04 ENCOUNTER — Telehealth: Payer: Self-pay | Admitting: Obstetrics and Gynecology

## 2020-03-04 ENCOUNTER — Encounter: Payer: Self-pay | Admitting: Obstetrics and Gynecology

## 2020-03-04 ENCOUNTER — Other Ambulatory Visit: Payer: Self-pay

## 2020-03-04 ENCOUNTER — Ambulatory Visit (INDEPENDENT_AMBULATORY_CARE_PROVIDER_SITE_OTHER): Payer: Managed Care, Other (non HMO) | Admitting: Obstetrics and Gynecology

## 2020-03-04 VITALS — BP 112/73 | HR 77 | Ht 66.0 in | Wt 317.1 lb

## 2020-03-04 DIAGNOSIS — N938 Other specified abnormal uterine and vaginal bleeding: Secondary | ICD-10-CM

## 2020-03-04 DIAGNOSIS — Z3009 Encounter for other general counseling and advice on contraception: Secondary | ICD-10-CM

## 2020-03-04 DIAGNOSIS — Z3042 Encounter for surveillance of injectable contraceptive: Secondary | ICD-10-CM | POA: Diagnosis not present

## 2020-03-04 DIAGNOSIS — Z6841 Body Mass Index (BMI) 40.0 and over, adult: Secondary | ICD-10-CM

## 2020-03-04 DIAGNOSIS — N946 Dysmenorrhea, unspecified: Secondary | ICD-10-CM | POA: Diagnosis not present

## 2020-03-04 LAB — POCT URINE PREGNANCY: Preg Test, Ur: NEGATIVE

## 2020-03-04 MED ORDER — MEDROXYPROGESTERONE ACETATE 150 MG/ML IM SUSP
150.0000 mg | Freq: Once | INTRAMUSCULAR | Status: AC
  Administered 2020-03-04: 150 mg via INTRAMUSCULAR

## 2020-03-04 NOTE — Addendum Note (Signed)
Addended by: Durwin Glaze on: 03/04/2020 08:58 AM   Modules accepted: Orders

## 2020-03-04 NOTE — Progress Notes (Signed)
HPI:      [name] is a 34 y.o. [id number] who LMP was Patient's last menstrual period was 02/05/2020.  Subjective:   She presents today stating that for insurance reasons she had to discontinue Depo-Provera.  Her last shot was due in June when she didn't get her shot she began to bleed irregularly which was associated with pelvic cramping and pain.  She started to miss work.  She went to urgent care and they gave her Provera for several days which helped but did not completely stop her bleeding or pain. She presents today to discuss her pelvic pain and how to manage it as well as to stop her bleeding. She says that at this time birth control is not an issue but it might be in the future so she hopes that we can give her some birth control as well. She reports that ever since her menstrual periods began at 13 they have been heavy and crampy.  Depo-Provera has been her chosen method of birth control which has also controlled her pain and bleeding. Patient states that she has a family history of uterine fibroids.    Hx: The following portions of the patient's history were reviewed and updated as appropriate:             She  has a past medical history of Anemia, Anemia, Chlamydia, Hypertension, Sinus headache, Syphilis, and Trichomonas. She does not have any pertinent problems on file. She  has a past surgical history that includes Dilation and evacuation (07/16/2012) and Abscess drainage. Her family history includes Cancer in her father. She  reports that she has never smoked. She has never used smokeless tobacco. She reports that she does not drink alcohol and does not use drugs. She has a current medication list which includes the following prescription(s): ferrous sulfate and ibuprofen. She is allergic to coconut oil and shellfish allergy.       Review of Systems:  Review of Systems  Constitutional: Denied constitutional symptoms, night sweats, recent illness, fatigue, fever,  insomnia and weight loss.  Eyes: Denied eye symptoms, eye pain, photophobia, vision change and visual disturbance.  Ears/Nose/Throat/Neck: Denied ear, nose, throat or neck symptoms, hearing loss, nasal discharge, sinus congestion and sore throat.  Cardiovascular: Denied cardiovascular symptoms, arrhythmia, chest pain/pressure, edema, exercise intolerance, orthopnea and palpitations.  Respiratory: Denied pulmonary symptoms, asthma, pleuritic pain, productive sputum, cough, dyspnea and wheezing.  Gastrointestinal: Denied, gastro-esophageal reflux, melena, nausea and vomiting.  Genitourinary: See HPI for additional information.  Musculoskeletal: Denied musculoskeletal symptoms, stiffness, swelling, muscle weakness and myalgia.  Dermatologic: Denied dermatology symptoms, rash and scar.  Neurologic: Denied neurology symptoms, dizziness, headache, neck pain and syncope.  Psychiatric: Denied psychiatric symptoms, anxiety and depression.  Endocrine: Denied endocrine symptoms including hot flashes and night sweats.   Meds:   Current Outpatient Medications on File Prior to Visit  Medication Sig Dispense Refill   ferrous sulfate (FERROUSUL) 325 (65 FE) MG tablet Take 1 tablet (325 mg total) by mouth 2 (two) times daily. 60 tablet 1   ibuprofen (ADVIL) 600 MG tablet Take 1 tablet (600 mg total) by mouth every 6 (six) hours as needed. 30 tablet 0   No current facility-administered medications on file prior to visit.    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 Dysfunctional uterine bleeding   2. Dysmenorrhea   3. Birth control counseling   4. Morbid obesity with BMI of 50.0-59.9,  adult [facility])     Patient is experiencing dysfunctional uterine bleeding and crampy abdominal pain after coming off Depo.  Depo has worked in the past to control her bleeding and pain. It is possible she has uterine fibroids but the first step would be to stop her bleeding and see if this resulted in a resolution of her pain.   Plan:            1.  We have discussed multiple progesterone containing compounds for both cycle and birth control.  I have offered her oral progesterone, cyclic oral progesterone, Depo-Provera, Nexplanon, and IUD.  She has chosen to restart Depo-Provera.  It seems like she has a good history of Depo use that has effectively controlled her menstrual bleeding and pain. Orders No orders of the defined types were placed in this encounter.   No orders of the defined types were placed in this encounter.     F/U  Return in about 3 months (around 06/04/2020). I spent 31 minutes involved in the care of this patient preparing to see the patient by obtaining and reviewing her medical history (including labs, imaging tests and prior procedures), documenting clinical information in the electronic health record (EHR), counseling and coordinating care plans, writing and sending prescriptions, ordering tests or procedures and directly communicating with the patient by discussing pertinent items from her history and physical exam as well as detailing my assessment and plan as noted above so that she has an informed understanding.  All of her questions were answered. Finis Bud, M.D. 03/04/2020 8:21 AM

## 2020-03-04 NOTE — Telephone Encounter (Signed)
Patients mother called in stating that her daughter was seen this morning and has been having vaginal bleeding for the past 2 weeks. Patient informed her mother that she doesn't feel as though she was treated correctly, patient has a family history of fibroids that have required myomectomy's. Patient informed mother that patient was told all she needed was to be scheduled for depo injection and take some Aleve. I informed patient we wouldn't be able to speak with her regarding the patient as she is not on her DPR but I could have our practice administrator reach out to the patient regarding her concerns. Patients mother stated that would be greatly appreciated.

## 2020-03-05 NOTE — Telephone Encounter (Signed)
Pt states at her visit on 8/12 she felt like her concerns where brushed off.  Dr. Amalia Hailey did not take her concerns seriously. She wanted her to pick an option and move on.   She felt like she was treated unfair.  He kept pushing for depo so that's what she did.  She has been having heavy bleeding and pain for weeks. She states the pain effects her life. She has not been able to work for 2 weeks. She tried aleve per Dr. Amalia Hailey but that did not help. Advised Ibuprofen but that makes her sleepy. Her main concern was she felt not heard and that he wanted to give her a quick fix and move on.   Aplogized to pt for her experience and thanked her for her feedback. Offered pt a f/u with a different provider. Pt ok with this plan. Appt made with [facility].

## 2020-03-10 ENCOUNTER — Ambulatory Visit: Payer: Managed Care, Other (non HMO) | Admitting: Obstetrics and Gynecology

## 2020-03-10 ENCOUNTER — Other Ambulatory Visit: Payer: Self-pay

## 2020-03-10 ENCOUNTER — Encounter: Payer: Self-pay | Admitting: Obstetrics and Gynecology

## 2020-03-10 VITALS — BP 122/81 | HR 87 | Ht 66.0 in | Wt 318.2 lb

## 2020-03-10 DIAGNOSIS — R102 Pelvic and perineal pain: Secondary | ICD-10-CM | POA: Diagnosis not present

## 2020-03-10 DIAGNOSIS — Z8742 Personal history of other diseases of the female genital tract: Secondary | ICD-10-CM

## 2020-03-10 MED ORDER — KETOROLAC TROMETHAMINE 30 MG/ML IJ SOLN
30.0000 mg | Freq: Once | INTRAMUSCULAR | Status: AC
  Administered 2020-03-10: 30 mg via INTRAMUSCULAR

## 2020-03-10 MED ORDER — KETOROLAC TROMETHAMINE 10 MG PO TABS: 10.0000 mg | ORAL_TABLET | Freq: Four times a day (QID) | ORAL | 0 refills | Status: DC | PRN

## 2020-03-10 NOTE — Progress Notes (Signed)
Pt present for irregular cycles and heavy bleeding with pain. Pt stated that she is having a lot of her cramping, bleeding with clots.

## 2020-03-10 NOTE — Patient Instructions (Signed)
Pelvic Pain, Female Pelvic pain is pain in your lower abdomen, below your belly button and between your hips. The pain may start suddenly (be acute), keep coming back (be recurring), or last a long time (become chronic). Pelvic pain that lasts longer than 6 months is considered chronic. Pelvic pain may affect your:  Reproductive organs.  Urinary system.  Digestive tract.  Musculoskeletal system. There are many potential causes of pelvic pain. Sometimes, the pain can be a result of digestive or urinary conditions, strained muscles or ligaments, or reproductive conditions. Sometimes the cause of pelvic pain is not known. Follow these instructions at home:   Take over-the-counter and prescription medicines only as told by your health care provider.  Rest as told by your health care provider.  Do not have sex if it hurts.  Keep a journal of your pelvic pain. Write down: ? When the pain started. ? Where the pain is located. ? What seems to make the pain better or worse, such as food or your period (menstrual cycle). ? Any symptoms you have along with the pain.  Keep all follow-up visits as told by your health care provider. This is important. Contact a health care provider if:  Medicine does not help your pain.  Your pain comes back.  You have new symptoms.  You have abnormal vaginal discharge or bleeding, including bleeding after menopause.  You have a fever or chills.  You are constipated.  You have blood in your urine or stool.  You have foul-smelling urine.  You feel weak or light-headed. Get help right away if:  You have sudden severe pain.  Your pain gets steadily worse.  You have severe pain along with fever, nausea, vomiting, or excessive sweating.  You lose consciousness. Summary  Pelvic pain is pain in your lower abdomen, below your belly button and between your hips.  There are many potential causes of pelvic pain.  Keep a journal of your pelvic  pain. This information is not intended to replace advice given to you by your health care provider. Make sure you discuss any questions you have with your health care provider. Document Revised: 12/26/2017 Document Reviewed: 12/26/2017 Elsevier Patient Education  2020 Elsevier Inc.  

## 2020-03-10 NOTE — Progress Notes (Signed)
GYNECOLOGY PROGRESS NOTE  Subjective:    Patient ID: Amber Sanders, female    DOB: 05-Sep-1985, 34 y.o.   MRN: [id number]  HPI  Patient is a 34 y.o. [id number] female who presents for second opinion regarding dysfunctional uterine bleeding and pelvic pain.  Patient was previously seen by Dr. Jeannie Fend of [facility].  She had previously reported an interruption in her Depo-Provera injections due to insurance issues, causing her to have irregular bleeding and pelvic pain over the past 2 months.  She has previously used Depo-Provera as well in the past without any issues.  She received her injection last week, and notes that she has not had any more bleeding however is still noting fairly significant pelvic pain.  She reports that the pain has become so bad that she has had to miss work.  She is currently taking Tylenol (cannot recall exact dose) and ibuprofen 600 mg.  However notes that she cannot take the ibuprofen except for at night because it causes her to become drowsy.   GYN History:  Reports her last pap smear [facility] last year, was normal. History of STI's: H/o trichomoniasis, Syphilis, and Chlamydia in the past.  Contraception: Depo Provera injections   The following portions of the patient's history were reviewed and updated as appropriate:   She  has a past medical history of Anemia, Anemia, Chlamydia, Hypertension, Sinus headache, Syphilis, and Trichomonas.   She  has a past surgical history that includes Dilation and evacuation (07/16/2012) and Abscess drainage. Her family history includes Breast cancer in her mother; Cancer in her father; Diabetes in her mother. She  reports that she has never smoked. She has never used smokeless tobacco. She reports that she does not drink alcohol and does not use drugs. Current Outpatient Medications on File Prior to Visit  Medication Sig Dispense Refill  . ferrous sulfate (FERROUSUL) 325 (65 FE) MG tablet  Take 1 tablet (325 mg total) by mouth 2 (two) times daily. 60 tablet 1  . ibuprofen (ADVIL) 600 MG tablet Take 1 tablet (600 mg total) by mouth every 6 (six) hours as needed. 30 tablet 0  . medroxyPROGESTERone (DEPO-PROVERA) 150 MG/ML injection Inject 150 mg into the muscle every 3 (three) months.     No current facility-administered medications on file prior to visit.   She is allergic to coconut oil and shellfish allergy..  Review of Systems Pertinent items noted in HPI and remainder of comprehensive ROS otherwise negative.   Objective:   Blood pressure 122/81, pulse 87, height 5\' 6"  (1.676 m), weight (!) 318 lb 3.2 oz (144.3 kg), last menstrual period 02/06/2020. Body mass index is 51.36 kg/m. General appearance: alert and no distress, morbidly obese Abdomen: soft, non-tender; bowel sounds normal; no masses,  no organomegaly Pelvic: external genitalia normal, rectovaginal septum normal.  Vagina without discharge.  Cervix normal appearing, no lesions and no motion tenderness.  Uterus mobile, nontender, normal shape and size.  Adnexae non-palpable, nontender bilaterally.  Extremities: extremities normal, atraumatic, no cyanosis or edema Neurologic: Grossly normal   Assessment:   1. Pelvic pain   2. History of abnormal uterine bleeding    Plan:   1. Pelvic pain - Patient has been taking Tylenol and Ibuprofen (600). Ibuprofen makes her sleepy. Has tried [facility] Powder which helped some.  Concerned that patient would have significant drowsiness if prescribed stronger pain medications.  Offered an injection of tramadol today, followed by 5 days of  oral tablets of tramadol.  Patient okay to receive.  We will also order pelvic ultrasound to rule out other causes of pelvic pain, including ovarian cyst, or uterine fibroids.  Patient to return tomorrow for imaging. 2.  History of abnormal bleeding -patient notes that she is no longer experiencing any bleeding, has resumed her Depo-Provera  injections.  Patient to follow-up in 2 weeks if her symptoms persist.  Otherwise we will follow-up in 3 months for her next Depo-Provera injection.   Rubie Maid, MD [facility]

## 2020-03-11 ENCOUNTER — Ambulatory Visit (INDEPENDENT_AMBULATORY_CARE_PROVIDER_SITE_OTHER): Payer: Managed Care, Other (non HMO)

## 2020-03-11 DIAGNOSIS — Z8742 Personal history of other diseases of the female genital tract: Secondary | ICD-10-CM

## 2020-03-11 DIAGNOSIS — R102 Pelvic and perineal pain: Secondary | ICD-10-CM | POA: Diagnosis not present

## 2020-03-24 ENCOUNTER — Encounter: Payer: Self-pay | Admitting: Obstetrics and Gynecology

## 2020-03-24 ENCOUNTER — Other Ambulatory Visit: Payer: Self-pay

## 2020-03-24 ENCOUNTER — Ambulatory Visit: Payer: Managed Care, Other (non HMO) | Admitting: Obstetrics and Gynecology

## 2020-03-24 VITALS — BP 137/83 | HR 92 | Ht 66.0 in | Wt 319.4 lb

## 2020-03-24 DIAGNOSIS — D252 Subserosal leiomyoma of uterus: Secondary | ICD-10-CM | POA: Diagnosis not present

## 2020-03-24 DIAGNOSIS — R102 Pelvic and perineal pain: Secondary | ICD-10-CM | POA: Diagnosis not present

## 2020-03-24 MED ORDER — OXYCODONE-ACETAMINOPHEN 5-325 MG PO TABS: 1.0000 | ORAL_TABLET | Freq: Four times a day (QID) | ORAL | 0 refills | Status: DC | PRN

## 2020-03-24 NOTE — Progress Notes (Signed)
GYNECOLOGY PROGRESS NOTE  Subjective:    Patient ID: [name], female    DOB: [birth year], 34 y.o.   MRN: [id number]  HPI  Patient is a 34 y.o. [id number] female who presents for 2 week follow up of pelvic pain. Notes that after last visit she was unable to get prescription for pain medication until this past Sunday (Toradol). Notes that the injection of Toradol she received last visit made her very sleepy. Did not help her pain much.  Still experiencing severe cramping and abdominal pain.  Pain is affecting her job (currently works from home but notes it is difficult to focus due to the pain). Tries to take pain medications but they tend to make her sleepy.  The following portions of the patient's history were reviewed and updated as appropriate: allergies, current medications, past family history, past medical history, past social history, past surgical history and problem list.  Review of Systems Pertinent items noted in HPI and remainder of comprehensive ROS otherwise negative.   Objective:   Blood pressure 137/83, pulse 92, height 5\' 6"  (1.676 m), weight (!) 319 lb 6.4 oz (144.9 kg), last menstrual period 02/07/2020. General appearance: alert and no distress Abdomen: soft, non-tender; bowel sounds normal; no masses,  no organomegaly Pelvic:deferred    Imaging:  US PELVIS TRANSVAGINAL NON-OB (TV ONLY) Patient Name: MYCHELE SEYLLER DOB: 11-May-1986 MRN: [id number] ULTRASOUND REPORT  Location: [facility] Date of Service: 03/11/2020   Indications:Pelvic Pain   Findings:  The uterus is anteverted and measures 8.2 x 5.1 x 6.0 cm. Echo texture is heterogenous with evidence of focal masses. Within the uterus are multiple suspected fibroids measuring: Fibroid 1:Anterior SS 2.3 x 2.6 x 2.7 cm  The Endometrium measures 8 mm.  Right Ovary measures 2.5 x 1.9 x 2.1 cm. It is normal in appearance. Left Ovary measures 3.0 x 2.0 x 2.1 cm. It is normal in  appearance. Survey of the adnexa demonstrates no adnexal masses. There is no free fluid in the cul de sac.  Impression: 1. Enlarge fibroid uterus. 2. Survey of the adnexa demonstrates no adnexal masses.  Recommendations: 1.Clinical correlation with the patient's History and Physical Exam.  Jenine M. Albertine Grates    RDMS  I have reviewed this study and agree with documented findings.   Rubie Maid, MD [facility] US PELVIS (TRANSABDOMINAL ONLY) Patient Name: DANILYNN JEMISON DOB: [birth year] MRN: [id number] ULTRASOUND REPORT  Location: [facility]  Date of Service: 03/11/2020   Indications:Pelvic Pain   Findings:  The uterus is anteverted and measures 8.2 x 5.1 x 6.0 cm. Echo texture is heterogenous with evidence of focal masses. Within the uterus are multiple suspected fibroids measuring: Fibroid 1:Anterior SS 2.3 x 2.6 x 2.7 cm  The Endometrium measures 8 mm.  Right Ovary measures 2.5 x 1.9 x 2.1 cm. It is normal in appearance. Left Ovary measures 3.0 x 2.0 x 2.1 cm. It is normal in appearance. Survey of the adnexa demonstrates no adnexal masses. There is no free fluid in the cul de sac.  Impression: 1. Enlarge fibroid uterus. 2. Survey of the adnexa demonstrates no adnexal masses.  Recommendations: 1.Clinical correlation with the patient's History and Physical Exam.  Jenine M. Albertine Grates    RDMS  I have reviewed this study and agree with documented findings.   Rubie Maid, MD [facility]   Assessment:    1. Pelvic pain   2. Subserous leiomyoma of uterus  Plan:   1. Patient with persistent pelvic pain (previously associated with abnormal bleeding but bleeding has ceased since resumption of her Depo Provera, however pain has persisted.  Have tried OTC pain meds, Toradol (notes drowsiness and minimal affect on pain of all previous meds)).  Offered trial of stronger pain medications (Percocet), can take at night since most pain  medications make her drowsy.   2. To f/u in 1 week, televisit for f/u of pelvic pain.     Rubie Maid, MD [facility]

## 2020-03-24 NOTE — Progress Notes (Signed)
Pt present for follow up appt for irregular cycles with cramps. Pt stated that the vaginal bleeding has stopped but she is still having severe pain. Level 10 pain.

## 2020-03-24 NOTE — Patient Instructions (Signed)
Pelvic Pain, Female Pelvic pain is pain in your lower belly (abdomen), below your belly button and between your hips. The pain may start suddenly (be acute), keep coming back (be recurring), or last a long time (become chronic). Pelvic pain that lasts longer than 6 months is called chronic pelvic pain. There are many causes of pelvic pain. Sometimes the cause of pelvic pain is not known. Follow these instructions at home:   Take over-the-counter and prescription medicines only as told by your doctor.  Rest as told by your doctor.  Do not have sex if it hurts.  Keep a journal of your pelvic pain. Write down: ? When the pain started. ? Where the pain is located. ? What seems to make the pain better or worse, such as food or your period (menstrual cycle). ? Any symptoms you have along with the pain.  Keep all follow-up visits as told by your doctor. This is important. Contact a doctor if:  Medicine does not help your pain.  Your pain comes back.  You have new symptoms.  You have unusual discharge or bleeding from your vagina.  You have a fever or chills.  You are having trouble pooping (constipation).  You have blood in your pee (urine) or poop (stool).  Your pee smells bad.  You feel weak or light-headed. Get help right away if:  You have sudden pain that is very bad.  Your pain keeps getting worse.  You have very bad pain and also have any of these symptoms: ? A fever. ? Feeling sick to your stomach (nausea). ? Throwing up (vomiting). ? Being very sweaty.  You pass out (lose consciousness). Summary  Pelvic pain is pain in your lower belly (abdomen), below your belly button and between your hips.  There are many possible causes of pelvic pain.  Keep a journal of your pelvic pain. This information is not intended to replace advice given to you by your health care provider. Make sure you discuss any questions you have with your health care provider. Document  Revised: 12/26/2017 Document Reviewed: 12/26/2017 Elsevier Patient Education  2020 Elsevier Inc.  

## 2020-03-26 ENCOUNTER — Telehealth: Payer: Self-pay

## 2020-03-26 NOTE — Telephone Encounter (Signed)
Spoke to pharmacy and got the rx corrected for pt to pick up her medication.

## 2020-03-28 ENCOUNTER — Encounter: Payer: Self-pay | Admitting: Obstetrics and Gynecology

## 2020-03-28 DIAGNOSIS — R102 Pelvic and perineal pain: Secondary | ICD-10-CM | POA: Insufficient documentation

## 2020-03-28 DIAGNOSIS — D252 Subserosal leiomyoma of uterus: Secondary | ICD-10-CM | POA: Insufficient documentation

## 2020-03-28 DIAGNOSIS — Z3042 Encounter for surveillance of injectable contraceptive: Secondary | ICD-10-CM | POA: Insufficient documentation

## 2020-03-31 ENCOUNTER — Ambulatory Visit (INDEPENDENT_AMBULATORY_CARE_PROVIDER_SITE_OTHER): Payer: Managed Care, Other (non HMO) | Admitting: Obstetrics and Gynecology

## 2020-03-31 ENCOUNTER — Encounter: Payer: Self-pay | Admitting: Obstetrics and Gynecology

## 2020-03-31 ENCOUNTER — Other Ambulatory Visit: Payer: Self-pay

## 2020-03-31 VITALS — Ht 66.0 in

## 2020-03-31 DIAGNOSIS — R102 Pelvic and perineal pain: Secondary | ICD-10-CM | POA: Diagnosis not present

## 2020-03-31 DIAGNOSIS — G8929 Other chronic pain: Secondary | ICD-10-CM | POA: Diagnosis not present

## 2020-03-31 NOTE — Progress Notes (Signed)
Televisit. Pt has televisit for follow up for pelvic pain. Pt stated that she was still having pelvic pain. Pain score 10.

## 2020-03-31 NOTE — Progress Notes (Signed)
Virtual Visit via Telephone Note  I connected with Amber Sanders on 03/31/20 at 11:15 AM EDT by telephone and verified that I am speaking with the correct person using two identifiers.   I discussed the limitations, risks, security and privacy concerns of performing an evaluation and management service by telephone and the availability of in person appointments. I also discussed with the patient that there may be a patient responsible charge related to this service. The patient expressed understanding and agreed to proceed.  Patient Location: Home  Provider Location: Office   History of Present Illness:  Amber Sanders is a 34 y.o. [phone] female who presents for 1 week follow up of chronic pelvic pain.  Patient notes that she stopped use of the Toradol after 5 days as directed but did not see a difference in her pain.  Also took Percocet that was prescribed but only took 2 times as she states that it caused nausea and vomiting, so she could no longer tolerate.  Still experiencing significant pain that interferes with daily life.   Observations/Objective:  Vitals with BMI 03/31/2020 03/24/2020 03/10/2020  Height 5\' 6"  5\' 6"  5\' 6"   Weight - 319 lbs 6 oz 318 lbs 3 oz  BMI - 88.91 69.45  Systolic - 038 882  Diastolic - 83 81  Pulse - 92 87  Some encounter information is confidential and restricted. Go to Review Flowsheets activity to see all data.    Gen appeareance: patient sounds to be in no distress    Assessment and Plan: Chronic pelvic pain  Follow Up Instructions:  Advised patient to alternate with Tylenol and will given new prescription for Ibuprofen. Can also try taking 1/2 of tablet of Percocet with food to see if it is better tolerated. Also advised that if pain continued, she would likely need a referral to physical therapy or to a pain clinic for management, as she is not interested in surgical intervention at this time. Patient notes understanding.     I discussed the  assessment and treatment plan with the patient. The patient was provided and  opportunity to ask questions and all were answered. The patient agreed with the plan and demonstrated an understanding of the instructions.   The patient was advised to call back or seek an in-person evaluation if the symptoms worsen or if the condition fails to improve as anticipated.  I provided 7 minutes of non-face-to-face time during this encounter.   Amber Maid, MD [facility]

## 2020-04-01 ENCOUNTER — Encounter: Payer: Self-pay | Admitting: Obstetrics and Gynecology

## 2020-04-01 MED ORDER — IBUPROFEN 800 MG PO TABS: 800.0000 mg | ORAL_TABLET | Freq: Three times a day (TID) | ORAL | 1 refills | Status: DC | PRN

## 2020-04-01 NOTE — Patient Instructions (Signed)
Pelvic Pain, Female Pelvic pain is pain in your lower abdomen, below your belly button and between your hips. The pain may start suddenly (be acute), keep coming back (be recurring), or last a long time (become chronic). Pelvic pain that lasts longer than 6 months is considered chronic. Pelvic pain may affect your:  Reproductive organs.  Urinary system.  Digestive tract.  Musculoskeletal system. There are many potential causes of pelvic pain. Sometimes, the pain can be a result of digestive or urinary conditions, strained muscles or ligaments, or reproductive conditions. Sometimes the cause of pelvic pain is not known. Follow these instructions at home:   Take over-the-counter and prescription medicines only as told by your health care provider.  Rest as told by your health care provider.  Do not have sex if it hurts.  Keep a journal of your pelvic pain. Write down: ? When the pain started. ? Where the pain is located. ? What seems to make the pain better or worse, such as food or your period (menstrual cycle). ? Any symptoms you have along with the pain.  Keep all follow-up visits as told by your health care provider. This is important. Contact a health care provider if:  Medicine does not help your pain.  Your pain comes back.  You have new symptoms.  You have abnormal vaginal discharge or bleeding, including bleeding after menopause.  You have a fever or chills.  You are constipated.  You have blood in your urine or stool.  You have foul-smelling urine.  You feel weak or light-headed. Get help right away if:  You have sudden severe pain.  Your pain gets steadily worse.  You have severe pain along with fever, nausea, vomiting, or excessive sweating.  You lose consciousness. Summary  Pelvic pain is pain in your lower abdomen, below your belly button and between your hips.  There are many potential causes of pelvic pain.  Keep a journal of your pelvic  pain. This information is not intended to replace advice given to you by your health care provider. Make sure you discuss any questions you have with your health care provider. Document Revised: 12/26/2017 Document Reviewed: 12/26/2017 Elsevier Patient Education  2020 Elsevier Inc.  

## 2020-04-13 ENCOUNTER — Telehealth: Payer: Self-pay | Admitting: Obstetrics and Gynecology

## 2020-04-13 NOTE — Telephone Encounter (Signed)
Called pt letting her know we got a fax from [employer]. I told her I needed more info for [place] to fill out. I needed to know the dates of leave and return to work. The pt said 02/19/2020-03/17/2020. I then asked the pt does she want to pick up or we can fax it to [employer] she said she would like [place] to fax them. I told her to please allow 7-10 days for the paper work to be competed the pt verbally understood

## 2020-04-27 ENCOUNTER — Encounter: Payer: Self-pay | Admitting: Physical Therapy

## 2020-04-27 ENCOUNTER — Other Ambulatory Visit: Payer: Self-pay

## 2020-04-27 ENCOUNTER — Ambulatory Visit: Payer: Managed Care, Other (non HMO) | Attending: Obstetrics and Gynecology | Admitting: Physical Therapy

## 2020-04-27 DIAGNOSIS — M6281 Muscle weakness (generalized): Secondary | ICD-10-CM | POA: Insufficient documentation

## 2020-04-27 DIAGNOSIS — M533 Sacrococcygeal disorders, not elsewhere classified: Secondary | ICD-10-CM | POA: Diagnosis not present

## 2020-04-27 DIAGNOSIS — R2689 Other abnormalities of gait and mobility: Secondary | ICD-10-CM | POA: Insufficient documentation

## 2020-04-27 NOTE — Patient Instructions (Addendum)
  Proper body mechanics with getting out of a chair to decrease strain  on back &pelvic floor   Avoid holding your breath when Getting out of the chair:  Scoot to front part of chair chair Heels behind feet, feet are hip width apart, nose over toes  Inhale like you are smelling roses Exhale to stand    __   Adductor squeezes:  Pillow folded in half, between knees,  5 sec holds  10 reps    ___     Reclined twist for hips and side of the hips/ legs  Lay on your back, knees bend Scoot hips to the L , leave shoulders in place Drop knees to the R side resting onto pillows to keep leg at the same width of hips Pillow under L thigh to minimize too much strain   __  Wear SIJ belt for more stability

## 2020-04-28 ENCOUNTER — Encounter: Payer: Managed Care, Other (non HMO) | Admitting: Obstetrics and Gynecology

## 2020-04-28 NOTE — Therapy (Addendum)
[place] MAIN [facility] SERVICES [address] [place], [place], [address] Phone: [phone]   Fax:  [phone]  Physical Therapy Evaluation  Patient Details  Name: Amber Sanders MRN: [id number] Date of Birth: 1986-03-16 Referring Provider (PT): Marcelline Mates    Encounter Date: 04/27/2020   PT End of Session - 04/27/20 0936    Visit Number 1    Number of Visits 10    Date for PT Re-Evaluation 07/06/20    PT Start Time 0930    PT Stop Time 1030    PT Time Calculation (min) 60 min           Past Medical History:  Diagnosis Date  . Anemia   . Anemia   . Chlamydia   . Hypertension   . Pregnancy induced hypertension 07/14/2014  . Sinus headache   . Syphilis   . Trichomonas     Past Surgical History:  Procedure Laterality Date  . ABSCESS DRAINAGE     arms  . DILATION AND EVACUATION  07/16/2012   Procedure: DILATATION AND EVACUATION;  Surgeon: Osborne Oman, MD;  Location: [facility];  Service: Gynecology;  Laterality: N/A;    There were no vitals filed for this visit.    Subjective Assessment - 04/27/20 0936    Subjective 1) Pelvic pain started mid-July suddenly and it worsened overtime. At its worst , pain is at a level of 10/10 with  one Urgent Care and tried going to ER but she left early before being seen due to long wait. At its best, pain is at 7-8/10 and able to move around and do daily chores a little bit.  Currently, 8-9/10.   Pain is worst with walking, at night and first thing in the morning. Pain interrupts her sleep. Pt has had a loss of appetite but denied nausea.  Pt sleeps on her side. Last meals occur 7:30-8pm. Pt goes to bed 9-10pm. Pt sleeps for 6 hours per night. Pain interrupts her at night.  Denied intake of alcohol/ heavy foods before bed.    The pain is located from mid abdomen and sharp pains occur 2x day upper R stomach.   Gynecological Hx: US imaging found a fibroid. Family Hx of cysts.  3 kids and 1  miscarriage. All vaginal deliveries with tears with 2 children. Pt has had heavy and painful menstrual cycles since she started her period. Her miscarriage ( 2012 -2013 at 2.5 months of pregnancy) was one of the most painful experiences she went through. The pains she experienced during the miscarriage are similar to the pain she is experiencing now with location of pain and intensity.   GI Hx: Pt has not been referred to a GI. Family Hx of colon cancer. Daily water intake 5-6 ( 12 fl oz).  Bowel movements occur daily Type 4 without straining. Denied blood in stool.       Across the past 4 months, the pain has remained at 7-10/10.  Pt tried pain medications which did not help.   2) CLBP across the low back and occurs when sitting up from her computer chair with sit to stand. 6-7/10. Radiates down back of her R knee.  The numbness occurs in her low back. 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 Children's age 109, 50, 23.  Pt is a single mom and living with only her children. Pt is also going to school for [employer] and plans to graduate in  May     Pertinent History Family Hx of colon/ lung  cancer.  Pt enjoyed yoga off an app for 6 months and lost weight but since the pain started, pt has not done yoga because one time it caused the pain to be worst.    Patient Stated Goals " be able to get back to myself, sit up and do my homework and doing yoga, have more energy to do stuff with my kids "              [facility] PT Assessment - 04/27/20 0958      Assessment   Medical Diagnosis Pelvic pain    Referring Provider (PT) Cherry       Precautions   Precautions None      Restrictions   Weight Bearing Restrictions No      Balance Screen   Has the patient fallen in the past 6 months No      Observation/Other Assessments   Observations FADDIR R with pain, no pain with L       Sit to Stand   Comments pain at low abdomen, less pain with cued for exhale  with sit-tostanding       AROM   Overall AROM Comments WFL, R rotation and sideflexion with LBP       Strength   Overall Strength Comments hip flexion 4/5, knee flex 3-/5, L 3+/5, knee ext B 4/5     pain at [facility] hip abd R 2+/5, L no pain 4/5      Palpation   SI assessment  L iliac crest/ malleoli  higher in supine  ( post Tx: levelled) ,    Palpation comment concordant pain with palpation at pubic symphysis R> L and at ASIS R pre Tx,  no pain post Tx   .  Adduction strengthening exercises decreased pain      Ambulation/Gait   Gait velocity 0.6 m/s     Gait Comments minimal hip flexion/ knee flexion, hip hiking on L                       Objective measurements completed on examination: See above findings.       [facility] Adult PT Treatment/Exercise - 04/28/20 0946      Ambulation/Gait   Gait velocity 0.6 m/s preTx,  0.9 m/s post Tx     Gait Comments minimal hip flexion/ knee flexion, hip hiking on L pre-Tx, no hip hike on L, more hip /knee flexion post Tx       Neuro Re-ed    Neuro Re-ed Details  cued for new HEP       Manual Therapy   Manual therapy comments rotational mob, STM/MWm at QL, glut med/ IT band, Grade III mob at L SIJ to lower iliac crest height                        PT Long Term Goals - 04/27/20 1025      PT LONG TERM GOAL #1   Title Pt will demo increased hip abduction strength from 3/5 to > 4/5 without pain on R in order to improve gait and return to playing with her kids    Time 4    Period Weeks    Status New    Target Date 05/25/20      PT LONG TERM GOAL #2   Title Pt will demo equal alignment of pelvic girdle  and improved gait mechanics ( more hip/ knee flexion) in order to walk and return to yoga    Time 6    Period Weeks    Status New    Target Date 06/08/20      PT LONG TERM GOAL #3   Title Pt will demo IND with modified yoga postures that maintains pelvic girdle stability and equal alignment in order to minimize relapse  for Sx and to return to her yoga practice    Time 10    Period Weeks    Status New    Target Date 07/06/20      PT LONG TERM GOAL #4   Title Pt will increase her FOTO Pelvic pain score from 54 pt to > 74 pts in order to improve sleep, walking, and QOL    Time 10    Period Weeks    Status New    Target Date 07/06/20      PT LONG TERM GOAL #5   Title Pt will demo proper body mechanics to optimize pelvic girdle pain in order to study at her desk to graduate next years    Time 8    Period Weeks    Status New    Target Date 06/22/20                  Plan - 04/28/20 0924    Clinical Impression Statement Pt is a 34 yo  who presents with pelvic pain and CLBP which affects her ADLs and functional mobility. Pt's musculoskeletal assessment revealed pelvic misalignment, tenderness at pubic symphysis,  limited spinal /pelvic mobility, dyscoordination and strength of pelvic floor mm, weak hip weakness, slowed gait speed with gait deviations, poor body mechanics which places strain on the abdominal/pelvic floor mm.    Pt was provided education on etiology of Sx with anatomy, physiology explanation with images along with the benefits of customized pelvic PT Tx based on pt's medical conditions and musculoskeletal deficits.  Explained the physiology of deep core mm coordination and roles of pelvic floor function in urination, defecation, sexual function, and postural control with deep core mm system.   Following Tx today which pt tolerated without complaints, pt demo'd equal alignment of pelvic girdle, decreased tenderness/ pain at pubic symphysis, increased spinal mobility, reciprocal gait mechanics without gait deviations, and increased gait speed. Pt reported her pain from 7/10 to 0/10. Pt was provided a SIJ belt to promote pelvic stability. Pt benefits from skilled PT. Plan to introduce deep sore strengthening to promote more pelvic girdle stability.      Examination-Activity Limitations  Sleep;Bend    Stability/Clinical Decision Making Evolving/Moderate complexity    Clinical Decision Making Moderate    Rehab Potential Good    PT Frequency 1x / week    PT Duration Other (comment)   10   PT Treatment/Interventions Therapeutic exercise;Balance training;Therapeutic activities;Moist Heat;Orthotic Fit/Training;Patient/family education;Stair training;Neuromuscular re-education;Cryotherapy;Manual techniques;Taping;Scar mobilization;Gait training;Functional mobility training;Traction    Consulted and Agree with Plan of Care Patient           Patient will benefit from skilled therapeutic intervention in order to improve the following deficits and impairments:  Increased muscle spasms, Decreased activity tolerance, Decreased balance, Decreased mobility, Decreased strength, Postural dysfunction, Decreased scar mobility, Decreased endurance, Abnormal gait, Difficulty walking, Hypomobility, Improper body mechanics, Pain, Decreased coordination, Decreased knowledge of precautions, Decreased safety awareness, Impaired sensation, Decreased range of motion  Visit Diagnosis: Sacrococcygeal disorders, not elsewhere classified  Other abnormalities of gait and mobility  Muscle weakness (generalized)     Problem List Patient Active Problem List   Diagnosis Date Noted  . Pelvic pain 03/28/2020  . Subserous leiomyoma of uterus 03/28/2020  . Depo-Provera contraceptive status 03/28/2020    Jerl Mina ,PT, DPT, E-RYT  04/28/2020, 10:12 AM  [place] MAIN [facility] SERVICES [address] [place], [place], [address] Phone: [phone]   Fax:  [phone]  Name: Amber Sanders MRN: [id number] Date of Birth: 1985/10/02

## 2020-05-03 NOTE — Addendum Note (Signed)
Addended by: Jerl Mina on: 05/03/2020 10:43 AM   Modules accepted: Orders

## 2020-05-06 ENCOUNTER — Other Ambulatory Visit: Payer: Self-pay

## 2020-05-06 ENCOUNTER — Ambulatory Visit: Payer: Managed Care, Other (non HMO) | Admitting: Physical Therapy

## 2020-05-06 DIAGNOSIS — M533 Sacrococcygeal disorders, not elsewhere classified: Secondary | ICD-10-CM

## 2020-05-06 DIAGNOSIS — R2689 Other abnormalities of gait and mobility: Secondary | ICD-10-CM

## 2020-05-06 NOTE — Patient Instructions (Signed)
   Side of hip stretch:  Reclined twist for hips and side of the hips/ legs  Lay on your back, knees bend Scoot hips to the R , leave shoulders in place Drop knees to the L side resting onto pillows to keep leg at the same width of hips Pillow under L thigh to minimize too much strain    R Quad stretch with belt /towel ankle 10 reps    ___  Deep core level 1 and 2  ( handout)

## 2020-05-06 NOTE — Therapy (Signed)
[place] MAIN [facility] SERVICES [address] [place], [place], [address] Phone: [phone]   Fax:  [phone]  Physical Therapy Treatment  Patient Details  Name: Amber Sanders MRN: [id number] Date of Birth: Apr 07, 1986 Referring Provider (PT): Marcelline Mates    Encounter Date: 05/06/2020   PT End of Session - 05/06/20 1712    Visit Number 2    Number of Visits 10    Date for PT Re-Evaluation 07/06/20    PT Start Time 1007    PT Stop Time 1100    PT Time Calculation (min) 53 min           Past Medical History:  Diagnosis Date  . Anemia   . Anemia   . Chlamydia   . Hypertension   . Pregnancy induced hypertension 07/14/2014  . Sinus headache   . Syphilis   . Trichomonas     Past Surgical History:  Procedure Laterality Date  . ABSCESS DRAINAGE     arms  . DILATION AND EVACUATION  07/16/2012   Procedure: DILATATION AND EVACUATION;  Surgeon: Osborne Oman, MD;  Location: [facility];  Service: Gynecology;  Laterality: N/A;    There were no vitals filed for this visit.   Subjective Assessment - 05/06/20 1010    Subjective Pt reported she was pain free for 4 days after last session but the pain returned after walking with son and attending MD appts. the pain returned to 8-9/10 but not to the point where she could not move and it just hurts to do certain things like sitting, stand > 15 min.    Pertinent History Family Hx of colon/ lung  cancer.  Pt enjoyed yoga off an app for 6 months and lost weight but since the pain started, pt has not done yoga because one time it caused the pain to be worst.    Patient Stated Goals " be able to get back to myself, sit up and do my homework and doing yoga, have more energy to do stuff with my kids "              [facility] PT Assessment - 05/06/20 1013      Coordination   Gross Motor Movements are Fluid and Coordinated --   pelvic pertubation with deep core level 2     Strength   Overall Strength  Comments R hip 4-/5,       Palpation   Palpation comment flinching tenderness with report of LBP with deep hip flexion/ FADDIR R , tightness at ITband, medial thigh, glut med R                          [facility] Adult PT Treatment/Exercise - 05/06/20 1713      Neuro Re-ed    Neuro Re-ed Details  cued new HEP       Manual Therapy   Manual therapy comments STM/MWM at SIJ/ R glut med/ ITBand / medial thigh                        PT Long Term Goals - 04/27/20 1025      PT LONG TERM GOAL #1   Title Pt will demo increased hip abduction strength from 3/5 to > 4/5 without pain on R in order to improve gait and return to playing with her kids    Time 4    Period Weeks  Status New    Target Date 05/25/20      PT LONG TERM GOAL #2   Title Pt will demo equal alignment of pelvic girdle and improved gait mechanics ( more hip/ knee flexion) in order to walk and return to yoga    Time 6    Period Weeks    Status New    Target Date 06/08/20      PT LONG TERM GOAL #3   Title Pt will demo IND with modified yoga postures that maintains pelvic girdle stability and equal alignment in order to minimize relapse for Sx and to return to her yoga practice    Time 10    Period Weeks    Status New    Target Date 07/06/20      PT LONG TERM GOAL #4   Title Pt will increase her FOTO Pelvic pain score from 54 pt to > 74 pts in order to improve sleep, walking, and QOL    Time 10    Period Weeks    Status New    Target Date 07/06/20      PT LONG TERM GOAL #5   Title Pt will demo proper body mechanics to optimize pelvic girdle pain in order to study at her desk to graduate next years    Time 8    Period Weeks    Status New    Target Date 06/22/20                 Plan - 05/06/20 1704    Clinical Impression Statement Pt demo'd good carry over with equal pelvic girdle alignment, no more tenderness/ pain at pubic symphysis and increased R hip abduction strength which  indicates increased pelvic girdle stability.  Pt reported 4 days of pain relief.  Today, pt demo'd R SIJ / tightness at ITband, medial thigh, glut med R and flinching pain at LBP by SIJ. Following Tx which adjusted to gentle techniques to minimize pain, pt demo'd increased SIJ mobility and decreased in areas that were tight. Plan to address lumbar and rest of spine and assess pelvic floor at next session.  Initiated deep core strengthening with cues for pelvic stability to improve postural stability. Pt continues to benefit from skilled PT     Examination-Activity Limitations Sleep;Bend    Stability/Clinical Decision Making Evolving/Moderate complexity    Rehab Potential Good    PT Frequency 1x / week    PT Duration Other (comment)   10   PT Treatment/Interventions Therapeutic exercise;Balance training;Therapeutic activities;Moist Heat;Orthotic Fit/Training;Patient/family education;Stair training;Neuromuscular re-education;Cryotherapy;Manual techniques;Taping;Scar mobilization;Gait training;Functional mobility training;Traction    Consulted and Agree with Plan of Care Patient           Patient will benefit from skilled therapeutic intervention in order to improve the following deficits and impairments:  Increased muscle spasms, Decreased activity tolerance, Decreased balance, Decreased mobility, Decreased strength, Postural dysfunction, Decreased scar mobility, Decreased endurance, Abnormal gait, Difficulty walking, Hypomobility, Improper body mechanics, Pain, Decreased coordination, Decreased knowledge of precautions, Decreased safety awareness, Impaired sensation, Decreased range of motion  Visit Diagnosis: Sacrococcygeal disorders, not elsewhere classified  Other abnormalities of gait and mobility     Problem List Patient Active Problem List   Diagnosis Date Noted  . Pelvic pain 03/28/2020  . Subserous leiomyoma of uterus 03/28/2020  . Depo-Provera contraceptive status 03/28/2020     Jerl Mina ,PT, DPT, E-RYT  05/06/2020, 5:17 PM  [place] MAIN [facility] SERVICES [address]  [place], [place], [address] Phone: [phone]   Fax:  [phone]  Name: SHEMICKA COHRS MRN: [id number] Date of Birth: 1986/04/19

## 2020-05-13 ENCOUNTER — Ambulatory Visit: Payer: Managed Care, Other (non HMO) | Admitting: Physical Therapy

## 2020-05-20 ENCOUNTER — Ambulatory Visit: Payer: Managed Care, Other (non HMO) | Admitting: Physical Therapy

## 2020-05-27 ENCOUNTER — Ambulatory Visit: Payer: Managed Care, Other (non HMO) | Admitting: Physical Therapy

## 2020-05-27 ENCOUNTER — Encounter: Payer: Self-pay | Admitting: Physical Therapy

## 2020-05-31 ENCOUNTER — Ambulatory Visit: Payer: Managed Care, Other (non HMO)

## 2020-05-31 ENCOUNTER — Encounter: Payer: Self-pay | Admitting: Physical Therapy

## 2020-06-03 ENCOUNTER — Ambulatory Visit: Payer: Managed Care, Other (non HMO) | Admitting: Physical Therapy

## 2020-06-07 ENCOUNTER — Ambulatory Visit: Payer: Managed Care, Other (non HMO)

## 2020-06-10 ENCOUNTER — Ambulatory Visit: Payer: Managed Care, Other (non HMO) | Admitting: Physical Therapy

## 2020-06-22 ENCOUNTER — Other Ambulatory Visit: Payer: Self-pay

## 2020-06-22 MED ORDER — MEDROXYPROGESTERONE ACETATE 150 MG/ML IM SUSP: 150.0000 mg | INTRAMUSCULAR | 0 refills | Status: DC

## 2020-06-24 ENCOUNTER — Other Ambulatory Visit: Payer: Self-pay

## 2020-06-24 ENCOUNTER — Ambulatory Visit: Payer: Managed Care, Other (non HMO) | Admitting: Physical Therapy

## 2020-06-24 ENCOUNTER — Ambulatory Visit (INDEPENDENT_AMBULATORY_CARE_PROVIDER_SITE_OTHER): Payer: Self-pay

## 2020-06-24 VITALS — BP 130/84 | HR 78 | Ht 66.0 in | Wt 325.5 lb

## 2020-06-24 DIAGNOSIS — Z3042 Encounter for surveillance of injectable contraceptive: Secondary | ICD-10-CM

## 2020-06-24 LAB — POCT URINE PREGNANCY: Preg Test, Ur: NEGATIVE

## 2020-06-24 MED ORDER — MEDROXYPROGESTERONE ACETATE 150 MG/ML IM SUSP
150.0000 mg | Freq: Once | INTRAMUSCULAR | Status: AC
Start: 2020-06-24 — End: 2020-06-24
  Administered 2020-06-24: 150 mg via INTRAMUSCULAR

## 2020-06-24 NOTE — Progress Notes (Signed)
Last depo inj: restarting UPT: NEGATIVE UNKNOWN LMP Side effects: N/A Next Depo- Provera injection due: 09/09/20-09/23/20 Annual exam due: 03/31/20 Seen for Chronic Pelvic Pain. No records found for AE.

## 2020-07-01 ENCOUNTER — Ambulatory Visit: Payer: Managed Care, Other (non HMO) | Admitting: Physical Therapy

## 2020-07-08 ENCOUNTER — Ambulatory Visit: Payer: Managed Care, Other (non HMO) | Admitting: Physical Therapy

## 2020-08-20 ENCOUNTER — Telehealth: Payer: Self-pay

## 2020-08-20 NOTE — Telephone Encounter (Signed)
No nurse for depo on 2/23. Called pt [facility] move pts appt to 2/24

## 2020-09-15 ENCOUNTER — Ambulatory Visit: Payer: Self-pay

## 2020-09-16 ENCOUNTER — Ambulatory Visit: Payer: Self-pay

## 2020-12-23 ENCOUNTER — Emergency Department (HOSPITAL_COMMUNITY)
Admission: EM | Admit: 2020-12-23 | Discharge: 2020-12-24 | Disposition: A | Discharge status: Home / Self Care | Payer: 59 | Attending: Emergency Medicine | Admitting: Emergency Medicine

## 2020-12-23 ENCOUNTER — Other Ambulatory Visit: Payer: Self-pay

## 2020-12-23 ENCOUNTER — Emergency Department (HOSPITAL_COMMUNITY): Payer: 59

## 2020-12-23 ENCOUNTER — Encounter (HOSPITAL_COMMUNITY): Payer: Self-pay | Admitting: Emergency Medicine

## 2020-12-23 DIAGNOSIS — I10 Essential (primary) hypertension: Secondary | ICD-10-CM | POA: Insufficient documentation

## 2020-12-23 DIAGNOSIS — N12 Tubulo-interstitial nephritis, not specified as acute or chronic: Secondary | ICD-10-CM | POA: Insufficient documentation

## 2020-12-23 DIAGNOSIS — R109 Unspecified abdominal pain: Secondary | ICD-10-CM | POA: Diagnosis present

## 2020-12-23 LAB — CBC WITH DIFFERENTIAL/PLATELET
Abs Immature Granulocytes: 0.04 10*3/uL (ref 0.00–0.07)
Basophils Absolute: 0 10*3/uL (ref 0.0–0.1)
Basophils Relative: 0 %
Eosinophils Absolute: 0 10*3/uL (ref 0.0–0.5)
Eosinophils Relative: 0 %
HCT: 37.1 % (ref 36.0–46.0)
Hemoglobin: 11.6 g/dL — ABNORMAL LOW (ref 12.0–15.0)
Immature Granulocytes: 0 %
Lymphocytes Relative: 25 %
Lymphs Abs: 2.5 10*3/uL (ref 0.7–4.0)
MCH: 24.9 pg — ABNORMAL LOW (ref 26.0–34.0)
MCHC: 31.3 g/dL (ref 30.0–36.0)
MCV: 79.6 fL — ABNORMAL LOW (ref 80.0–100.0)
Monocytes Absolute: 0.9 10*3/uL (ref 0.1–1.0)
Monocytes Relative: 9 %
Neutro Abs: 6.4 10*3/uL (ref 1.7–7.7)
Neutrophils Relative %: 66 %
Platelets: 344 10*3/uL (ref 150–400)
RBC: 4.66 MIL/uL (ref 3.87–5.11)
RDW: 16.1 % — ABNORMAL HIGH (ref 11.5–15.5)
WBC: 9.9 10*3/uL (ref 4.0–10.5)
nRBC: 0 % (ref 0.0–0.2)

## 2020-12-23 LAB — COMPREHENSIVE METABOLIC PANEL
ALT: 14 U/L (ref 0–44)
AST: 16 U/L (ref 15–41)
Albumin: 2.9 g/dL — ABNORMAL LOW (ref 3.5–5.0)
Alkaline Phosphatase: 91 U/L (ref 38–126)
Anion gap: 10 (ref 5–15)
BUN: 9 mg/dL (ref 6–20)
CO2: 25 mmol/L (ref 22–32)
Calcium: 8.7 mg/dL — ABNORMAL LOW (ref 8.9–10.3)
Chloride: 101 mmol/L (ref 98–111)
Creatinine, Ser: 1.16 mg/dL — ABNORMAL HIGH (ref 0.44–1.00)
GFR, Estimated: >60 mL/min (ref >60)
Glucose, Bld: 90 mg/dL (ref 70–99)
Potassium: 3.6 mmol/L (ref 3.5–5.1)
Sodium: 136 mmol/L (ref 135–145)
Total Bilirubin: 0.4 mg/dL (ref 0.3–1.2)
Total Protein: 7.9 g/dL (ref 6.5–8.1)

## 2020-12-23 LAB — I-STAT BETA HCG BLOOD, ED (MC, WL, AP ONLY): I-stat hCG, quantitative: <5 m[IU]/mL (ref <5)

## 2020-12-23 LAB — LIPASE, BLOOD: Lipase: 23 U/L (ref 11–51)

## 2020-12-23 MED ORDER — FENTANYL CITRATE (PF) 100 MCG/2ML IJ SOLN
50.0000 ug | Freq: Once | INTRAMUSCULAR | Status: AC
  Administered 2020-12-23: 50 ug via INTRAVENOUS
  Filled 2020-12-23: qty 2

## 2020-12-23 MED ORDER — SODIUM CHLORIDE 0.9 % IV BOLUS
1000.0000 mL | Freq: Once | INTRAVENOUS | Status: AC
  Administered 2020-12-23: 1000 mL via INTRAVENOUS

## 2020-12-23 MED ORDER — ONDANSETRON HCL 4 MG/2ML IJ SOLN
4.0000 mg | Freq: Once | INTRAMUSCULAR | Status: AC
  Administered 2020-12-23: 4 mg via INTRAVENOUS
  Filled 2020-12-23: qty 2

## 2020-12-23 NOTE — ED Provider Notes (Signed)
[place] EMERGENCY DEPARTMENT Provider Note   CSN: [id number] Arrival date & time: 12/23/20  1706     History Chief Complaint  Patient presents with  . Abdominal Pain    JAIMY KLIETHERMES is a 35 y.o. female.  Patient to ED for evaluation of abdominal pain for the past 4 days associated with nausea and vomiting. The pain is RUQ and LUQ. It has been waxing/waning but constant. She reports fever that started today. No constipation or diarrhea. No urinary symptoms. She denies shortness of breath but reports her abdominal pain increases with deep breath. No cough or congestion. No association with eating/drinking. She reports she's been able to tolerate water but no solids without vomiting.   The history is provided by the patient. No language interpreter was used.  Abdominal Pain Associated symptoms: chills, fever, nausea and vomiting   Associated symptoms: no chest pain, no constipation, no cough, no diarrhea, no dysuria and no shortness of breath        Past Medical History:  Diagnosis Date  . Anemia   . Anemia   . Chlamydia   . Hypertension   . Pregnancy induced hypertension 07/14/2014  . Sinus headache   . Syphilis   . Trichomonas     Patient Active Problem List   Diagnosis Date Noted  . Pelvic pain 03/28/2020  . Subserous leiomyoma of uterus 03/28/2020  . Depo-Provera contraceptive status 03/28/2020    Past Surgical History:  Procedure Laterality Date  . ABSCESS DRAINAGE     arms  . DILATION AND EVACUATION  07/16/2012   Procedure: DILATATION AND EVACUATION;  Surgeon: Osborne Oman, MD;  Location: [facility];  Service: Gynecology;  Laterality: N/A;     OB History    Gravida  4   Para  3   Term  3   Preterm      AB  1   Living  2     SAB  1   IAB      Ectopic      Multiple  0   Live Births  2           Family History  Problem Relation Age of Onset  . Breast cancer Mother   . Diabetes Mother   . Cancer Father      Social History   Tobacco Use  . Smoking status: Never Smoker  . Smokeless tobacco: Never Used  Vaping Use  . Vaping Use: Never used  Substance Use Topics  . Alcohol use: No  . Drug use: No    Home Medications Prior to Admission medications   Medication Sig Start Date End Date Taking? Authorizing Provider  ferrous sulfate (FERROUSUL) 325 (65 FE) MG tablet Take 1 tablet (325 mg total) by mouth 2 (two) times daily. 06/28/14   Karim-Rhoades, Dara Lords, CNM  ibuprofen (ADVIL) 800 MG tablet Take 1 tablet (800 mg total) by mouth every 8 (eight) hours as needed. 04/01/20   Rubie Maid, MD  medroxyPROGESTERone (DEPO-PROVERA) 150 MG/ML injection Inject 1 mL (150 mg total) into the muscle every 3 (three) months. 06/22/20   Rubie Maid, MD  oxyCODONE-acetaminophen (PERCOCET/ROXICET) 5-325 MG tablet Take 1-2 tablets by mouth every 6 (six) hours as needed. Patient not taking: Reported on 04/27/2020 03/24/20   Rubie Maid, MD    Allergies    Coconut oil and Shellfish allergy  Review of Systems   Review of Systems  Constitutional: Positive for activity change, chills and fever.  HENT: Negative.   Respiratory: Negative for cough and shortness of breath.   Cardiovascular: Negative for chest pain.  Gastrointestinal: Positive for abdominal pain, nausea and vomiting. Negative for constipation and diarrhea.  Genitourinary: Negative for dysuria.  Musculoskeletal: Negative for myalgias.  Neurological: Positive for weakness (Feels generally weak).    Physical Exam Updated Vital Signs BP 127/82 (BP Location: Right Arm)   Pulse (!) 105   Temp 99.1 F (37.3 C) (Oral)   Resp (!) 22   Ht 5\' 6"  (1.676 m)   Wt (!) 137 kg   LMP 05/23/2020   SpO2 97%   BMI 48.74 kg/m   Physical Exam Vitals and nursing note reviewed.  Constitutional:      Appearance: She is well-developed. She is obese. She is not ill-appearing.  HENT:     Head: Normocephalic.  Cardiovascular:     Rate and Rhythm: Normal  rate and regular rhythm.  Pulmonary:     Effort: Pulmonary effort is normal.     Breath sounds: Normal breath sounds.  Abdominal:     General: Bowel sounds are decreased.     Palpations: Abdomen is soft.     Tenderness: There is abdominal tenderness in the right upper quadrant and left upper quadrant. There is guarding. There is no rebound.    Musculoskeletal:        General: Normal range of motion.     Cervical back: Normal range of motion and neck supple.  Skin:    General: Skin is warm and dry.     Findings: No rash.  Neurological:     Mental Status: She is alert.     Cranial Nerves: No cranial nerve deficit.     ED Results / Procedures / Treatments   Labs (all labs ordered are listed, but only abnormal results are displayed) Labs Reviewed  COMPREHENSIVE METABOLIC PANEL - Abnormal; Notable for the following components:      Result Value   Creatinine, Ser 1.16 (*)    Calcium 8.7 (*)    Albumin 2.9 (*)    All other components within normal limits  CBC WITH DIFFERENTIAL/PLATELET - Abnormal; Notable for the following components:   Hemoglobin 11.6 (*)    MCV 79.6 (*)    MCH 24.9 (*)    RDW 16.1 (*)    All other components within normal limits  LIPASE, BLOOD  I-STAT BETA HCG BLOOD, ED ([facility], [facility], [facility] ONLY)   Results for orders placed or performed during the hospital encounter of 12/23/20  Comprehensive metabolic panel  Result Value Ref Range   Sodium 136 135 - 145 mmol/L   Potassium 3.6 3.5 - 5.1 mmol/L   Chloride 101 98 - 111 mmol/L   CO2 25 22 - 32 mmol/L   Glucose, Bld 90 70 - 99 mg/dL   BUN 9 6 - 20 mg/dL   Creatinine, Ser 1.16 (H) 0.44 - 1.00 mg/dL   Calcium 8.7 (L) 8.9 - 10.3 mg/dL   Total Protein 7.9 6.5 - 8.1 g/dL   Albumin 2.9 (L) 3.5 - 5.0 g/dL   AST 16 15 - 41 U/L   ALT 14 0 - 44 U/L   Alkaline Phosphatase 91 38 - 126 U/L   Total Bilirubin 0.4 0.3 - 1.2 mg/dL   GFR, Estimated >60 >60 mL/min   Anion gap 10 5 - 15  Lipase, blood  Result Value Ref Range    Lipase 23 11 - 51 U/L  CBC with Differential  Result Value  Ref Range   WBC 9.9 4.0 - 10.5 K/uL   RBC 4.66 3.87 - 5.11 MIL/uL   Hemoglobin 11.6 (L) 12.0 - 15.0 g/dL   HCT 37.1 36.0 - 46.0 %   MCV 79.6 (L) 80.0 - 100.0 fL   MCH 24.9 (L) 26.0 - 34.0 pg   MCHC 31.3 30.0 - 36.0 g/dL   RDW 16.1 (H) 11.5 - 15.5 %   Platelets 344 150 - 400 K/uL   nRBC 0.0 0.0 - 0.2 %   Neutrophils Relative % 66 %   Neutro Abs 6.4 1.7 - 7.7 K/uL   Lymphocytes Relative 25 %   Lymphs Abs 2.5 0.7 - 4.0 K/uL   Monocytes Relative 9 %   Monocytes Absolute 0.9 0.1 - 1.0 K/uL   Eosinophils Relative 0 %   Eosinophils Absolute 0.0 0.0 - 0.5 K/uL   Basophils Relative 0 %   Basophils Absolute 0.0 0.0 - 0.1 K/uL   Immature Granulocytes 0 %   Abs Immature Granulocytes 0.04 0.00 - 0.07 K/uL  Urinalysis, Routine w reflex microscopic  Result Value Ref Range   Color, Urine AMBER (A) YELLOW   APPearance CLOUDY (A) CLEAR   Specific Gravity, Urine 1.016 1.005 - 1.030   pH 5.0 5.0 - 8.0   Glucose, UA NEGATIVE NEGATIVE mg/dL   Hgb urine dipstick MODERATE (A) NEGATIVE   Bilirubin Urine NEGATIVE NEGATIVE   Ketones, ur NEGATIVE NEGATIVE mg/dL   Protein, ur 30 (A) NEGATIVE mg/dL   Nitrite POSITIVE (A) NEGATIVE   Leukocytes,Ua LARGE (A) NEGATIVE   RBC / HPF 21-50 0 - 5 RBC/hpf   WBC, UA >50 (H) 0 - 5 WBC/hpf   Bacteria, UA MANY (A) NONE SEEN   Squamous Epithelial / LPF 0-5 0 - 5   Mucus PRESENT   I-Stat beta hCG blood, ED  Result Value Ref Range   I-stat hCG, quantitative <5.0 <5 mIU/mL   Comment 3             EKG None  Radiology No results found. CT ABDOMEN PELVIS WO CONTRAST  Result Date: 12/24/2020 CLINICAL DATA:  Abdomen pain with fever EXAM: CT ABDOMEN AND PELVIS WITHOUT CONTRAST TECHNIQUE: Multidetector CT imaging of the abdomen and pelvis was performed following the standard protocol without IV contrast. COMPARISON:  02/02/2011 FINDINGS: Lower chest: No acute abnormality. Hepatobiliary: No focal liver  abnormality is seen. No gallstones, gallbladder wall thickening, or biliary dilatation. Pancreas: Unremarkable. No pancreatic ductal dilatation or surrounding inflammatory changes. Spleen: Normal in size without focal abnormality. Adrenals/Urinary Tract: Adrenal glands are normal. Subtle right perinephric fat stranding most evident at the lower pole. No hydronephrosis. No ureteral stone. The bladder is normal Stomach/Bowel: Stomach is within normal limits. Appendix appears normal. No evidence of bowel wall thickening, distention, or inflammatory changes. Vascular/Lymphatic: No significant vascular findings are present. No enlarged abdominal or pelvic lymph nodes. Reproductive: Uterus and bilateral adnexa are unremarkable. Other: No abdominal wall hernia or abnormality. No abdominopelvic ascites. Musculoskeletal: No acute or significant osseous findings. IMPRESSION: 1. Subtle perinephric fat stranding on the right, nonspecific but could be seen with pyelonephritis. Recommend correlation with urinalysis. Negative for hydronephrosis. 2. Otherwise no CT evidence for acute intra-abdominal or pelvic abnormality Electronically Signed   By: Donavan Foil M.D.   On: 12/24/2020 00:38    Procedures Procedures   Medications Ordered in ED Medications  fentaNYL (SUBLIMAZE) injection 50 mcg (has no administration in time range)  ondansetron (ZOFRAN) injection 4 mg (has no administration in time  range)    ED Course  I have reviewed the triage vital signs and the nursing notes.  Pertinent labs & imaging results that were available during my care of the patient were reviewed by me and considered in my medical decision making (see chart for details).    MDM Rules/Calculators/A&P                          Patient to ED with abdominal pain, N, V x 3 days, fever today.   Labs unremarkable. CT ordered for further evaluation and raise concern for pyelonephritis with mild perinephric stranding. No other finds. UA  ordered.   UA c/w infection. No white count, VSS, patient is nontoxic. Will start antibiotics for pyelonephritis. She is felt appropriate for discharge home. Return precautions discussed.   Final Clinical Impression(s) / ED Diagnoses Final diagnoses:  None   1. Pyelonephritis  Rx / DC Orders ED Discharge Orders    None       Charlann Lange, PA-C 12/25/20 3435    Veryl Speak, MD 12/29/20 2302

## 2020-12-23 NOTE — ED Triage Notes (Signed)
Pt reports generalized abd pain that started Monday that radiates to her back. Pt denies any urinary sx. Denies n/v/d. Pt reports pain is worse with movement and with eating/drinking. Also with chills and fever of 102.46F, pt took aspirin before arrival. No fever in triage at this time.

## 2020-12-23 NOTE — ED Provider Notes (Signed)
Emergency Medicine Provider Triage Evaluation Note  LACIE LANDRY , a 35 y.o. female  was evaluated in triage.  Pt complains of abdominal pain for the past 4 days.  Reports decreased appetite and vomiting but denies any changes to bowel movements or urination.  No sick contacts with similar symptoms.  Review of Systems  Positive: Abdominal pain, vomiting Negative: Urinary changes diarrhea or constipation  Physical Exam  BP 123/89 (BP Location: Left Arm)   Pulse (!) 102   Temp 99.9 F (37.7 C) (Oral)   Resp 18   SpO2 100%  Gen:   Awake, no distress Resp:  Normal effort MSK:   Moves extremities without difficulty Other:  Abdomen generally tender  Medical Decision Making  Medically screening exam initiated at 5:46 PM.  Appropriate orders placed.  [name] was informed that the remainder of the evaluation will be completed by another provider, this initial triage assessment does not replace that evaluation, and the importance of remaining in the ED until their evaluation is complete.  Blood work ordered   Delia Heady, PA-C 12/23/20 1747    Blanchie Dessert, MD 12/24/20 [phone]

## 2020-12-24 ENCOUNTER — Emergency Department (HOSPITAL_COMMUNITY): Payer: 59

## 2020-12-24 LAB — URINALYSIS, ROUTINE W REFLEX MICROSCOPIC
Bilirubin Urine: NEGATIVE
Glucose, UA: NEGATIVE mg/dL
Ketones, ur: NEGATIVE mg/dL
Nitrite: POSITIVE — AB
Protein, ur: 30 mg/dL — AB
Specific Gravity, Urine: 1.016 (ref 1.005–1.030)
WBC, UA: >50 WBC/hpf — ABNORMAL HIGH (ref 0–5)
pH: 5 (ref 5.0–8.0)

## 2020-12-24 MED ORDER — CEPHALEXIN 500 MG PO CAPS: 500.0000 mg | ORAL_CAPSULE | Freq: Four times a day (QID) | ORAL | 0 refills | Status: DC

## 2020-12-24 MED ORDER — OXYCODONE-ACETAMINOPHEN 5-325 MG PO TABS: 1.0000 | ORAL_TABLET | ORAL | 0 refills | Status: DC | PRN

## 2020-12-24 MED ORDER — ONDANSETRON 4 MG PO TBDP: 4.0000 mg | ORAL_TABLET | Freq: Three times a day (TID) | ORAL | 0 refills | Status: DC | PRN

## 2020-12-24 MED ORDER — SODIUM CHLORIDE 0.9 % IV SOLN
1.0000 g | Freq: Once | INTRAVENOUS | Status: AC
  Administered 2020-12-24: 1 g via INTRAVENOUS
  Filled 2020-12-24: qty 10

## 2020-12-24 NOTE — ED Notes (Signed)
Patient verbalizes understanding of discharge instructions. Prescriptions and pain management reviewed. Opportunity for questioning and answers were provided. Armband removed by staff, pt discharged from ED ambulatory.

## 2020-12-24 NOTE — Discharge Instructions (Signed)
Drink lots of water/fluids to avoid dehydration. It is important to take your antibiotic as prescribed until gone. Take Percocet for pain as needed, you can add ibuprofen if needed. Zofran for nausea.

## 2021-06-11 ENCOUNTER — Encounter: Payer: Self-pay | Admitting: Obstetrics and Gynecology

## 2022-12-08 DIAGNOSIS — Z30013 Encounter for initial prescription of injectable contraceptive: Secondary | ICD-10-CM | POA: Diagnosis not present

## 2022-12-08 DIAGNOSIS — Z3009 Encounter for other general counseling and advice on contraception: Secondary | ICD-10-CM | POA: Diagnosis not present

## 2022-12-08 DIAGNOSIS — L732 Hidradenitis suppurativa: Secondary | ICD-10-CM | POA: Diagnosis not present

## 2022-12-08 DIAGNOSIS — Z3202 Encounter for pregnancy test, result negative: Secondary | ICD-10-CM | POA: Diagnosis not present

## 2022-12-08 DIAGNOSIS — Z130 Encounter for screening for diseases of the blood and blood-forming organs and certain disorders involving the immune mechanism: Secondary | ICD-10-CM | POA: Diagnosis not present

## 2022-12-08 DIAGNOSIS — Z91410 Personal history of adult physical and sexual abuse: Secondary | ICD-10-CM | POA: Diagnosis not present

## 2022-12-21 DIAGNOSIS — F4310 Post-traumatic stress disorder, unspecified: Secondary | ICD-10-CM | POA: Diagnosis not present

## 2022-12-21 DIAGNOSIS — F4323 Adjustment disorder with mixed anxiety and depressed mood: Secondary | ICD-10-CM | POA: Diagnosis not present

## 2022-12-21 DIAGNOSIS — Z62820 Parent-biological child conflict: Secondary | ICD-10-CM | POA: Diagnosis not present

## 2022-12-27 DIAGNOSIS — Z62820 Parent-biological child conflict: Secondary | ICD-10-CM | POA: Diagnosis not present

## 2022-12-27 DIAGNOSIS — F4323 Adjustment disorder with mixed anxiety and depressed mood: Secondary | ICD-10-CM | POA: Diagnosis not present

## 2022-12-27 DIAGNOSIS — F4310 Post-traumatic stress disorder, unspecified: Secondary | ICD-10-CM | POA: Diagnosis not present

## 2022-12-28 ENCOUNTER — Ambulatory Visit: Payer: 59 | Admitting: Nurse Practitioner

## 2022-12-29 DIAGNOSIS — F4323 Adjustment disorder with mixed anxiety and depressed mood: Secondary | ICD-10-CM | POA: Diagnosis not present

## 2022-12-29 DIAGNOSIS — F4310 Post-traumatic stress disorder, unspecified: Secondary | ICD-10-CM | POA: Diagnosis not present

## 2022-12-29 DIAGNOSIS — Z62820 Parent-biological child conflict: Secondary | ICD-10-CM | POA: Diagnosis not present

## 2023-01-04 DIAGNOSIS — F4323 Adjustment disorder with mixed anxiety and depressed mood: Secondary | ICD-10-CM | POA: Diagnosis not present

## 2023-01-04 DIAGNOSIS — Z62820 Parent-biological child conflict: Secondary | ICD-10-CM | POA: Diagnosis not present

## 2023-01-04 DIAGNOSIS — F4310 Post-traumatic stress disorder, unspecified: Secondary | ICD-10-CM | POA: Diagnosis not present

## 2023-01-05 DIAGNOSIS — F4310 Post-traumatic stress disorder, unspecified: Secondary | ICD-10-CM | POA: Diagnosis not present

## 2023-01-05 DIAGNOSIS — Z62820 Parent-biological child conflict: Secondary | ICD-10-CM | POA: Diagnosis not present

## 2023-01-05 DIAGNOSIS — F4323 Adjustment disorder with mixed anxiety and depressed mood: Secondary | ICD-10-CM | POA: Diagnosis not present

## 2023-01-10 DIAGNOSIS — Z62820 Parent-biological child conflict: Secondary | ICD-10-CM | POA: Diagnosis not present

## 2023-01-10 DIAGNOSIS — F4310 Post-traumatic stress disorder, unspecified: Secondary | ICD-10-CM | POA: Diagnosis not present

## 2023-01-10 DIAGNOSIS — F4323 Adjustment disorder with mixed anxiety and depressed mood: Secondary | ICD-10-CM | POA: Diagnosis not present

## 2023-01-11 DIAGNOSIS — Z62820 Parent-biological child conflict: Secondary | ICD-10-CM | POA: Diagnosis not present

## 2023-01-11 DIAGNOSIS — F4323 Adjustment disorder with mixed anxiety and depressed mood: Secondary | ICD-10-CM | POA: Diagnosis not present

## 2023-01-11 DIAGNOSIS — F4310 Post-traumatic stress disorder, unspecified: Secondary | ICD-10-CM | POA: Diagnosis not present

## 2023-01-17 DIAGNOSIS — Z62820 Parent-biological child conflict: Secondary | ICD-10-CM | POA: Diagnosis not present

## 2023-01-17 DIAGNOSIS — F4310 Post-traumatic stress disorder, unspecified: Secondary | ICD-10-CM | POA: Diagnosis not present

## 2023-01-17 DIAGNOSIS — F4323 Adjustment disorder with mixed anxiety and depressed mood: Secondary | ICD-10-CM | POA: Diagnosis not present

## 2023-01-18 DIAGNOSIS — F4310 Post-traumatic stress disorder, unspecified: Secondary | ICD-10-CM | POA: Diagnosis not present

## 2023-01-18 DIAGNOSIS — Z62820 Parent-biological child conflict: Secondary | ICD-10-CM | POA: Diagnosis not present

## 2023-01-18 DIAGNOSIS — F4323 Adjustment disorder with mixed anxiety and depressed mood: Secondary | ICD-10-CM | POA: Diagnosis not present

## 2023-01-23 DIAGNOSIS — F4310 Post-traumatic stress disorder, unspecified: Secondary | ICD-10-CM | POA: Diagnosis not present

## 2023-01-23 DIAGNOSIS — Z62820 Parent-biological child conflict: Secondary | ICD-10-CM | POA: Diagnosis not present

## 2023-01-23 DIAGNOSIS — F4323 Adjustment disorder with mixed anxiety and depressed mood: Secondary | ICD-10-CM | POA: Diagnosis not present

## 2023-01-24 DIAGNOSIS — Z62820 Parent-biological child conflict: Secondary | ICD-10-CM | POA: Diagnosis not present

## 2023-01-24 DIAGNOSIS — F4323 Adjustment disorder with mixed anxiety and depressed mood: Secondary | ICD-10-CM | POA: Diagnosis not present

## 2023-01-24 DIAGNOSIS — F4310 Post-traumatic stress disorder, unspecified: Secondary | ICD-10-CM | POA: Diagnosis not present

## 2023-01-31 DIAGNOSIS — F4310 Post-traumatic stress disorder, unspecified: Secondary | ICD-10-CM | POA: Diagnosis not present

## 2023-01-31 DIAGNOSIS — F4323 Adjustment disorder with mixed anxiety and depressed mood: Secondary | ICD-10-CM | POA: Diagnosis not present

## 2023-01-31 DIAGNOSIS — Z62820 Parent-biological child conflict: Secondary | ICD-10-CM | POA: Diagnosis not present

## 2023-02-01 ENCOUNTER — Ambulatory Visit: Payer: 59 | Admitting: Nurse Practitioner

## 2023-02-07 DIAGNOSIS — F4310 Post-traumatic stress disorder, unspecified: Secondary | ICD-10-CM | POA: Diagnosis not present

## 2023-02-07 DIAGNOSIS — Z62820 Parent-biological child conflict: Secondary | ICD-10-CM | POA: Diagnosis not present

## 2023-02-07 DIAGNOSIS — F4323 Adjustment disorder with mixed anxiety and depressed mood: Secondary | ICD-10-CM | POA: Diagnosis not present

## 2023-02-14 DIAGNOSIS — F4310 Post-traumatic stress disorder, unspecified: Secondary | ICD-10-CM | POA: Diagnosis not present

## 2023-02-14 DIAGNOSIS — F4323 Adjustment disorder with mixed anxiety and depressed mood: Secondary | ICD-10-CM | POA: Diagnosis not present

## 2023-02-14 DIAGNOSIS — Z62820 Parent-biological child conflict: Secondary | ICD-10-CM | POA: Diagnosis not present

## 2023-02-21 DIAGNOSIS — F4310 Post-traumatic stress disorder, unspecified: Secondary | ICD-10-CM | POA: Diagnosis not present

## 2023-02-21 DIAGNOSIS — Z62820 Parent-biological child conflict: Secondary | ICD-10-CM | POA: Diagnosis not present

## 2023-02-21 DIAGNOSIS — F4323 Adjustment disorder with mixed anxiety and depressed mood: Secondary | ICD-10-CM | POA: Diagnosis not present

## 2023-03-02 DIAGNOSIS — Z62820 Parent-biological child conflict: Secondary | ICD-10-CM | POA: Diagnosis not present

## 2023-03-02 DIAGNOSIS — F4310 Post-traumatic stress disorder, unspecified: Secondary | ICD-10-CM | POA: Diagnosis not present

## 2023-03-02 DIAGNOSIS — F4323 Adjustment disorder with mixed anxiety and depressed mood: Secondary | ICD-10-CM | POA: Diagnosis not present

## 2023-03-07 DIAGNOSIS — Z62820 Parent-biological child conflict: Secondary | ICD-10-CM | POA: Diagnosis not present

## 2023-03-07 DIAGNOSIS — F4323 Adjustment disorder with mixed anxiety and depressed mood: Secondary | ICD-10-CM | POA: Diagnosis not present

## 2023-03-07 DIAGNOSIS — F4310 Post-traumatic stress disorder, unspecified: Secondary | ICD-10-CM | POA: Diagnosis not present

## 2023-03-15 DIAGNOSIS — F4323 Adjustment disorder with mixed anxiety and depressed mood: Secondary | ICD-10-CM | POA: Diagnosis not present

## 2023-03-15 DIAGNOSIS — F4310 Post-traumatic stress disorder, unspecified: Secondary | ICD-10-CM | POA: Diagnosis not present

## 2023-03-15 DIAGNOSIS — Z62820 Parent-biological child conflict: Secondary | ICD-10-CM | POA: Diagnosis not present

## 2023-04-04 DIAGNOSIS — F4310 Post-traumatic stress disorder, unspecified: Secondary | ICD-10-CM | POA: Diagnosis not present

## 2023-04-04 DIAGNOSIS — Z62820 Parent-biological child conflict: Secondary | ICD-10-CM | POA: Diagnosis not present

## 2023-04-04 DIAGNOSIS — F4323 Adjustment disorder with mixed anxiety and depressed mood: Secondary | ICD-10-CM | POA: Diagnosis not present

## 2023-04-12 DIAGNOSIS — F4323 Adjustment disorder with mixed anxiety and depressed mood: Secondary | ICD-10-CM | POA: Diagnosis not present

## 2023-04-12 DIAGNOSIS — Z62820 Parent-biological child conflict: Secondary | ICD-10-CM | POA: Diagnosis not present

## 2023-04-12 DIAGNOSIS — F4310 Post-traumatic stress disorder, unspecified: Secondary | ICD-10-CM | POA: Diagnosis not present

## 2023-04-27 DIAGNOSIS — F4310 Post-traumatic stress disorder, unspecified: Secondary | ICD-10-CM | POA: Diagnosis not present

## 2023-04-27 DIAGNOSIS — F4323 Adjustment disorder with mixed anxiety and depressed mood: Secondary | ICD-10-CM | POA: Diagnosis not present

## 2023-04-27 DIAGNOSIS — Z62820 Parent-biological child conflict: Secondary | ICD-10-CM | POA: Diagnosis not present

## 2023-05-03 DIAGNOSIS — F4323 Adjustment disorder with mixed anxiety and depressed mood: Secondary | ICD-10-CM | POA: Diagnosis not present

## 2023-05-03 DIAGNOSIS — Z62820 Parent-biological child conflict: Secondary | ICD-10-CM | POA: Diagnosis not present

## 2023-05-03 DIAGNOSIS — F4310 Post-traumatic stress disorder, unspecified: Secondary | ICD-10-CM | POA: Diagnosis not present

## 2023-05-10 DIAGNOSIS — Z62820 Parent-biological child conflict: Secondary | ICD-10-CM | POA: Diagnosis not present

## 2023-05-10 DIAGNOSIS — F4323 Adjustment disorder with mixed anxiety and depressed mood: Secondary | ICD-10-CM | POA: Diagnosis not present

## 2023-05-10 DIAGNOSIS — F4310 Post-traumatic stress disorder, unspecified: Secondary | ICD-10-CM | POA: Diagnosis not present

## 2023-05-17 DIAGNOSIS — Z62820 Parent-biological child conflict: Secondary | ICD-10-CM | POA: Diagnosis not present

## 2023-05-17 DIAGNOSIS — F4323 Adjustment disorder with mixed anxiety and depressed mood: Secondary | ICD-10-CM | POA: Diagnosis not present

## 2023-05-17 DIAGNOSIS — F4310 Post-traumatic stress disorder, unspecified: Secondary | ICD-10-CM | POA: Diagnosis not present

## 2023-05-24 DIAGNOSIS — F4310 Post-traumatic stress disorder, unspecified: Secondary | ICD-10-CM | POA: Diagnosis not present

## 2023-05-24 DIAGNOSIS — F4323 Adjustment disorder with mixed anxiety and depressed mood: Secondary | ICD-10-CM | POA: Diagnosis not present

## 2023-05-24 DIAGNOSIS — Z62820 Parent-biological child conflict: Secondary | ICD-10-CM | POA: Diagnosis not present

## 2023-05-31 DIAGNOSIS — F4310 Post-traumatic stress disorder, unspecified: Secondary | ICD-10-CM | POA: Diagnosis not present

## 2023-05-31 DIAGNOSIS — Z62820 Parent-biological child conflict: Secondary | ICD-10-CM | POA: Diagnosis not present

## 2023-05-31 DIAGNOSIS — F4323 Adjustment disorder with mixed anxiety and depressed mood: Secondary | ICD-10-CM | POA: Diagnosis not present

## 2023-06-28 DIAGNOSIS — Z62820 Parent-biological child conflict: Secondary | ICD-10-CM | POA: Diagnosis not present

## 2023-06-28 DIAGNOSIS — F4310 Post-traumatic stress disorder, unspecified: Secondary | ICD-10-CM | POA: Diagnosis not present

## 2023-06-28 DIAGNOSIS — F4323 Adjustment disorder with mixed anxiety and depressed mood: Secondary | ICD-10-CM | POA: Diagnosis not present

## 2023-07-05 DIAGNOSIS — F4310 Post-traumatic stress disorder, unspecified: Secondary | ICD-10-CM | POA: Diagnosis not present

## 2023-07-05 DIAGNOSIS — F4323 Adjustment disorder with mixed anxiety and depressed mood: Secondary | ICD-10-CM | POA: Diagnosis not present

## 2023-07-05 DIAGNOSIS — Z62820 Parent-biological child conflict: Secondary | ICD-10-CM | POA: Diagnosis not present

## 2023-07-24 DIAGNOSIS — F4323 Adjustment disorder with mixed anxiety and depressed mood: Secondary | ICD-10-CM | POA: Diagnosis not present

## 2023-07-24 DIAGNOSIS — F4310 Post-traumatic stress disorder, unspecified: Secondary | ICD-10-CM | POA: Diagnosis not present

## 2023-07-24 DIAGNOSIS — Z62820 Parent-biological child conflict: Secondary | ICD-10-CM | POA: Diagnosis not present

## 2023-08-02 DIAGNOSIS — M545 Low back pain, unspecified: Secondary | ICD-10-CM | POA: Diagnosis not present

## 2023-08-10 DIAGNOSIS — F4323 Adjustment disorder with mixed anxiety and depressed mood: Secondary | ICD-10-CM | POA: Diagnosis not present

## 2023-08-10 DIAGNOSIS — Z62820 Parent-biological child conflict: Secondary | ICD-10-CM | POA: Diagnosis not present

## 2023-08-10 DIAGNOSIS — F4310 Post-traumatic stress disorder, unspecified: Secondary | ICD-10-CM | POA: Diagnosis not present

## 2023-08-16 DIAGNOSIS — F4323 Adjustment disorder with mixed anxiety and depressed mood: Secondary | ICD-10-CM | POA: Diagnosis not present

## 2023-08-16 DIAGNOSIS — F4310 Post-traumatic stress disorder, unspecified: Secondary | ICD-10-CM | POA: Diagnosis not present

## 2023-08-16 DIAGNOSIS — Z62820 Parent-biological child conflict: Secondary | ICD-10-CM | POA: Diagnosis not present

## 2023-08-21 DIAGNOSIS — F4323 Adjustment disorder with mixed anxiety and depressed mood: Secondary | ICD-10-CM | POA: Diagnosis not present

## 2023-08-21 DIAGNOSIS — F4310 Post-traumatic stress disorder, unspecified: Secondary | ICD-10-CM | POA: Diagnosis not present

## 2023-08-21 DIAGNOSIS — Z62820 Parent-biological child conflict: Secondary | ICD-10-CM | POA: Diagnosis not present

## 2023-08-30 DIAGNOSIS — F4310 Post-traumatic stress disorder, unspecified: Secondary | ICD-10-CM | POA: Diagnosis not present

## 2023-08-30 DIAGNOSIS — F4323 Adjustment disorder with mixed anxiety and depressed mood: Secondary | ICD-10-CM | POA: Diagnosis not present

## 2023-08-30 DIAGNOSIS — Z62820 Parent-biological child conflict: Secondary | ICD-10-CM | POA: Diagnosis not present

## 2023-09-12 DIAGNOSIS — F4310 Post-traumatic stress disorder, unspecified: Secondary | ICD-10-CM | POA: Diagnosis not present

## 2023-09-12 DIAGNOSIS — F4323 Adjustment disorder with mixed anxiety and depressed mood: Secondary | ICD-10-CM | POA: Diagnosis not present

## 2023-09-12 DIAGNOSIS — Z62820 Parent-biological child conflict: Secondary | ICD-10-CM | POA: Diagnosis not present

## 2023-09-28 DIAGNOSIS — F4323 Adjustment disorder with mixed anxiety and depressed mood: Secondary | ICD-10-CM | POA: Diagnosis not present

## 2023-09-28 DIAGNOSIS — F4310 Post-traumatic stress disorder, unspecified: Secondary | ICD-10-CM | POA: Diagnosis not present

## 2023-09-28 DIAGNOSIS — Z62820 Parent-biological child conflict: Secondary | ICD-10-CM | POA: Diagnosis not present

## 2023-10-04 DIAGNOSIS — F4323 Adjustment disorder with mixed anxiety and depressed mood: Secondary | ICD-10-CM | POA: Diagnosis not present

## 2023-10-04 DIAGNOSIS — Z62820 Parent-biological child conflict: Secondary | ICD-10-CM | POA: Diagnosis not present

## 2023-10-04 DIAGNOSIS — F4310 Post-traumatic stress disorder, unspecified: Secondary | ICD-10-CM | POA: Diagnosis not present

## 2023-10-09 DIAGNOSIS — F4310 Post-traumatic stress disorder, unspecified: Secondary | ICD-10-CM | POA: Diagnosis not present

## 2023-10-09 DIAGNOSIS — F4323 Adjustment disorder with mixed anxiety and depressed mood: Secondary | ICD-10-CM | POA: Diagnosis not present

## 2023-10-09 DIAGNOSIS — Z62820 Parent-biological child conflict: Secondary | ICD-10-CM | POA: Diagnosis not present

## 2023-10-16 DIAGNOSIS — F4310 Post-traumatic stress disorder, unspecified: Secondary | ICD-10-CM | POA: Diagnosis not present

## 2023-10-16 DIAGNOSIS — Z62820 Parent-biological child conflict: Secondary | ICD-10-CM | POA: Diagnosis not present

## 2023-10-16 DIAGNOSIS — F4323 Adjustment disorder with mixed anxiety and depressed mood: Secondary | ICD-10-CM | POA: Diagnosis not present

## 2023-10-17 ENCOUNTER — Ambulatory Visit: Payer: Medicaid Other | Admitting: General Practice

## 2024-04-02 ENCOUNTER — Ambulatory Visit (INDEPENDENT_AMBULATORY_CARE_PROVIDER_SITE_OTHER)

## 2024-04-02 VITALS — BP 124/72 | HR 98 | Resp 16 | Ht 66.0 in | Wt 349.0 lb

## 2024-04-02 DIAGNOSIS — Z136 Encounter for screening for cardiovascular disorders: Secondary | ICD-10-CM | POA: Diagnosis not present

## 2024-04-02 DIAGNOSIS — Z131 Encounter for screening for diabetes mellitus: Secondary | ICD-10-CM | POA: Diagnosis not present

## 2024-04-02 DIAGNOSIS — F419 Anxiety disorder, unspecified: Secondary | ICD-10-CM | POA: Insufficient documentation

## 2024-04-02 DIAGNOSIS — F322 Major depressive disorder, single episode, severe without psychotic features: Secondary | ICD-10-CM | POA: Insufficient documentation

## 2024-04-02 DIAGNOSIS — Z862 Personal history of diseases of the blood and blood-forming organs and certain disorders involving the immune mechanism: Secondary | ICD-10-CM | POA: Insufficient documentation

## 2024-04-02 DIAGNOSIS — F4310 Post-traumatic stress disorder, unspecified: Secondary | ICD-10-CM | POA: Insufficient documentation

## 2024-04-02 DIAGNOSIS — Z3009 Encounter for other general counseling and advice on contraception: Secondary | ICD-10-CM | POA: Diagnosis not present

## 2024-04-02 DIAGNOSIS — L732 Hidradenitis suppurativa: Secondary | ICD-10-CM | POA: Insufficient documentation

## 2024-04-02 MED ORDER — DOXYCYCLINE MONOHYDRATE 100 MG PO TABS: 100.0000 mg | ORAL_TABLET | Freq: Every day | ORAL | 3 refills | Status: DC

## 2024-04-02 MED ORDER — MEDROXYPROGESTERONE ACETATE 150 MG/ML IM SUSP
150.0000 mg | Freq: Once | INTRAMUSCULAR | Status: AC
  Administered 2024-04-02: 150 mg via INTRAMUSCULAR

## 2024-04-02 MED ORDER — ESCITALOPRAM OXALATE 10 MG PO TABS: 10.0000 mg | ORAL_TABLET | Freq: Every day | ORAL | 3 refills | Status: DC

## 2024-04-02 MED ORDER — PROPRANOLOL HCL 10 MG PO TABS: 10.0000 mg | ORAL_TABLET | Freq: Three times a day (TID) | ORAL | 2 refills | Status: AC | PRN

## 2024-04-02 NOTE — Progress Notes (Signed)
 Amber Sanders

## 2024-04-02 NOTE — Assessment & Plan Note (Signed)
 Previously on depo shot with minimal side effects. Would like to restart it. Received depo today, will follow up in 3 months for the next one.

## 2024-04-02 NOTE — Assessment & Plan Note (Signed)
 Chronic, uncontrolled. Patient with debilitating anxiety, intrusive thoughts, low mood, low energy, and difficulty sleeping. Concurrent GAD, PTSD. Seeing therapist weekly. Not currently taking any medications. - Will start lexapro  10mg  daily - Will start propranolol  10mg  TID PRN for panic attacks - Continue therapy - Follow up in 1 month     04/02/2024    3:26 PM 04/02/2024    3:25 PM  Depression screen PHQ 2/9  Decreased Interest 0 2  Down, Depressed, Hopeless 2 2  PHQ - 2 Score 2 4  Altered sleeping 2 2  Tired, decreased energy 2 3  Change in appetite 2 0  Feeling bad or failure about yourself  2 3  Trouble concentrating 1 2  Moving slowly or fidgety/restless 2 0  Suicidal thoughts 2 0  PHQ-9 Score 15 14

## 2024-04-02 NOTE — Assessment & Plan Note (Addendum)
 Patient with hx of IDA, not currently taking iron supplementation. Will recheck iron and CBC today, if low, will consider IV iron infusion. Will restart PO iron in the meantime.   Lab Results  Component Value Date   WBC 9.9 12/23/2020   HGB 11.6 (L) 12/23/2020   HCT 37.1 12/23/2020   MCV 79.6 (L) 12/23/2020   PLT 344 12/23/2020

## 2024-04-02 NOTE — Assessment & Plan Note (Addendum)
 Chronic, uncontrolled. Has nodules present in bilateral axilla, under breasts, and groin area. Previously took doxycyline which was helpful. - Will restart doxycycline  100mg  daily - Will refer to dermatology for further management
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
## 2024-04-02 NOTE — Assessment & Plan Note (Signed)
 BMI 56. Will return next visit to discuss weight loss medications.

## 2024-04-03 LAB — CBC
Hematocrit: 37.2 % (ref 34.0–46.6)
Hemoglobin: 11.3 g/dL (ref 11.1–15.9)
MCH: 24.9 pg — ABNORMAL LOW (ref 26.6–33.0)
MCHC: 30.4 g/dL — ABNORMAL LOW (ref 31.5–35.7)
MCV: 82 fL (ref 79–97)
Platelets: 415 x10E3/uL (ref 150–450)
RBC: 4.54 x10E6/uL (ref 3.77–5.28)
RDW: 14.9 % (ref 11.7–15.4)
WBC: 9.4 x10E3/uL (ref 3.4–10.8)

## 2024-04-03 LAB — HEMOGLOBIN A1C
Est. average glucose Bld gHb Est-mCnc: 126 mg/dL
Hgb A1c MFr Bld: 6 % — ABNORMAL HIGH (ref 4.8–5.6)

## 2024-04-03 LAB — IRON,TIBC AND FERRITIN PANEL
Ferritin: 63 ng/mL (ref 15–150)
Iron Saturation: 7 % — CL (ref 15–55)
Iron: 21 ug/dL — ABNORMAL LOW (ref 27–159)
Total Iron Binding Capacity: 288 ug/dL (ref 250–450)
UIBC: 267 ug/dL (ref 131–425)

## 2024-04-03 LAB — LIPID PANEL
Chol/HDL Ratio: 3.7 ratio (ref 0.0–4.4)
Cholesterol, Total: 144 mg/dL (ref 100–199)
HDL: 39 mg/dL — ABNORMAL LOW (ref >39)
LDL Chol Calc (NIH): 89 mg/dL (ref 0–99)
Triglycerides: 83 mg/dL (ref 0–149)
VLDL Cholesterol Cal: 16 mg/dL (ref 5–40)

## 2024-04-07 ENCOUNTER — Ambulatory Visit: Payer: Self-pay

## 2024-04-23 ENCOUNTER — Telehealth (INDEPENDENT_AMBULATORY_CARE_PROVIDER_SITE_OTHER)

## 2024-04-23 ENCOUNTER — Other Ambulatory Visit (HOSPITAL_COMMUNITY): Payer: Self-pay

## 2024-04-23 ENCOUNTER — Telehealth: Payer: Self-pay

## 2024-04-23 DIAGNOSIS — E611 Iron deficiency: Secondary | ICD-10-CM | POA: Diagnosis not present

## 2024-04-23 DIAGNOSIS — F322 Major depressive disorder, single episode, severe without psychotic features: Secondary | ICD-10-CM

## 2024-04-23 DIAGNOSIS — G4733 Obstructive sleep apnea (adult) (pediatric): Secondary | ICD-10-CM | POA: Insufficient documentation

## 2024-04-23 DIAGNOSIS — Z6841 Body Mass Index (BMI) 40.0 and over, adult: Secondary | ICD-10-CM

## 2024-04-23 DIAGNOSIS — R7303 Prediabetes: Secondary | ICD-10-CM | POA: Insufficient documentation

## 2024-04-23 MED ORDER — SEMAGLUTIDE-WEIGHT MANAGEMENT 0.25 MG/0.5ML ~~LOC~~ SOAJ: 0.2500 mg | SUBCUTANEOUS | 0 refills | Status: AC

## 2024-04-23 MED ORDER — SEMAGLUTIDE-WEIGHT MANAGEMENT 0.5 MG/0.5ML ~~LOC~~ SOAJ: 0.5000 mg | SUBCUTANEOUS | 0 refills | Status: DC

## 2024-04-23 NOTE — Progress Notes (Signed)
 MyChart Video Visit   Virtual Visit via Video Note   This format is felt to be most appropriate for this patient at this time. Physical exam was limited by quality of the video and audio technology used for the visit.    Patient location: home Provider location: [facility] Persons involved in the visit: patient, provider  I discussed the limitations of evaluation and management by telemedicine and the availability of in person appointments. The patient expressed understanding and agreed to proceed.  Patient: Amber Sanders   DOB: Apr 30, 1986   38 y.o. Female  MRN: [id number] Visit Date: 04/23/2024  Today's healthcare provider: Isaiah DELENA Pepper, MD   CC: follow up  Subjective    HPI:  Mood: - Doing well with lexapro , takes it in PM because was having nausea when taking it in the AM - Mood have been better with lexapro , reports that she has been less on edge - Has been walking on treadmill or going outside on her lunch break which has helped her mood - Has not need the propranolol  - Denies SI  IDA - Has been taking iron  Weight: - was doing yoga - likes to walk - trying to optimize diet - has not tried any medications to lose weight previously - denies family hx of thyroid  cancer, denies hx of pancreatitis  Snoring: - reports fatigue during the day  - was supposed to get sleep study done previously but did not complete  Review of systems as noted in HPI.      Objective    There were no vitals taken for this visit.      Physical Exam Constitutional:      Appearance: Normal appearance.  HENT:     Head: Normocephalic and atraumatic.  Pulmonary:     Effort: Pulmonary effort is normal.  Skin:    General: Skin is warm.  Neurological:     General: No focal deficit present.     Mental Status: She is alert.       Assessment & Plan     Problem List Items Addressed This Visit       Respiratory   OSA (obstructive sleep apnea)    Patient with hx of snoring, apneic episodes, and daytime fatigue concerning for OSA. STOP-BANG of 6. Sleep study previously ordered but did not complete. Will order home sleep study. Will start Wegovy as noted below.      Relevant Medications   semaglutide-weight management (WEGOVY) 0.25 MG/0.5ML SOAJ SQ injection   semaglutide-weight management (WEGOVY) 0.5 MG/0.5ML SOAJ SQ injection   Other Relevant Orders   Home sleep test     Other   Current severe episode of major depressive disorder without psychotic features without prior episode (HCC)   Chronic, improving since starting lexapro  10mg  daily. Has not needed to take propranolol  yet. Seeing therapist weekly. - Continue lexapro  10mg  daily - Continue propranolol  10mg  TID PRN for panic attacks - Continue therapy - Follow up in 1 month       Morbid obesity (HCC) - Primary   BMI 56. Patient has tried optimizing diet and exercise regimen without significant weight loss. Has not tried weight loss medication before. Co-morbidities include OSA and prediabetes. Would be a good candidate for a GLP-1. - Will start Wegovy, counseled on side effects and medication administration - Discussed eating a balanced diet and incorporating movement into daily routine. - Follow up in 1 month      Relevant Medications  semaglutide-weight management (WEGOVY) 0.25 MG/0.5ML SOAJ SQ injection   semaglutide-weight management (WEGOVY) 0.5 MG/0.5ML SOAJ SQ injection   Iron deficiency   Noted on labs done at last visit. Continue iron supplementation.      Prediabetes   Noted on labs at last visit. Discussed eating a balanced diet and incorporating movement into daily routine. Will start Wegovy as above.  Lab Results  Component Value Date   HGBA1C 6.0 (H) 04/02/2024         Relevant Medications   semaglutide-weight management (WEGOVY) 0.25 MG/0.5ML SOAJ SQ injection   semaglutide-weight management (WEGOVY) 0.5 MG/0.5ML SOAJ SQ injection      Meds ordered this encounter  Medications   semaglutide-weight management (WEGOVY) 0.25 MG/0.5ML SOAJ SQ injection    Sig: Inject 0.25 mg into the skin once a week for 28 days.    Dispense:  2 mL    Refill:  0   semaglutide-weight management (WEGOVY) 0.5 MG/0.5ML SOAJ SQ injection    Sig: Inject 0.5 mg into the skin once a week. Start after 0.25mg  dose.    Dispense:  2 mL    Refill:  0     No follow-ups on file.     I discussed the assessment and treatment plan with the patient. The patient was provided an opportunity to ask questions and all were answered. The patient agreed with the plan and demonstrated an understanding of the instructions.   The patient was advised to call back or seek an in-person evaluation if the symptoms worsen or if the condition fails to improve as anticipated.  Isaiah DELENA Pepper, MD [facility] [phone] (phone) [phone] (fax)

## 2024-04-23 NOTE — Assessment & Plan Note (Signed)
 Chronic, improving since starting lexapro  10mg  daily. Has not needed to take propranolol  yet. Seeing therapist weekly. - Continue lexapro  10mg  daily - Continue propranolol  10mg  TID PRN for panic attacks - Continue therapy - Follow up in 1 month

## 2024-04-23 NOTE — Assessment & Plan Note (Signed)
 Noted on labs done at last visit. Continue iron supplementation.

## 2024-04-23 NOTE — Telephone Encounter (Signed)
 Pharmacy Patient Advocate Encounter   Received notification from Onbase that prior authorization for [facility] 0.25MG /0.5ML auto-injectors  is required/requested.   Insurance verification completed.   The patient is insured through [facility].   Per test claim: PA required; PA submitted to above mentioned insurance via Latent Key/confirmation #/EOC [id number] Status is pending

## 2024-04-23 NOTE — Assessment & Plan Note (Signed)
 Noted on labs at last visit. Discussed eating a balanced diet and incorporating movement into daily routine. Will start Wegovy as above.  Lab Results  Component Value Date   HGBA1C 6.0 (H) 04/02/2024

## 2024-04-23 NOTE — Assessment & Plan Note (Signed)
 BMI 56. Patient has tried optimizing diet and exercise regimen without significant weight loss. Has not tried weight loss medication before. Co-morbidities include OSA and prediabetes. Would be a good candidate for a GLP-1. - Will start Wegovy, counseled on side effects and medication administration - Discussed eating a balanced diet and incorporating movement into daily routine. - Follow up in 1 month

## 2024-04-23 NOTE — Assessment & Plan Note (Signed)
 Patient with hx of snoring, apneic episodes, and daytime fatigue concerning for OSA. STOP-BANG of 6. Sleep study previously ordered but did not complete. Will order home sleep study. Will start Wegovy as noted below.

## 2024-04-24 ENCOUNTER — Other Ambulatory Visit: Payer: Self-pay

## 2024-04-24 DIAGNOSIS — F322 Major depressive disorder, single episode, severe without psychotic features: Secondary | ICD-10-CM

## 2024-04-24 NOTE — Telephone Encounter (Signed)
 Pharmacy Patient Advocate Encounter  Received notification from [facility] that Prior Authorization for Amber Sanders has been APPROVED from 04/23/2024 to 11/19/2024   PA #/Case ID/Reference #: [id number]

## 2024-04-25 ENCOUNTER — Other Ambulatory Visit (HOSPITAL_COMMUNITY): Payer: Self-pay

## 2024-05-21 ENCOUNTER — Other Ambulatory Visit: Payer: Self-pay | Admitting: Medical Genetics

## 2024-05-21 ENCOUNTER — Ambulatory Visit

## 2024-05-21 ENCOUNTER — Encounter: Payer: Self-pay | Admitting: Sleep Medicine

## 2024-05-28 ENCOUNTER — Ambulatory Visit (INDEPENDENT_AMBULATORY_CARE_PROVIDER_SITE_OTHER)

## 2024-05-28 DIAGNOSIS — R7303 Prediabetes: Secondary | ICD-10-CM | POA: Diagnosis not present

## 2024-05-28 DIAGNOSIS — E611 Iron deficiency: Secondary | ICD-10-CM

## 2024-05-28 DIAGNOSIS — F324 Major depressive disorder, single episode, in partial remission: Secondary | ICD-10-CM

## 2024-05-28 DIAGNOSIS — R600 Localized edema: Secondary | ICD-10-CM

## 2024-05-28 DIAGNOSIS — M5442 Lumbago with sciatica, left side: Secondary | ICD-10-CM

## 2024-05-28 DIAGNOSIS — G4733 Obstructive sleep apnea (adult) (pediatric): Secondary | ICD-10-CM

## 2024-05-28 DIAGNOSIS — G8929 Other chronic pain: Secondary | ICD-10-CM

## 2024-05-28 DIAGNOSIS — M5441 Lumbago with sciatica, right side: Secondary | ICD-10-CM | POA: Diagnosis not present

## 2024-05-28 DIAGNOSIS — F419 Anxiety disorder, unspecified: Secondary | ICD-10-CM

## 2024-05-28 DIAGNOSIS — L732 Hidradenitis suppurativa: Secondary | ICD-10-CM

## 2024-05-28 MED ORDER — METFORMIN HCL ER 500 MG PO TB24: 500.0000 mg | ORAL_TABLET | Freq: Every day | ORAL | 3 refills | Status: DC

## 2024-05-28 NOTE — Progress Notes (Signed)
 Established patient visit   Patient: Amber Sanders   DOB: [birth year]   38 y.o. Female  MRN: [id number] Visit Date: 05/28/2024  Today's healthcare provider: Isaiah DELENA Pepper, MD   Chief Complaint  Patient presents with   Follow-up    1 month f/u ( No Other concerns)   Subjective    HPI  Discussed the use of AI scribe software for clinical note transcription with the patient, who gave verbal consent to proceed.  History of Present Illness Amber Sanders is a 38 year old female who presents for follow-up regarding her mental health, weight management, and other ongoing health issues.  She has experienced significant improvement in her mental health since the last visit, with no major panic attacks and no need for panic attack medication. She finds the new virtual therapy sessions comfortable and convenient.  Regarding weight management, she was unable to start Wegovy due to its high cost despite insurance approval. She continues to feel depressed about her weight, despite watching her diet and maintaining daily walks. She has been trying to lose weight for years without success and has not tried metformin before.  She continues to take doxycycline  for her hidradenitis suppurativa, which has been effective in reducing outbreaks. She has also switched to using Dove antibacterial body wash, which she feels has helped. She has not yet pursued further dermatological options as she wanted to assess the effectiveness of doxycycline  first.  She reports persistent swelling in her feet and ankles, ongoing for six to seven months. The swelling occurs regardless of activity level or type of footwear and is painful, especially when walking. It worsens throughout the day and improves overnight with elevation. Initially associated with prolonged standing and back and hip pain, the swelling now occurs more consistently.  She experiences chronic back and hip pain, worsening over the  years, with pain radiating down her right leg and affecting her lower back. This makes it difficult to stand still or perform certain movements. Previous physical therapy in [place]  provided temporary relief. Cold weather exacerbates her symptoms, causing stiffness and increased pain.  Medications: Outpatient Medications Prior to Visit  Medication Sig   doxycycline  (ADOXA) 100 MG tablet Take 1 tablet (100 mg total) by mouth daily.   escitalopram  (LEXAPRO ) 10 MG tablet TAKE 1 TABLET BY MOUTH EVERY DAY   propranolol  (INDERAL ) 10 MG tablet Take 1 tablet (10 mg total) by mouth 3 (three) times daily as needed (panic attacks).   [DISCONTINUED] semaglutide-weight management (WEGOVY) 0.5 MG/0.5ML SOAJ SQ injection Inject 0.5 mg into the skin once a week. Start after 0.25mg  dose.   No facility-administered medications prior to visit.    Review of Systems as noted in HPI.      Objective    Pulse 98   Resp 16   Wt (!) 352 lb (159.7 kg)   SpO2 100%   BMI 56.81 kg/m    Physical Exam Constitutional:      Appearance: Normal appearance.  HENT:     Head: Normocephalic and atraumatic.     Mouth/Throat:     Mouth: Mucous membranes are moist.  Eyes:     Pupils: Pupils are equal, round, and reactive to light.  Pulmonary:     Effort: Pulmonary effort is normal.  Musculoskeletal:     Comments: 1+ edema in bilateral ankles and feet  Skin:    General: Skin is warm.  Neurological:     General: No focal deficit present.  Mental Status: She is alert.      No results found for any visits on 05/28/24.  Assessment & Plan     Problem List Items Addressed This Visit       Respiratory   OSA (obstructive sleep apnea)     Nervous and Auditory   Chronic midline low back pain with bilateral sciatica   Relevant Orders   Ambulatory referral to Physical Therapy     Musculoskeletal and Integument   Hidradenitis suppurativa     Other   Current severe episode of major depressive disorder  without psychotic features without prior episode (HCC)   Anxiety   Morbid obesity (HCC) - Primary   Relevant Medications   metFORMIN (GLUCOPHAGE-XR) 500 MG 24 hr tablet   Iron deficiency   Prediabetes   Relevant Medications   metFORMIN (GLUCOPHAGE-XR) 500 MG 24 hr tablet   Bilateral lower extremity edema   Relevant Orders   TSH + free T4   Comprehensive metabolic panel with GFR   Brain natriuretic peptide   Assessment & Plan Morbid obesity with prediabetes BMI 56. Unable to afford Wegovy. Metformin discussed as a cost-effective alternative for weight loss and pre-diabetes. - Start metformin 500 mg daily, increase to 1000 mg if tolerated after one week. - Ordered blood work to check thyroid , kidney function, liver function, and electrolytes. - Consider referral to weight management  Obstructive sleep apnea Scheduled for a sleep study due to difficulty sleeping and apnea when supine. - Proceed with scheduled sleep study.  Depression, in remission Anxiety Chronic, improved since starting Lexapro  and propranolol . - Continue current medications including Lexapro  and Propranolol . - Continue therapy  Hidradenitis suppurativa Managed with doxycycline  and Dove antibacterial body wash. Improvement noted. Discussed potential advanced therapies. - Continue doxycycline  and Dove antibacterial body wash. - Encouraged patient to see dermatology  Iron deficiency anemia Iron deficiency anemia managed with supplements. - Continue iron supplementation.  Localized edema of feet and ankles Chronic swelling with differential including dependent edema, kidney issues, heart failure, chronic venous insufficiency, and thyroid  dysfunction. No pitting edema observed. - Ordered blood work to assess thyroid , kidney function, liver function, and electrolytes. - Ordered blood test to evaluate heart function.  Chronic low back pain with sciatica and hip pain Chronic pain with radiation down the leg,  exacerbated by movement. PT previously helpful in the past. - Referred to physical therapy for core strengthening and pain management. - Encourage ice, gentle stretching, NSAIDs PRN - Will consider gabapentin if physical therapy is insufficient.   Return in about 3 months (around 08/28/2024) for Follow Up Weight.       Isaiah DELENA Pepper, MD  [facility] [phone] (phone) [phone] (fax)

## 2024-05-29 ENCOUNTER — Telehealth: Payer: Self-pay

## 2024-05-29 NOTE — Telephone Encounter (Signed)
 I contacted Amber Sanders and LVM informing her of her appt details

## 2024-05-30 ENCOUNTER — Ambulatory Visit: Payer: Self-pay

## 2024-05-30 LAB — COMPREHENSIVE METABOLIC PANEL WITH GFR
ALT: 16 IU/L (ref 0–32)
AST: 16 IU/L (ref 0–40)
Albumin: 3.8 g/dL — ABNORMAL LOW (ref 3.9–4.9)
Alkaline Phosphatase: 107 IU/L (ref 41–116)
BUN/Creatinine Ratio: 11 (ref 9–23)
BUN: 10 mg/dL (ref 6–20)
Bilirubin Total: <0.2 mg/dL (ref 0.0–1.2)
CO2: 21 mmol/L (ref 20–29)
Calcium: 9.3 mg/dL (ref 8.7–10.2)
Chloride: 103 mmol/L (ref 96–106)
Creatinine, Ser: 0.87 mg/dL (ref 0.57–1.00)
Globulin, Total: 3.4 g/dL (ref 1.5–4.5)
Glucose: 122 mg/dL — ABNORMAL HIGH (ref 70–99)
Potassium: 3.6 mmol/L (ref 3.5–5.2)
Sodium: 136 mmol/L (ref 134–144)
Total Protein: 7.2 g/dL (ref 6.0–8.5)
eGFR: 87 mL/min/1.73 (ref >59)

## 2024-05-30 LAB — TSH+FREE T4
Free T4: 1.11 ng/dL (ref 0.82–1.77)
TSH: 1.17 u[IU]/mL (ref 0.450–4.500)

## 2024-05-30 LAB — BRAIN NATRIURETIC PEPTIDE: BNP: <2.5 pg/mL (ref 0.0–100.0)

## 2024-06-05 ENCOUNTER — Ambulatory Visit (INDEPENDENT_AMBULATORY_CARE_PROVIDER_SITE_OTHER): Payer: Self-pay | Admitting: Sleep Medicine

## 2024-06-05 ENCOUNTER — Encounter: Payer: Self-pay | Admitting: Sleep Medicine

## 2024-06-05 VITALS — BP 132/82 | HR 102 | Temp 98.1°F | Ht 66.0 in | Wt 349.2 lb

## 2024-06-05 DIAGNOSIS — G4733 Obstructive sleep apnea (adult) (pediatric): Secondary | ICD-10-CM | POA: Diagnosis not present

## 2024-06-05 NOTE — Progress Notes (Signed)
 Name:Amber Sanders MRN: [id number] DOB: [birth year]   CHIEF COMPLAINT:  EXCESSIVE DAYTIME SLEEPINESS   HISTORY OF PRESENT ILLNESS: Amber Sanders is a 38 y.o. w/ a h/o anxiety and morbid obesity who presents for c/o loud snoring, witnessed apnea and excessive daytime sleepiness which has been present for several years. Reports nocturnal awakenings due to unclear reasons and occasionally has difficulty falling back to sleep. Reports significant weight changes. Admits to dry mouth and morning headaches. Denies RLS symptoms, dream enactment, cataplexy, hypnagogic or hypnapompic hallucinations. Denies a family history of sleep apnea. Denies drowsy driving. Drinks 1 cup of coffee daily, denies alcohol, tobacco or illicit drug use.   Bedtime 9:30-10:30 pm Sleep onset 15 mins Rise time 6:45-8:30 am   EPWORTH SLEEP SCORE 11    06/05/2024    3:54 PM  Results of the Epworth flowsheet  Sitting and reading 2  Watching TV 3  Sitting, inactive in a public place (e.g. a theatre or a meeting) 0  As a passenger in a car for an hour without a break 2  Lying down to rest in the afternoon when circumstances permit 2  Sitting and talking to someone 1  Sitting quietly after a lunch without alcohol 1  In a car, while stopped for a few minutes in traffic 0  Total score 11    PAST MEDICAL HISTORY :   has a past medical history of Anemia, Anemia, Chlamydia, Hypertension, Pregnancy induced hypertension (07/14/2014), Sinus headache, Syphilis, and Trichomonas.  has a past surgical history that includes Dilation and evacuation (07/16/2012) and Abscess drainage. Prior to Admission medications   Medication Sig Start Date End Date Taking? Authorizing Provider  doxycycline  (ADOXA) 100 MG tablet Take 1 tablet (100 mg total) by mouth daily. 04/02/24   Franchot Isaiah LABOR, MD  escitalopram  (LEXAPRO ) 10 MG tablet TAKE 1 TABLET BY MOUTH EVERY DAY 04/24/24   Franchot Isaiah LABOR, MD  metFORMIN (GLUCOPHAGE-XR) 500 MG  24 hr tablet Take 1 tablet (500 mg total) by mouth daily with breakfast. 05/28/24   Franchot Isaiah LABOR, MD  propranolol  (INDERAL ) 10 MG tablet Take 1 tablet (10 mg total) by mouth 3 (three) times daily as needed (panic attacks). 04/02/24   Franchot Isaiah LABOR, MD   Allergies  Allergen Reactions   Coconut (Cocos Nucifera) Hives, Itching, Shortness Of Breath and Swelling   Coconut Oil Hives and Rash   Shellfish Allergy Anaphylaxis   Shellfish Protein-Containing Drug Products Anaphylaxis, Dermatitis, Hives, Nausea And Vomiting, Shortness Of Breath and Swelling    FAMILY HISTORY:  family history includes Breast cancer in her mother; Cancer in her father; Diabetes in her mother. SOCIAL HISTORY:  reports that she has never smoked. She has never used smokeless tobacco. She reports that she does not drink alcohol and does not use drugs.   Review of Systems:  Gen:  Denies  fever, sweats, chills weight loss  HEENT: Denies blurred vision, double vision, ear pain, eye pain, hearing loss, nose bleeds, sore throat Cardiac:  No dizziness, chest pain or heaviness, chest tightness,edema, No JVD Resp:   No cough, -sputum production, -shortness of breath,-wheezing, -hemoptysis,  Gi: Denies swallowing difficulty, stomach pain, nausea or vomiting, diarrhea, constipation, bowel incontinence Gu:  Denies bladder incontinence, burning urine Ext:   Denies Joint pain, stiffness or swelling Skin: Denies  skin rash, easy bruising or bleeding or hives Endoc:  Denies polyuria, polydipsia , polyphagia or weight change Psych:   Denies depression, insomnia or hallucinations  Other:  All other systems negative  VITAL SIGNS: BP 132/82   Pulse (!) 102   Temp 98.1 F (36.7 C)   Ht 5' 6 (1.676 m)   Wt (!) 349 lb 3.2 oz (158.4 kg)   SpO2 98%   BMI 56.36 kg/m    Physical Examination:   General Appearance: No distress  EYES PERRLA, EOM intact.   NECK Supple, No JVD Pulmonary: normal breath sounds, No wheezing.   CardiovascularNormal S1,S2.  No m/r/g.   Abdomen: Benign, Soft, non-tender. Skin:   warm, no rashes, no ecchymosis  Extremities: normal, no cyanosis, clubbing. Neuro:without focal findings,  speech normal  PSYCHIATRIC: Mood, affect within normal limits.   ASSESSMENT AND PLAN  OSA I suspect that OSA is likely present due to clinical presentation. Discussed the consequences of untreated sleep apnea. Advised not to drive drowsy for safety of patient and others. Will complete further evaluation with a home sleep study and follow up to review results.    Morbid obesity Counseled patient on diet and lifestyle modification.    MEDICATION ADJUSTMENTS/LABS AND TESTS ORDERED: Recommend Sleep Study   Patient  satisfied with Plan of action and management. All questions answered  Follow up to review HST results and treatment plan.   I spent a total of 30 minutes reviewing chart data, face-to-face evaluation with the patient, counseling and coordination of care as detailed above.    Crystall Donaldson, M.D.  Sleep Medicine [facility]

## 2024-06-05 NOTE — Patient Instructions (Addendum)
 SABRA

## 2024-06-07 ENCOUNTER — Other Ambulatory Visit
Admission: RE | Admit: 2024-06-07 | Discharge: 2024-06-07 | Disposition: A | Discharge status: Home / Self Care | Payer: Self-pay | Source: Ambulatory Visit | Attending: Medical Genetics | Admitting: Medical Genetics

## 2024-06-18 ENCOUNTER — Other Ambulatory Visit: Payer: Self-pay

## 2024-06-18 DIAGNOSIS — G4733 Obstructive sleep apnea (adult) (pediatric): Secondary | ICD-10-CM

## 2024-06-23 LAB — GENECONNECT MOLECULAR SCREEN: Genetic Analysis Overall Interpretation: NEGATIVE

## 2024-06-24 DIAGNOSIS — R7303 Prediabetes: Secondary | ICD-10-CM

## 2024-06-24 NOTE — Telephone Encounter (Signed)
 Noted, thank you!

## 2024-06-24 NOTE — Telephone Encounter (Signed)
 This sounds like an FYI, so forwarding to PCP.  CMAs - please update patient reported med dosages on the med list, though.

## 2024-06-30 ENCOUNTER — Ambulatory Visit: Payer: Self-pay

## 2024-06-30 DIAGNOSIS — Z3009 Encounter for other general counseling and advice on contraception: Secondary | ICD-10-CM

## 2024-06-30 DIAGNOSIS — Z3042 Encounter for surveillance of injectable contraceptive: Secondary | ICD-10-CM

## 2024-06-30 LAB — POCT URINE PREGNANCY: Preg Test, Ur: NEGATIVE

## 2024-06-30 MED ORDER — MEDROXYPROGESTERONE ACETATE 150 MG/ML IM SUSP
150.0000 mg | Freq: Once | INTRAMUSCULAR | Status: AC
  Administered 2024-06-30: 150 mg via INTRAMUSCULAR

## 2024-06-30 NOTE — Progress Notes (Signed)
 Nurse visit only. Urine pregnancy negative. Depo shot given.

## 2024-07-03 ENCOUNTER — Encounter: Payer: Self-pay | Admitting: Sleep Medicine

## 2024-07-14 ENCOUNTER — Encounter

## 2024-07-14 DIAGNOSIS — G4733 Obstructive sleep apnea (adult) (pediatric): Secondary | ICD-10-CM

## 2024-07-14 DIAGNOSIS — R069 Unspecified abnormalities of breathing: Secondary | ICD-10-CM | POA: Diagnosis not present

## 2024-07-16 ENCOUNTER — Ambulatory Visit: Payer: Self-pay

## 2024-07-16 DIAGNOSIS — G4733 Obstructive sleep apnea (adult) (pediatric): Secondary | ICD-10-CM

## 2024-07-21 ENCOUNTER — Other Ambulatory Visit: Payer: Self-pay

## 2024-07-21 DIAGNOSIS — R7303 Prediabetes: Secondary | ICD-10-CM

## 2024-07-21 MED ORDER — METFORMIN HCL ER 500 MG PO TB24: 1000.0000 mg | ORAL_TABLET | Freq: Every day | ORAL | 3 refills | Status: AC

## 2024-07-21 NOTE — Telephone Encounter (Signed)
"  Refill sent  "

## 2024-07-23 ENCOUNTER — Ambulatory Visit: Payer: Self-pay

## 2024-07-23 DIAGNOSIS — G4733 Obstructive sleep apnea (adult) (pediatric): Secondary | ICD-10-CM

## 2024-08-04 ENCOUNTER — Telehealth: Admitting: Family Medicine

## 2024-08-04 DIAGNOSIS — J4540 Moderate persistent asthma, uncomplicated: Secondary | ICD-10-CM

## 2024-08-04 MED ORDER — DOXYCYCLINE HYCLATE 100 MG PO TABS: 100.0000 mg | ORAL_TABLET | Freq: Two times a day (BID) | ORAL | 0 refills | Status: DC

## 2024-08-04 MED ORDER — PREDNISONE 20 MG PO TABS: 20.0000 mg | ORAL_TABLET | Freq: Two times a day (BID) | ORAL | 0 refills | Status: AC

## 2024-08-04 NOTE — Progress Notes (Signed)
 " Virtual Visit Consent   Amber Sanders, you are scheduled for a virtual visit with a [facility] provider today. Just as with appointments in the office, your consent must be obtained to participate. Your consent will be active for this visit and any virtual visit you may have with one of our providers in the next 365 days. If you have a MyChart account, a copy of this consent can be sent to you electronically.  As this is a virtual visit, video technology does not allow for your provider to perform a traditional examination. This may limit your provider's ability to fully assess your condition. If your provider identifies any concerns that need to be evaluated in person or the need to arrange testing (such as labs, EKG, etc.), we will make arrangements to do so. Although advances in technology are sophisticated, we cannot ensure that it will always work on either your end or our end. If the connection with a video visit is poor, the visit may have to be switched to a telephone visit. With either a video or telephone visit, we are not always able to ensure that we have a secure connection.  By engaging in this virtual visit, you consent to the provision of healthcare and authorize for your insurance to be billed (if applicable) for the services provided during this visit. Depending on your insurance coverage, you may receive a charge related to this service.  I need to obtain your verbal consent now. Are you willing to proceed with your visit today? Amber Sanders has provided verbal consent on 08/04/2024 for a virtual visit (video or telephone). Loa Lamp, FNP  Date: 08/04/2024 5:21 PM   Virtual Visit via Video Note   I, Loa Lamp, connected with  Amber Sanders  ([id number], 39-Jul-1987) on 08/04/2024 at  5:15 PM EST by a video-enabled telemedicine application and verified that I am speaking with the correct person using two identifiers.  Location: Patient: Virtual Visit Location Patient:  Home Provider: Virtual Visit Location Provider: Home Office   I discussed the limitations of evaluation and management by telemedicine and the availability of in person appointments. The patient expressed understanding and agreed to proceed.    History of Present Illness: Amber Sanders is a 39 y.o. who identifies as a female who was assigned female at birth, and is being seen today for sinus pain and pressure, hoarse, post nasal drainage, cough, wheezing and sob. History of asthmatic bronchitis. No fever, chills but has had body aches. Sx for 3-4 days worsening. She is prediabetic with sleep apnea.  HPI: HPI  Problems:  Patient Active Problem List   Diagnosis Date Noted   Chronic midline low back pain with bilateral sciatica 05/28/2024   Bilateral lower extremity edema 05/28/2024   OSA (obstructive sleep apnea) 04/23/2024   Iron deficiency 04/23/2024   Prediabetes 04/23/2024   Current severe episode of major depressive disorder without psychotic features without prior episode (HCC) 04/02/2024   Anxiety 04/02/2024   PTSD (post-traumatic stress disorder) 04/02/2024   Birth control counseling 04/02/2024   Hidradenitis suppurativa 04/02/2024   Morbid obesity (HCC) 04/02/2024   Pelvic pain 03/28/2020   Subserous leiomyoma of uterus 03/28/2020   Depo-Provera  contraceptive status 03/28/2020    Allergies: Allergies[1] Medications: Current Medications[2]  Observations/Objective: Patient is well-developed, well-nourished in no acute distress.  Resting comfortably  at home.  Head is normocephalic, atraumatic.  No labored breathing.  Speech is clear and coherent with logical content.  Patient is  alert and oriented at baseline.    Assessment and Plan: 1. Moderate persistent asthmatic bronchitis without complication (Primary)  Increase fluids, humidifier at night tylenol , hold antibiotic and start in 2-3 days if sx persist or worsen.   Follow Up Instructions: I discussed the  assessment and treatment plan with the patient. The patient was provided an opportunity to ask questions and all were answered. The patient agreed with the plan and demonstrated an understanding of the instructions.  A copy of instructions were sent to the patient via MyChart unless otherwise noted below.     The patient was advised to call back or seek an in-person evaluation if the symptoms worsen or if the condition fails to improve as anticipated.    Jani Moronta, FNP     [1]  Allergies Allergen Reactions   Coconut (Cocos Nucifera) Hives, Itching, Shortness Of Breath and Swelling   Coconut Oil Hives and Rash   Shellfish Allergy Anaphylaxis   Shellfish Protein-Containing Drug Products Anaphylaxis, Dermatitis, Hives, Nausea And Vomiting, Shortness Of Breath and Swelling  [2]  Current Outpatient Medications:    doxycycline  (VIBRA -TABS) 100 MG tablet, Take 1 tablet (100 mg total) by mouth 2 (two) times daily for 10 days., Disp: 20 tablet, Rfl: 0   predniSONE  (DELTASONE ) 20 MG tablet, Take 1 tablet (20 mg total) by mouth 2 (two) times daily with a meal for 5 days., Disp: 10 tablet, Rfl: 0   doxycycline  (ADOXA) 100 MG tablet, Take 1 tablet (100 mg total) by mouth daily., Disp: 30 tablet, Rfl: 3   escitalopram  (LEXAPRO ) 10 MG tablet, TAKE 1 TABLET BY MOUTH EVERY DAY, Disp: 90 tablet, Rfl: 2   ferrous sulfate  325 (65 FE) MG tablet, Take 325 mg by mouth daily., Disp: , Rfl:    metFORMIN  (GLUCOPHAGE -XR) 500 MG 24 hr tablet, Take 2 tablets (1,000 mg total) by mouth daily with breakfast., Disp: 180 tablet, Rfl: 3   propranolol  (INDERAL ) 10 MG tablet, Take 1 tablet (10 mg total) by mouth 3 (three) times daily as needed (panic attacks)., Disp: 30 tablet, Rfl: 2  "

## 2024-08-04 NOTE — Patient Instructions (Signed)

## 2024-08-05 ENCOUNTER — Telehealth

## 2024-08-05 DIAGNOSIS — J069 Acute upper respiratory infection, unspecified: Secondary | ICD-10-CM | POA: Diagnosis not present

## 2024-08-05 NOTE — Progress Notes (Signed)
 "   MyChart Video Visit    Virtual Visit via Video Note   This format is felt to be most appropriate for this patient at this time. Physical exam was limited by quality of the video and audio technology used for the visit.    Patient location: home Provider location: [facility] Persons involved in the visit: patient, provider  I discussed the limitations of evaluation and management by telemedicine and the availability of in person appointments. The patient expressed understanding and agreed to proceed.  Patient: Amber Sanders   DOB: 1986-02-21   39 y.o. Female  MRN: [id number] Visit Date: 08/05/2024  Today's healthcare provider: Isaiah DELENA Pepper, MD   No chief complaint on file.  Subjective    HPI   Discussed the use of AI scribe software for clinical note transcription with the patient, who gave verbal consent to proceed.  History of Present Illness Amber Sanders is a 39 year old female who presents with hoarseness, sneezing, and coughing.  Her symptoms began on Friday night with congestion, followed by sneezing and coughing on Saturday morning. The symptoms have progressively worsened since then. She describes experiencing sinus pain and pressure for two days, which is now resolved. The symptoms tend to worsen in the early morning and late at night when it is cold outside.  She was seen virtually by another provider the day before this visit and was prescribed prednisone  and doxycycline . However, she is already taking doxycycline  for hidradenitis suppurativa, so she only started prednisone . She has been using Mucinex, cough drops, and throat spray to manage her symptoms.  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 She also notes a little wheezing and chest tightness. Mild sore throat.   Review of systems as noted in HPI.      Objective    There were no vitals  taken for this visit.      Physical Exam Constitutional:      Appearance: Normal appearance.  HENT:     Head: Normocephalic.  Eyes:     Pupils: Pupils are equal, round, and reactive to light.  Pulmonary:     Effort: Pulmonary effort is normal.     Comments: Voice is hoarse Neurological:     General: No focal deficit present.     Mental Status: She is alert.        Assessment & Plan     Problem List Items Addressed This Visit       Respiratory   Acute URI - Primary   Assessment & Plan Acute upper respiratory infection Patient with several day hx of cough, congestion, and hoarseness. Seen by virtual provider yesterday and prescribed prednisone . Likely viral etiology. Wheezing suggests possible asthmatic bronchitis. - Continue prednisone . - Use Mucinex for cough. - Use cough drops and throat spray for sore throat. - Drink warm liquids with honey. - Schedule follow-up if no improvement.   No orders of the defined types were placed in this encounter.    No follow-ups on file.     I discussed the assessment and treatment plan with the patient. The patient was provided an opportunity to ask questions and all were answered. The patient agreed with the plan and demonstrated an understanding of the instructions.   The patient was advised to call back or seek an in-person evaluation if the symptoms worsen or if the condition fails to improve as anticipated.  Isaiah DELENA Pepper,  MD [facility] [phone] (phone) [phone] (fax) "

## 2024-08-11 ENCOUNTER — Other Ambulatory Visit: Payer: Self-pay | Admitting: Sleep Medicine

## 2024-08-11 DIAGNOSIS — G4733 Obstructive sleep apnea (adult) (pediatric): Secondary | ICD-10-CM

## 2024-08-11 MED ORDER — ZEPBOUND 2.5 MG/0.5ML ~~LOC~~ SOAJ: 2.5000 mg | SUBCUTANEOUS | 0 refills | Status: DC

## 2024-08-11 NOTE — Telephone Encounter (Signed)
 Zepbound  order placed

## 2024-08-11 NOTE — Addendum Note (Signed)
 Addended by: Markeria Goetsch J on: 08/11/2024 03:16 PM   Modules accepted: Orders

## 2024-08-11 NOTE — Telephone Encounter (Signed)
 PA has been requested.

## 2024-08-15 ENCOUNTER — Other Ambulatory Visit (HOSPITAL_COMMUNITY): Payer: Self-pay

## 2024-08-15 ENCOUNTER — Telehealth: Payer: Self-pay

## 2024-08-15 NOTE — Telephone Encounter (Signed)
 Pharmacy Patient Advocate Encounter   Received notification from RX Request Messages that prior authorization for Zepbound  2.5mg /0.59ml is required/requested.   Insurance verification completed.   The patient is insured through [facility].   Per test claim:  Wegovy  is preferred by the insurance.  If suggested medication is appropriate, Please send in a new RX and discontinue this one. If not, please advise as to why it's not appropriate so that we may request a Prior Authorization. Please note, some preferred medications may still require a PA.  If the suggested medications have not been trialed and there are no contraindications to their use, the PA will not be submitted, as it will not be approved. Archived Key: [id number]  Tried a test claim for the Wegovy  and the copay is $1,311.54 ($1,311.54 applied to deductible and $1.093.33 remaining). The PA is good until 11/19/24.  The patient also has a medicaid card which it may help with the copay if approved with a PA.   Please advise

## 2024-08-20 ENCOUNTER — Other Ambulatory Visit (HOSPITAL_COMMUNITY): Payer: Self-pay

## 2024-08-29 ENCOUNTER — Ambulatory Visit

## 2024-08-29 VITALS — BP 136/88 | HR 90 | Temp 97.7°F | Wt 349.0 lb

## 2024-08-29 DIAGNOSIS — Z23 Encounter for immunization: Secondary | ICD-10-CM

## 2024-08-29 DIAGNOSIS — F322 Major depressive disorder, single episode, severe without psychotic features: Secondary | ICD-10-CM

## 2024-08-29 DIAGNOSIS — Z1159 Encounter for screening for other viral diseases: Secondary | ICD-10-CM

## 2024-08-29 DIAGNOSIS — L732 Hidradenitis suppurativa: Secondary | ICD-10-CM

## 2024-08-29 DIAGNOSIS — F419 Anxiety disorder, unspecified: Secondary | ICD-10-CM

## 2024-08-29 MED ORDER — ESCITALOPRAM OXALATE 10 MG PO TABS: 10.0000 mg | ORAL_TABLET | Freq: Every day | ORAL | 3 refills | Status: AC

## 2024-08-29 NOTE — Patient Instructions (Addendum)
 Pneumococcal Conjugate Vaccine: What You Need to Know (CDC VIS) Many Vaccine Information Statements are available in Spanish and other languages. See swimconditioning.hu To view this CDC Vaccine Information Statement in English, click the link below or scan the QR code: https://pe.elsevier.com/sJ2OwaGU  This information is not intended to replace advice given to you by your health care provider. Make sure you discuss any questions you have with your health care provider. Vaccine Information Statements (VISs) are third - party content from [facility] & Immunize.org and Elsevier is not responsible for the content.     Immunize.org Market researcher.     Elsevier Patient Education  2026 [employer].  Tdap (Tetanus, Diphtheria, Pertussis) Vaccine: What You Need to Know (CDC VIS) Many Vaccine Information Statements are available in Spanish and other languages. See http://www.mcdowell.com/ To view this CDC Vaccine Information Statement in English, click the link below or scan the QR code: https://pe.elsevier.com/3aMDiJgl  This information is not intended to replace advice given to you by your health care provider. Make sure you discuss any questions you have with your health care provider. Vaccine Information Statements (VISs) are third - party content from [facility] & Immunize.org and Elsevier is not responsible for the content.     Immunize.org Market researcher.     Elsevier Patient Education  2026 [employer].

## 2024-08-29 NOTE — Progress Notes (Unsigned)
" °  ° ° °  Established patient visit   Patient: Amber Sanders   DOB: May 28, 1986   39 y.o. Female  MRN: [id number] Visit Date: 08/29/2024  Today's healthcare provider: Isaiah DELENA Pepper, MD   Chief Complaint  Patient presents with   Weight Check   Medication Problem    Zepbound  insurance would not approve   Subjective    HPI  Discussed the use of AI scribe software for clinical note transcription with the patient, who gave verbal consent to proceed.  History of Present Illness      Medications: Show/hide medication list[1]  Review of Systems as noted in HPI.  {Insert previous labs (optional):23779} {See past labs  Heme  Chem  Endocrine  Serology  Results Review (optional):1}   Objective    There were no vitals taken for this visit. {Insert last BP/Wt (optional):23777}{See vitals history (optional):1}  Physical Exam   No results found for any visits on 08/29/24.  Assessment & Plan     Problem List Items Addressed This Visit       Musculoskeletal and Integument   Hidradenitis suppurativa     Other   Current severe episode of major depressive disorder without psychotic features without prior episode [facility])    Assessment & Plan     Wt Readings from Last 3 Encounters:  08/29/24 (!) 349 lb (158.3 kg)  06/05/24 (!) 349 lb 3.2 oz (158.4 kg)  05/28/24 (!) 352 lb (159.7 kg)     No follow-ups on file.       Isaiah DELENA Pepper, MD  [facility] [phone] (phone) [phone] (fax)    [1]  Outpatient Medications Prior to Visit  Medication Sig   doxycycline  (ADOXA) 100 MG tablet Take 1 tablet (100 mg total) by mouth daily.   escitalopram  (LEXAPRO ) 10 MG tablet TAKE 1 TABLET BY MOUTH EVERY DAY   ferrous sulfate  325 (65 FE) MG tablet Take 325 mg by mouth daily.   metFORMIN  (GLUCOPHAGE -XR) 500 MG 24 hr tablet Take 2 tablets (1,000 mg total) by mouth daily with breakfast.   propranolol  (INDERAL ) 10 MG tablet Take 1 tablet (10  mg total) by mouth 3 (three) times daily as needed (panic attacks).   tirzepatide  (ZEPBOUND ) 2.5 MG/0.5ML Pen Inject 2.5 mg into the skin once a week. (Patient not taking: Reported on 08/29/2024)   No facility-administered medications prior to visit.   "

## 2024-11-26 ENCOUNTER — Ambulatory Visit
